# Patient Record
Sex: Female | Born: 1965 | Race: Black or African American | Hispanic: No | Marital: Married | Smoking: Never smoker
Health system: Southern US, Community
[De-identification: ages and names within clinical notes are randomized; demographics above are authoritative.]

## PROBLEM LIST (undated history)

## (undated) DIAGNOSIS — E876 Hypokalemia: Secondary | ICD-10-CM

## (undated) DIAGNOSIS — G43909 Migraine, unspecified, not intractable, without status migrainosus: Secondary | ICD-10-CM

## (undated) DIAGNOSIS — G250 Essential tremor: Secondary | ICD-10-CM

## (undated) DIAGNOSIS — E119 Type 2 diabetes mellitus without complications: Secondary | ICD-10-CM

## (undated) DIAGNOSIS — I10 Essential (primary) hypertension: Secondary | ICD-10-CM

## (undated) DIAGNOSIS — E785 Hyperlipidemia, unspecified: Secondary | ICD-10-CM

## (undated) HISTORY — PX: EXTERNAL EAR SURGERY: SHX627

## (undated) HISTORY — DX: Hyperlipidemia, unspecified: E78.5

## (undated) HISTORY — DX: Essential tremor: G25.0

## (undated) HISTORY — DX: Hypokalemia: E87.6

## (undated) HISTORY — DX: Migraine, unspecified, not intractable, without status migrainosus: G43.909

## (undated) HISTORY — DX: Type 2 diabetes mellitus without complications: E11.9

---

## 2004-10-10 ENCOUNTER — Emergency Department (HOSPITAL_COMMUNITY): Admission: EM | Admit: 2004-10-10 | Discharge: 2004-10-10 | Payer: Self-pay | Admitting: Emergency Medicine

## 2004-11-10 ENCOUNTER — Ambulatory Visit: Payer: Self-pay | Admitting: Family Medicine

## 2005-01-25 ENCOUNTER — Ambulatory Visit: Payer: Self-pay | Admitting: Family Medicine

## 2005-04-23 ENCOUNTER — Emergency Department (HOSPITAL_COMMUNITY): Admission: EM | Admit: 2005-04-23 | Discharge: 2005-04-24 | Payer: Self-pay | Admitting: Emergency Medicine

## 2005-05-19 ENCOUNTER — Ambulatory Visit: Payer: Self-pay | Admitting: Family Medicine

## 2005-11-10 ENCOUNTER — Ambulatory Visit: Payer: Self-pay | Admitting: Family Medicine

## 2005-11-23 ENCOUNTER — Ambulatory Visit: Payer: Self-pay | Admitting: Family Medicine

## 2005-12-07 ENCOUNTER — Ambulatory Visit (HOSPITAL_COMMUNITY): Admission: RE | Admit: 2005-12-07 | Discharge: 2005-12-07 | Payer: Self-pay | Admitting: Internal Medicine

## 2005-12-07 ENCOUNTER — Encounter (INDEPENDENT_AMBULATORY_CARE_PROVIDER_SITE_OTHER): Payer: Self-pay | Admitting: Cardiology

## 2005-12-15 ENCOUNTER — Ambulatory Visit: Payer: Self-pay | Admitting: Family Medicine

## 2006-01-09 ENCOUNTER — Observation Stay (HOSPITAL_COMMUNITY): Admission: EM | Admit: 2006-01-09 | Discharge: 2006-01-10 | Payer: Self-pay | Admitting: *Deleted

## 2006-01-09 ENCOUNTER — Ambulatory Visit: Payer: Self-pay | Admitting: Internal Medicine

## 2006-01-19 ENCOUNTER — Ambulatory Visit (HOSPITAL_COMMUNITY): Admission: RE | Admit: 2006-01-19 | Discharge: 2006-01-19 | Payer: Self-pay | Admitting: Cardiology

## 2006-01-29 ENCOUNTER — Emergency Department (HOSPITAL_COMMUNITY): Admission: EM | Admit: 2006-01-29 | Discharge: 2006-01-29 | Payer: Self-pay | Admitting: Emergency Medicine

## 2006-03-05 ENCOUNTER — Emergency Department (HOSPITAL_COMMUNITY): Admission: EM | Admit: 2006-03-05 | Discharge: 2006-03-05 | Payer: Self-pay | Admitting: Podiatry

## 2006-03-23 ENCOUNTER — Ambulatory Visit: Payer: Self-pay | Admitting: *Deleted

## 2006-03-23 ENCOUNTER — Encounter (INDEPENDENT_AMBULATORY_CARE_PROVIDER_SITE_OTHER): Payer: Self-pay | Admitting: *Deleted

## 2006-04-19 ENCOUNTER — Ambulatory Visit (HOSPITAL_COMMUNITY): Admission: RE | Admit: 2006-04-19 | Discharge: 2006-04-19 | Payer: Self-pay | Admitting: Internal Medicine

## 2006-05-12 ENCOUNTER — Ambulatory Visit: Payer: Self-pay | Admitting: Obstetrics & Gynecology

## 2006-10-25 ENCOUNTER — Emergency Department (HOSPITAL_COMMUNITY): Admission: EM | Admit: 2006-10-25 | Discharge: 2006-10-25 | Payer: Self-pay | Admitting: Emergency Medicine

## 2006-11-25 ENCOUNTER — Emergency Department (HOSPITAL_COMMUNITY): Admission: EM | Admit: 2006-11-25 | Discharge: 2006-11-26 | Payer: Self-pay | Admitting: Emergency Medicine

## 2006-12-22 ENCOUNTER — Emergency Department (HOSPITAL_COMMUNITY): Admission: EM | Admit: 2006-12-22 | Discharge: 2006-12-22 | Payer: Self-pay | Admitting: Emergency Medicine

## 2007-03-14 ENCOUNTER — Emergency Department (HOSPITAL_COMMUNITY): Admission: EM | Admit: 2007-03-14 | Discharge: 2007-03-14 | Payer: Self-pay | Admitting: Emergency Medicine

## 2007-03-28 ENCOUNTER — Emergency Department (HOSPITAL_COMMUNITY): Admission: EM | Admit: 2007-03-28 | Discharge: 2007-03-28 | Payer: Self-pay | Admitting: Emergency Medicine

## 2007-05-02 ENCOUNTER — Ambulatory Visit: Payer: Self-pay | Admitting: Family Medicine

## 2007-08-29 DIAGNOSIS — R519 Headache, unspecified: Secondary | ICD-10-CM | POA: Insufficient documentation

## 2007-08-29 DIAGNOSIS — I10 Essential (primary) hypertension: Secondary | ICD-10-CM | POA: Insufficient documentation

## 2007-08-29 DIAGNOSIS — K219 Gastro-esophageal reflux disease without esophagitis: Secondary | ICD-10-CM | POA: Insufficient documentation

## 2007-08-29 DIAGNOSIS — R51 Headache: Secondary | ICD-10-CM

## 2007-09-09 ENCOUNTER — Emergency Department (HOSPITAL_COMMUNITY): Admission: EM | Admit: 2007-09-09 | Discharge: 2007-09-09 | Payer: Self-pay | Admitting: Emergency Medicine

## 2007-10-18 ENCOUNTER — Ambulatory Visit: Payer: Self-pay | Admitting: Internal Medicine

## 2008-01-06 ENCOUNTER — Emergency Department (HOSPITAL_COMMUNITY): Admission: EM | Admit: 2008-01-06 | Discharge: 2008-01-06 | Payer: Self-pay | Admitting: Emergency Medicine

## 2008-02-01 ENCOUNTER — Emergency Department (HOSPITAL_COMMUNITY): Admission: EM | Admit: 2008-02-01 | Discharge: 2008-02-01 | Payer: Self-pay | Admitting: Emergency Medicine

## 2008-03-18 ENCOUNTER — Ambulatory Visit: Payer: Self-pay | Admitting: Internal Medicine

## 2008-04-25 ENCOUNTER — Ambulatory Visit: Payer: Self-pay | Admitting: Internal Medicine

## 2008-04-25 ENCOUNTER — Encounter (INDEPENDENT_AMBULATORY_CARE_PROVIDER_SITE_OTHER): Payer: Self-pay | Admitting: Family Medicine

## 2008-04-25 LAB — CONVERTED CEMR LAB

## 2008-05-27 ENCOUNTER — Ambulatory Visit: Payer: Self-pay | Admitting: Internal Medicine

## 2008-05-28 ENCOUNTER — Encounter: Payer: Self-pay | Admitting: Internal Medicine

## 2008-06-29 ENCOUNTER — Emergency Department (HOSPITAL_COMMUNITY): Admission: EM | Admit: 2008-06-29 | Discharge: 2008-06-29 | Payer: Self-pay | Admitting: Emergency Medicine

## 2008-09-10 ENCOUNTER — Ambulatory Visit: Payer: Self-pay | Admitting: Internal Medicine

## 2008-09-12 ENCOUNTER — Ambulatory Visit: Payer: Self-pay | Admitting: *Deleted

## 2008-10-14 ENCOUNTER — Ambulatory Visit: Payer: Self-pay | Admitting: Internal Medicine

## 2008-10-16 ENCOUNTER — Ambulatory Visit (HOSPITAL_COMMUNITY): Admission: RE | Admit: 2008-10-16 | Discharge: 2008-10-16 | Payer: Self-pay | Admitting: Internal Medicine

## 2008-10-31 IMAGING — CT CT PELVIS W/ CM
1 of 3 series · 14 of 32 positions shown, 19 images · IV contrast (OMNI 300/WATER & 100 ML OMNI 300)
Comparison: Pelvic ultrasound of 09/09/07.

CLINICAL DATA: Right lower quadrant abdominal pain.
 ABDOMEN CT WITH CONTRAST:
TECHNIQUE: Multidetector CT imaging of the abdomen was performed following the standard protocol during bolus administration of intravenous contrast.
 Contrast:  100 cc Omnipaque 300 and oral contrast.
TECHNIQUE: Multidetector CT imaging of the pelvis was performed following the standard protocol during bolus administration of intravenous contrast.

[Series 2: routine abdomen · axial · 0.90mm/px · z∈[-378,+56]mm · 14 of 97 slices shown, 19 images]
[im 5/97  soft-tissue]
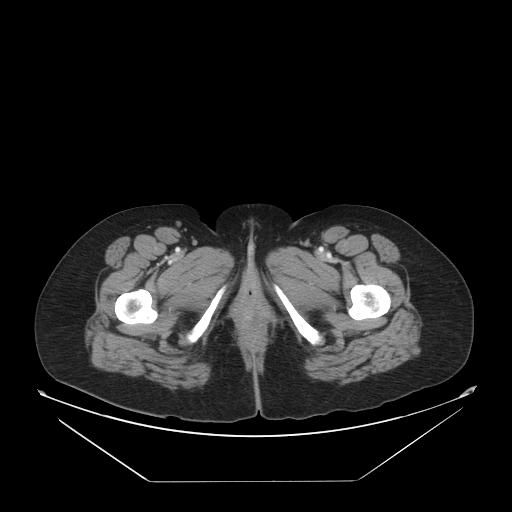
[im 5/97  bone]
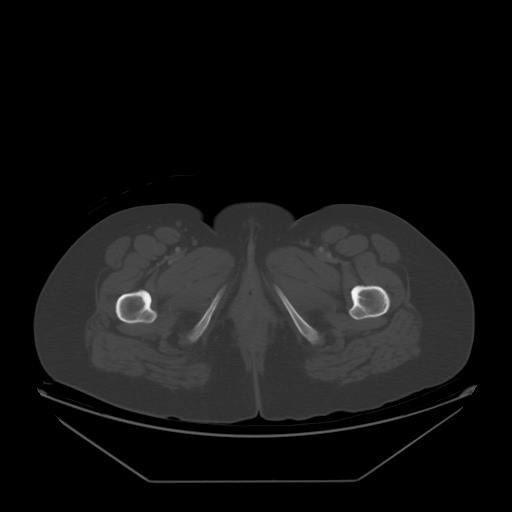
[im 15/97  soft-tissue]
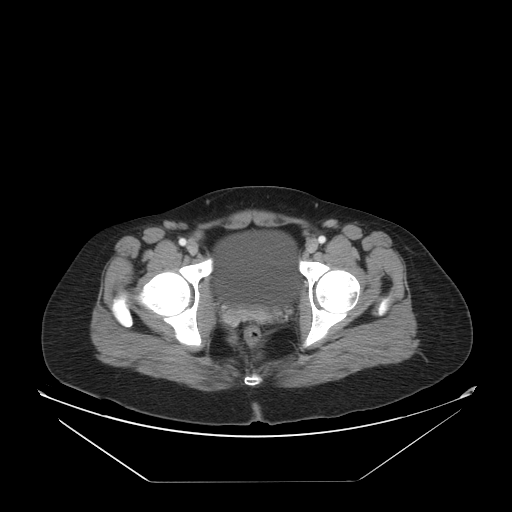
[im 20/97  soft-tissue]
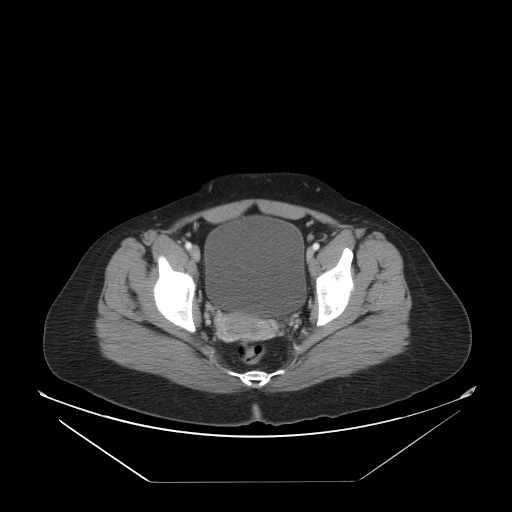
[im 29/97  soft-tissue]
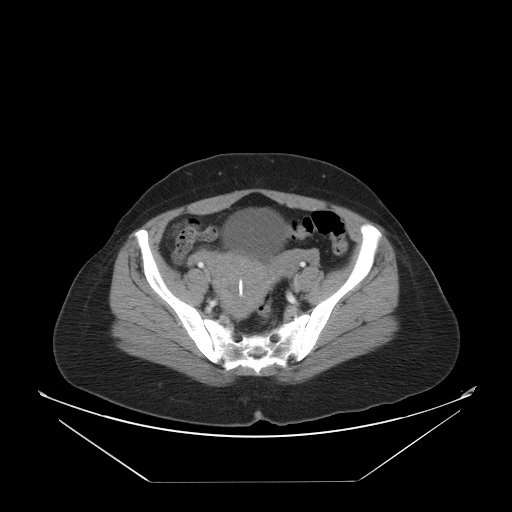
[im 34/97  soft-tissue]
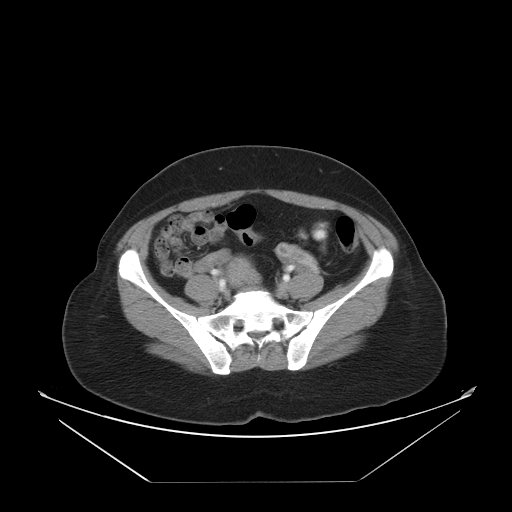
[im 44/97  soft-tissue]
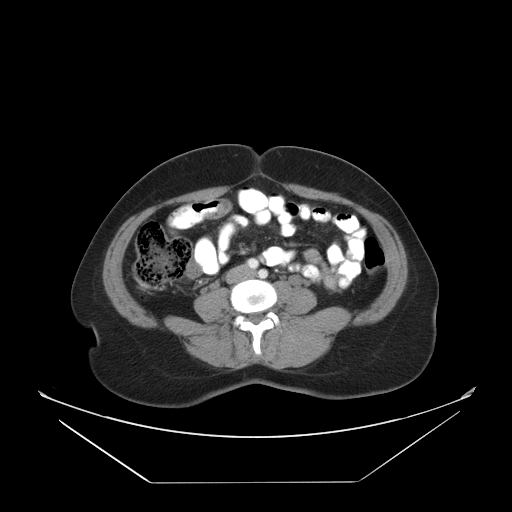
[im 49/97  soft-tissue]
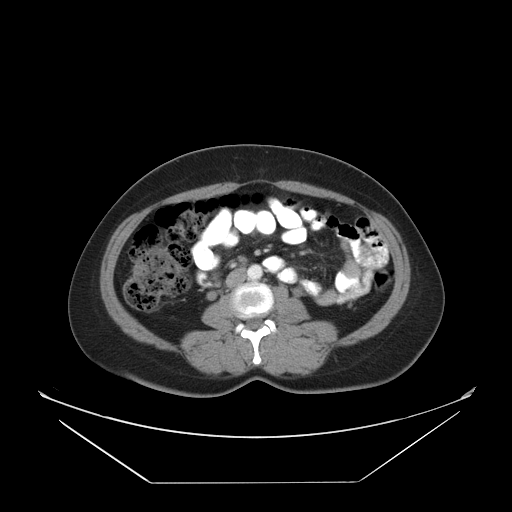
[im 53/97  soft-tissue]
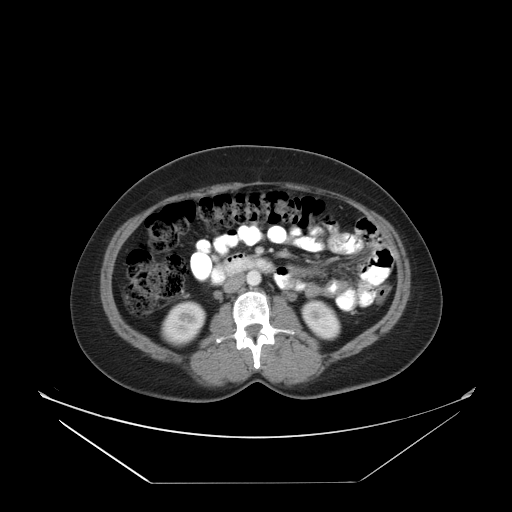
[im 63/97  soft-tissue]
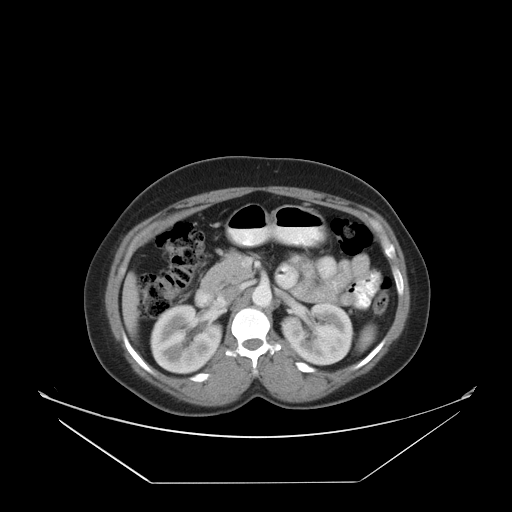
[im 63/97  bone]
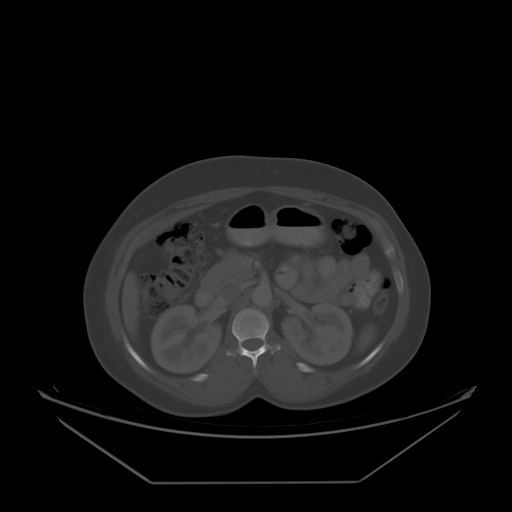
[im 68/97  soft-tissue]
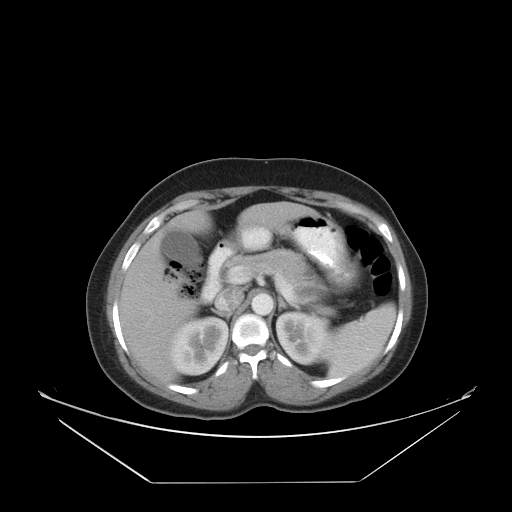
[im 77/97  soft-tissue]
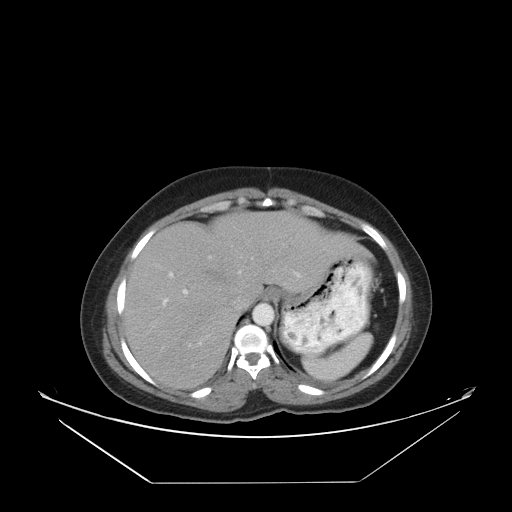
[im 77/97  lung]
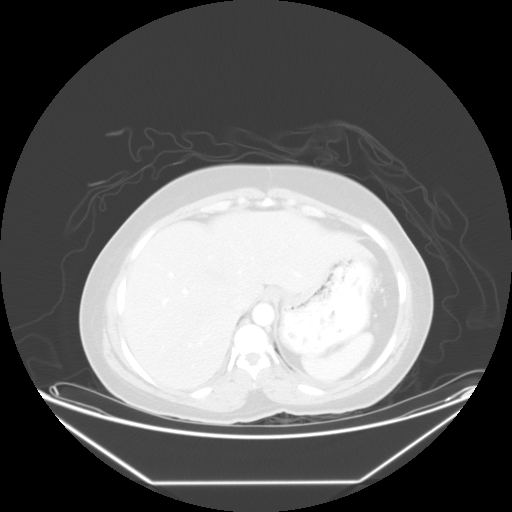
[im 82/97  soft-tissue]
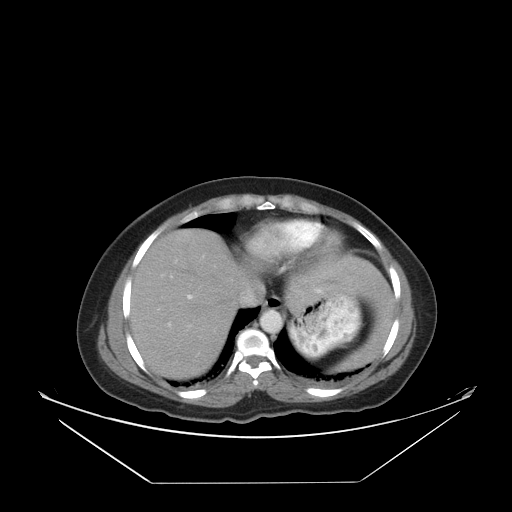
[im 82/97  lung]
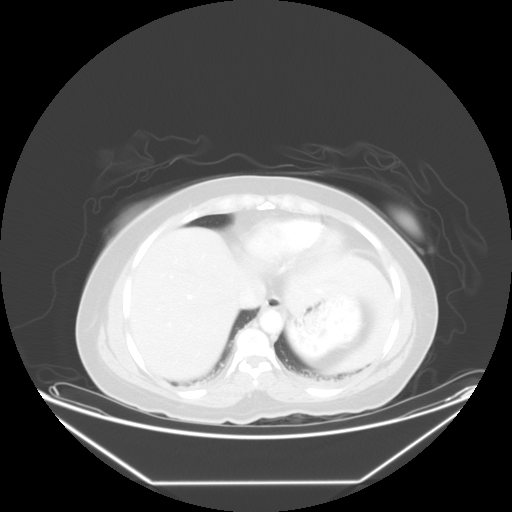
[im 87/97  lung]
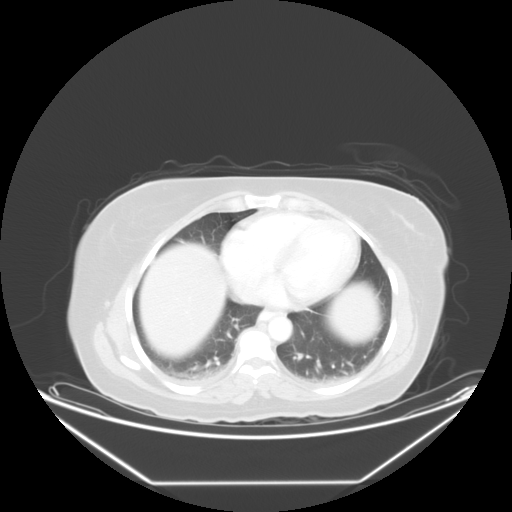
[im 92/97  soft-tissue]
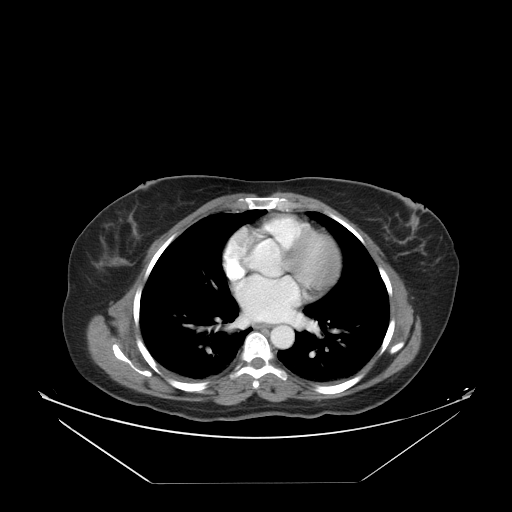
[im 92/97  lung]
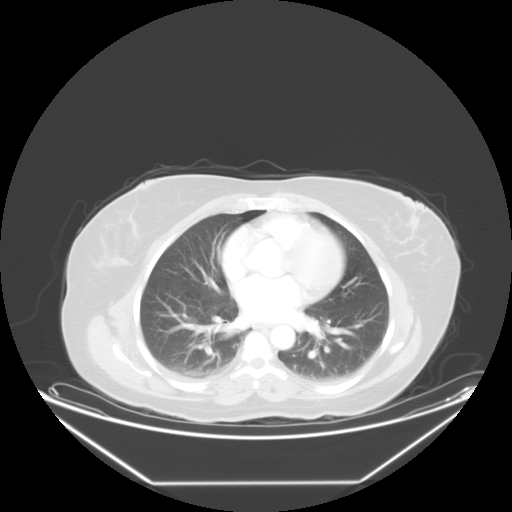

[14 of 32 positions shown; findings below may reference images not displayed]

FINDINGS: Dependent bibasilar atelectasis is noted.  A 3 mm, too small to characterize, left upper renal pole cortical hypodensity is noted.  Abdominal viscera are otherwise unremarkable.
IMPRESSION: 1. Bibasilar atelectasis. 
 2. No acute intraabdominal finding.  
 PELVIS CT WITH CONTRAST:
FINDINGS: The appendix is normal.  An IUD is in place.  The uterus and ovaries are otherwise unremarkable.  Colon and small bowel are unremarkable.   No acute osseous finding.
IMPRESSION: No acute intrapelvic finding.

## 2008-10-31 IMAGING — US US TRANSVAGINAL NON-OB
1 series · 14 of 25 positions shown · non-contrast
Comparison: none

CLINICAL DATA: Abdominal pain and right lower quadrant pelvic pain. Prior IUD placement.
 TRANSABDOMINAL AND TRANSVAGINAL PELVIC ULTRASOUND:
TECHNIQUE: Both transabdominal and transvaginal ultrasound examinations of the pelvis were performed including evaluation of the uterus, ovaries, adnexal regions, and pelvic cul-de-sac.

[Series 1: unknown · 0.32mm/px · 14 of 41 slices shown]
[im 1/41]
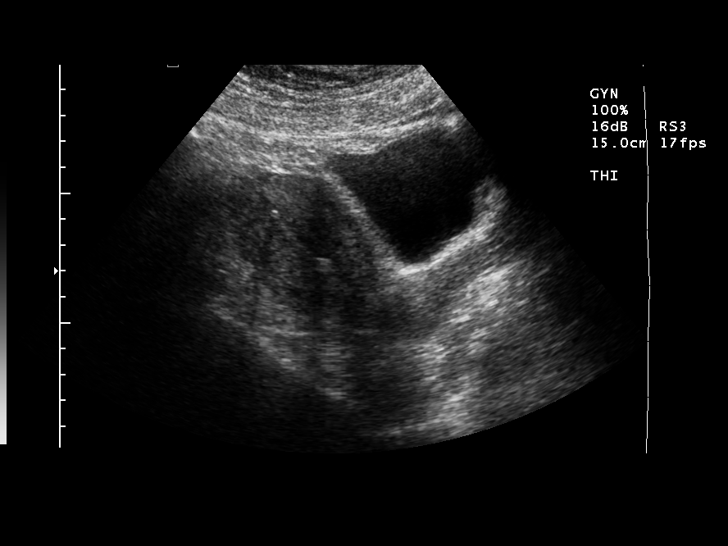
[im 4/41]
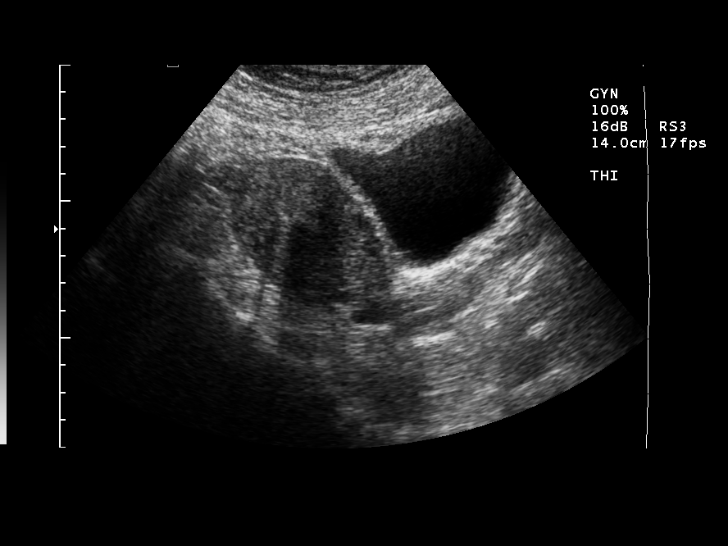
[im 7/41]
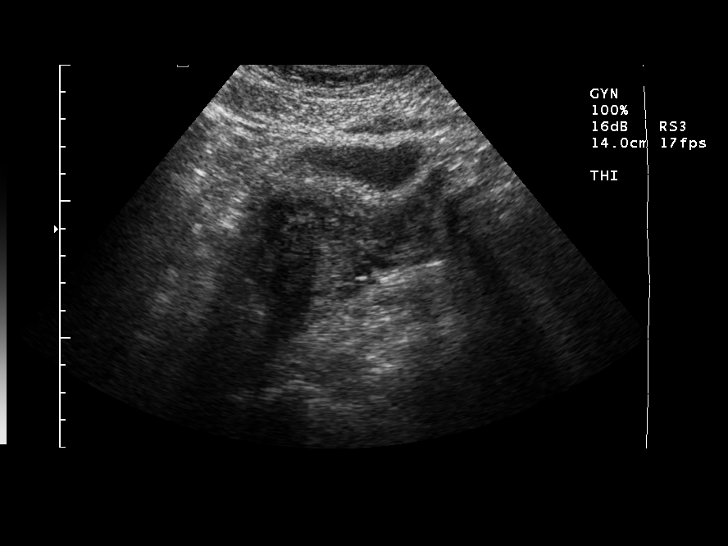
[im 11/41]
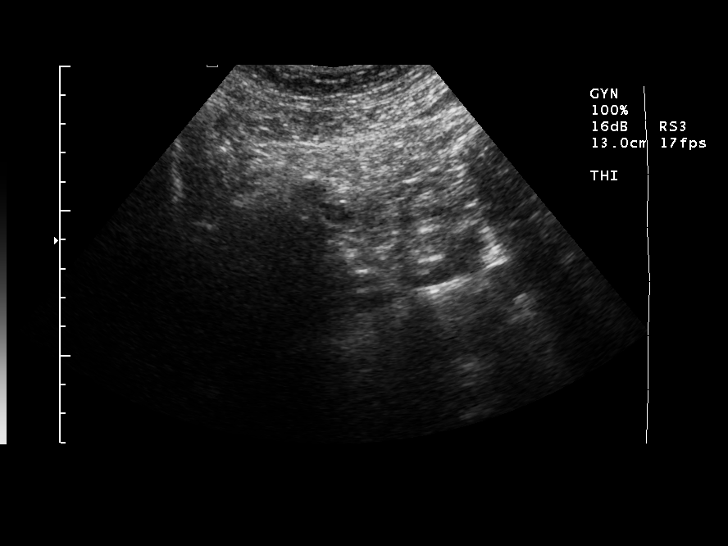
[im 14/41]
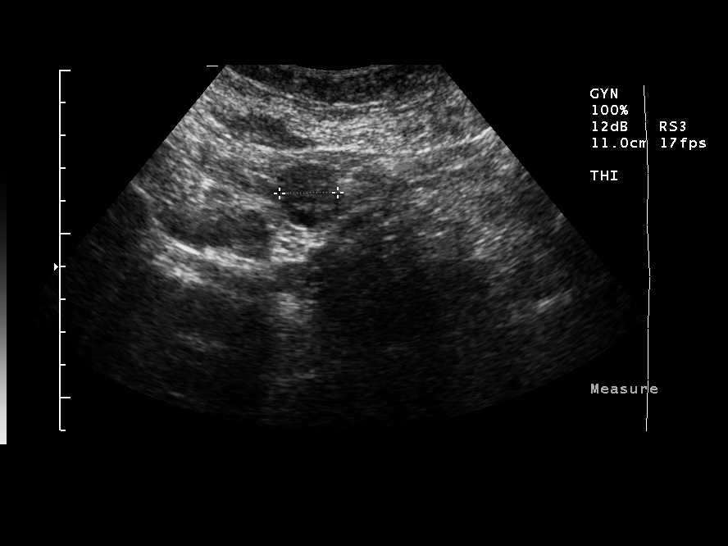
[im 16/41]
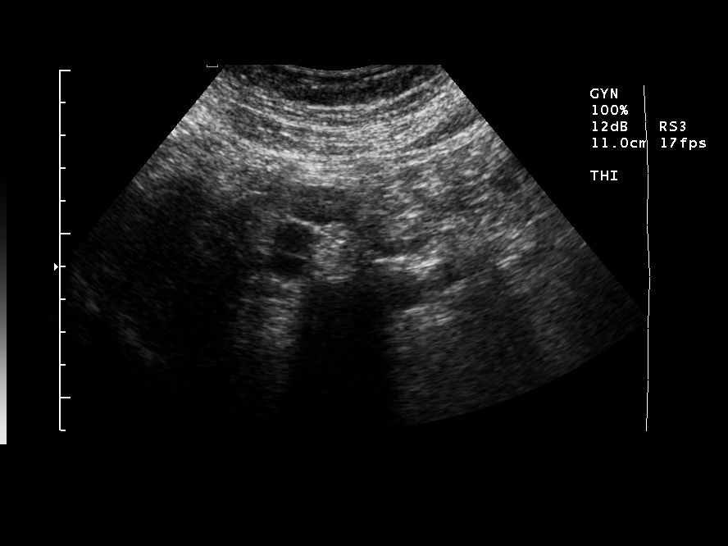
[im 19/41]
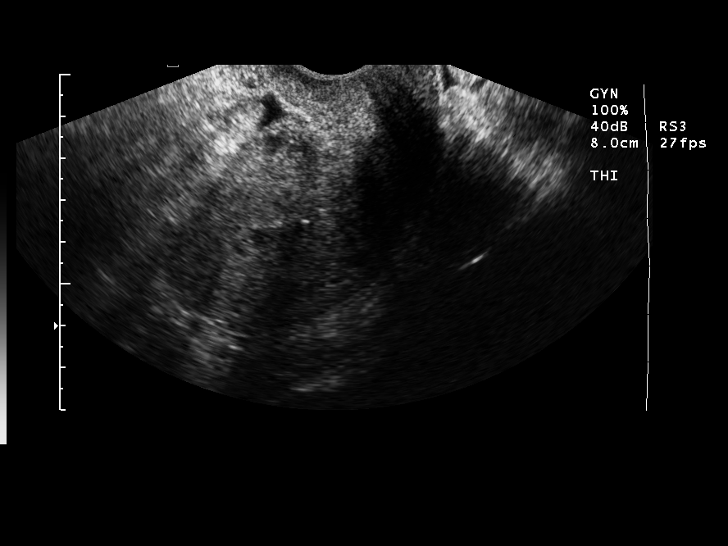
[im 22/41]
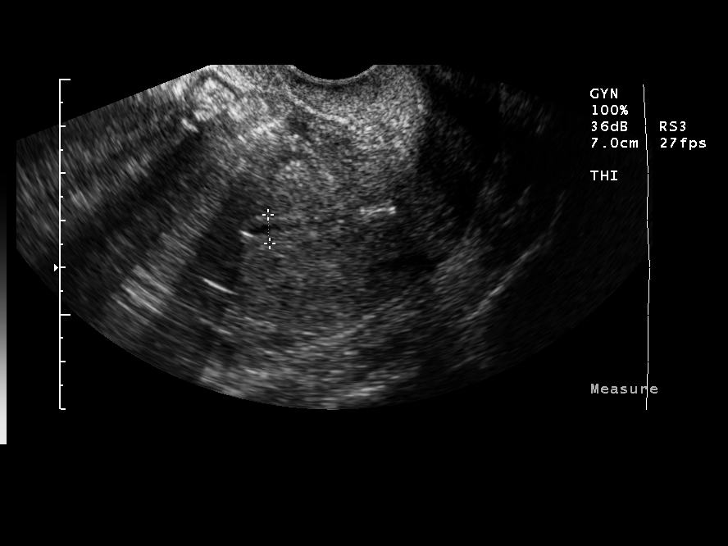
[im 26/41]
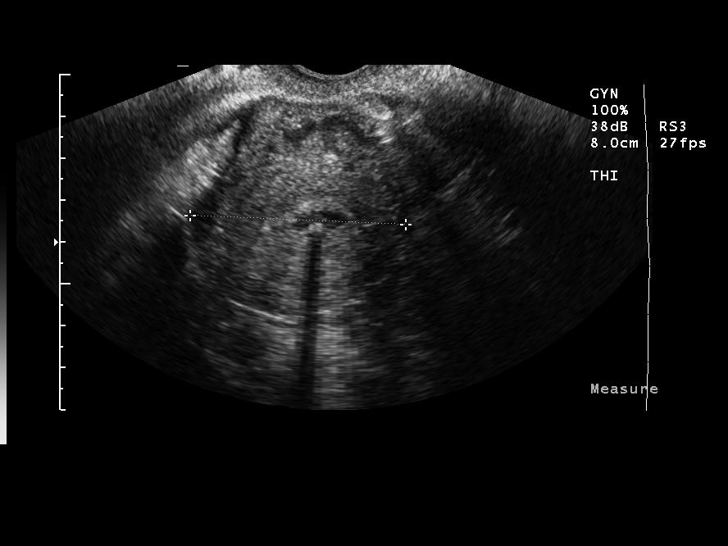
[im 27/41]
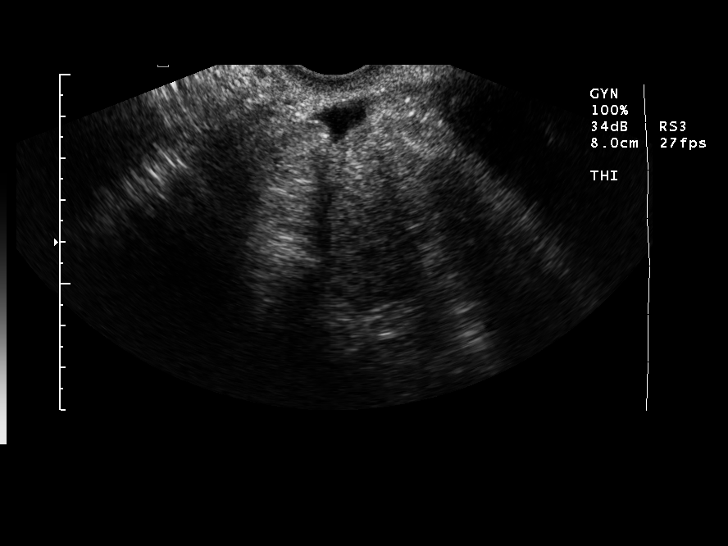
[im 31/41]
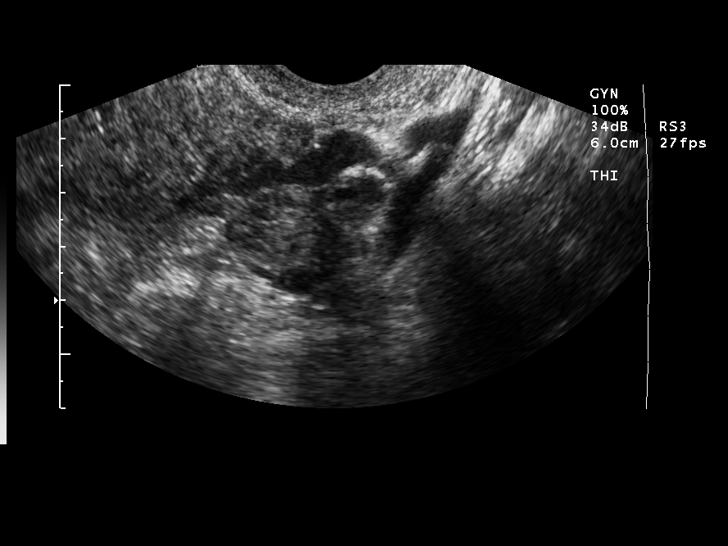
[im 34/41]
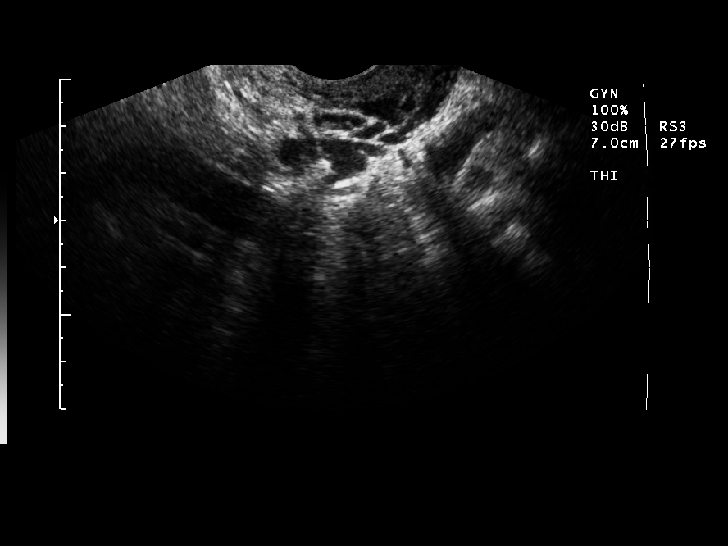
[im 37/41]
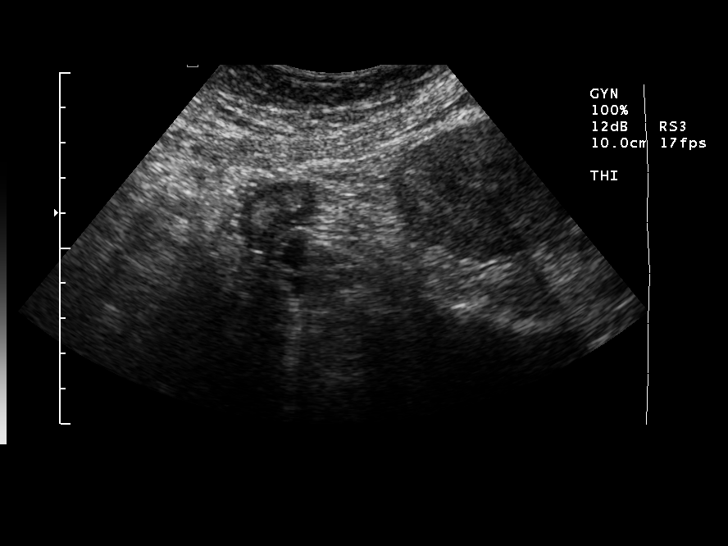
[im 41/41]
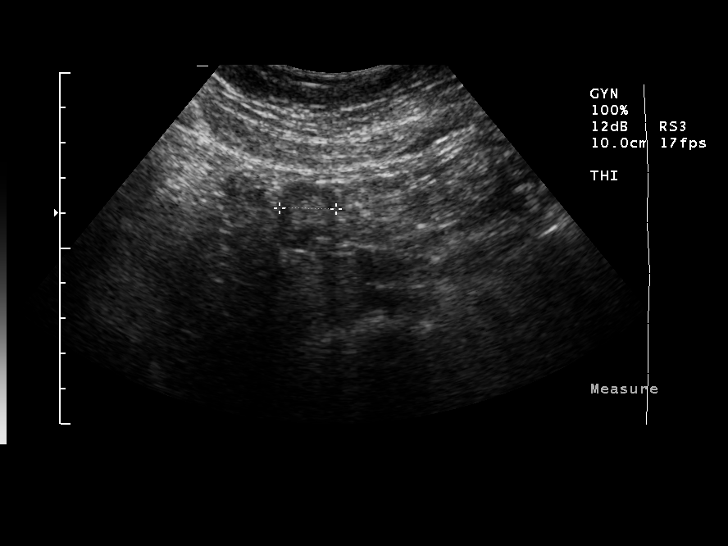

[14 of 25 positions shown; findings below may reference images not displayed]

FINDINGS: The uterus is anteverted and anteflexed.  An IUD is in place within the fundus of the uterus, endometrial canal.  Endometrial stripe measures 4 mm.  The ovaries are normal. 
 Small amount of free fluid noted.
IMPRESSION: No acute intrapelvic finding. IUD appropriately positioned.

## 2008-11-04 ENCOUNTER — Ambulatory Visit: Payer: Self-pay | Admitting: Internal Medicine

## 2008-11-06 ENCOUNTER — Ambulatory Visit: Payer: Self-pay | Admitting: Internal Medicine

## 2008-11-11 ENCOUNTER — Ambulatory Visit: Payer: Self-pay | Admitting: Internal Medicine

## 2008-11-18 ENCOUNTER — Ambulatory Visit: Payer: Self-pay | Admitting: Internal Medicine

## 2008-11-18 ENCOUNTER — Encounter: Payer: Self-pay | Admitting: Family Medicine

## 2008-11-18 LAB — CONVERTED CEMR LAB
AST: 11 units/L (ref 0–37)
Alkaline Phosphatase: 57 units/L (ref 39–117)
BUN: 17 mg/dL (ref 6–23)
Basophils Relative: 0 % (ref 0–1)
C3 Complement: 152 mg/dL (ref 88–201)
Creatinine, Ser: 0.54 mg/dL (ref 0.40–1.20)
Creatinine, Urine: 87.2 mg/dL
Eosinophils Absolute: 0.1 10*3/uL (ref 0.0–0.7)
HDL: 44 mg/dL (ref >39)
Hemoglobin: 13.6 g/dL (ref 12.0–15.0)
LDL Cholesterol: 117 mg/dL — ABNORMAL HIGH (ref 0–99)
MCHC: 32 g/dL (ref 30.0–36.0)
MCV: 86.4 fL (ref 78.0–100.0)
Monocytes Absolute: 0.4 10*3/uL (ref 0.1–1.0)
Monocytes Relative: 8 % (ref 3–12)
Protein, Ur: 60 mg/24hr (ref 50–100)
RBC: 4.92 M/uL (ref 3.87–5.11)
Total CHOL/HDL Ratio: 4

## 2008-12-02 ENCOUNTER — Ambulatory Visit: Payer: Self-pay | Admitting: Internal Medicine

## 2009-01-23 ENCOUNTER — Emergency Department (HOSPITAL_COMMUNITY): Admission: EM | Admit: 2009-01-23 | Discharge: 2009-01-23 | Payer: Self-pay | Admitting: Emergency Medicine

## 2009-02-18 ENCOUNTER — Ambulatory Visit: Payer: Self-pay | Admitting: Family Medicine

## 2009-02-18 LAB — CONVERTED CEMR LAB
ALT: 11 units/L (ref 0–35)
AST: 12 units/L (ref 0–37)
CO2: 23 meq/L (ref 19–32)
Chloride: 105 meq/L (ref 96–112)
Cholesterol: 156 mg/dL (ref 0–200)
Pro B Natriuretic peptide (BNP): 3.5 pg/mL (ref 0.0–100.0)
Sodium: 143 meq/L (ref 135–145)
Total Bilirubin: 0.3 mg/dL (ref 0.3–1.2)
Total Protein: 7.3 g/dL (ref 6.0–8.3)
VLDL: 16 mg/dL (ref 0–40)

## 2009-03-03 ENCOUNTER — Ambulatory Visit: Payer: Self-pay | Admitting: Family Medicine

## 2009-05-12 ENCOUNTER — Encounter: Payer: Self-pay | Admitting: Internal Medicine

## 2009-05-12 ENCOUNTER — Ambulatory Visit: Payer: Self-pay | Admitting: Internal Medicine

## 2009-05-12 DIAGNOSIS — K1120 Sialoadenitis, unspecified: Secondary | ICD-10-CM | POA: Insufficient documentation

## 2009-05-23 ENCOUNTER — Encounter (INDEPENDENT_AMBULATORY_CARE_PROVIDER_SITE_OTHER): Payer: Self-pay | Admitting: Family Medicine

## 2009-05-23 ENCOUNTER — Ambulatory Visit: Payer: Self-pay | Admitting: Internal Medicine

## 2009-05-26 ENCOUNTER — Ambulatory Visit (HOSPITAL_COMMUNITY): Admission: RE | Admit: 2009-05-26 | Discharge: 2009-05-26 | Payer: Self-pay | Admitting: Family Medicine

## 2009-11-24 ENCOUNTER — Ambulatory Visit: Payer: Self-pay | Admitting: Internal Medicine

## 2009-11-25 ENCOUNTER — Encounter (INDEPENDENT_AMBULATORY_CARE_PROVIDER_SITE_OTHER): Payer: Self-pay | Admitting: Internal Medicine

## 2009-11-27 ENCOUNTER — Encounter: Admission: RE | Admit: 2009-11-27 | Discharge: 2009-11-27 | Payer: Self-pay | Admitting: Internal Medicine

## 2009-12-04 ENCOUNTER — Encounter: Payer: Self-pay | Admitting: Internal Medicine

## 2009-12-04 ENCOUNTER — Ambulatory Visit (HOSPITAL_COMMUNITY): Admission: RE | Admit: 2009-12-04 | Discharge: 2009-12-04 | Payer: Self-pay | Admitting: Internal Medicine

## 2010-06-05 ENCOUNTER — Ambulatory Visit: Payer: Self-pay | Admitting: Internal Medicine

## 2010-11-23 ENCOUNTER — Emergency Department (HOSPITAL_COMMUNITY)
Admission: EM | Admit: 2010-11-23 | Discharge: 2010-11-23 | Payer: Self-pay | Source: Home / Self Care | Admitting: Family Medicine

## 2011-01-10 ENCOUNTER — Encounter: Payer: Self-pay | Admitting: Internal Medicine

## 2011-03-22 ENCOUNTER — Ambulatory Visit (HOSPITAL_COMMUNITY)
Admission: RE | Admit: 2011-03-22 | Discharge: 2011-03-22 | Disposition: A | Discharge status: Home / Self Care | Payer: Self-pay | Source: Ambulatory Visit | Attending: Family Medicine | Admitting: Family Medicine

## 2011-03-22 ENCOUNTER — Other Ambulatory Visit (HOSPITAL_COMMUNITY): Payer: Self-pay | Admitting: Family Medicine

## 2011-03-22 DIAGNOSIS — R52 Pain, unspecified: Secondary | ICD-10-CM

## 2011-03-22 DIAGNOSIS — M25519 Pain in unspecified shoulder: Secondary | ICD-10-CM | POA: Insufficient documentation

## 2011-03-24 ENCOUNTER — Other Ambulatory Visit (HOSPITAL_COMMUNITY): Payer: Self-pay | Admitting: Family Medicine

## 2011-03-24 DIAGNOSIS — M25511 Pain in right shoulder: Secondary | ICD-10-CM

## 2011-03-26 ENCOUNTER — Ambulatory Visit (HOSPITAL_COMMUNITY)
Admission: RE | Admit: 2011-03-26 | Discharge: 2011-03-26 | Disposition: A | Discharge status: Home / Self Care | Payer: Self-pay | Source: Ambulatory Visit | Attending: Family Medicine | Admitting: Family Medicine

## 2011-03-26 DIAGNOSIS — M25511 Pain in right shoulder: Secondary | ICD-10-CM

## 2011-03-26 DIAGNOSIS — M719 Bursopathy, unspecified: Secondary | ICD-10-CM | POA: Insufficient documentation

## 2011-03-26 DIAGNOSIS — M25519 Pain in unspecified shoulder: Secondary | ICD-10-CM | POA: Insufficient documentation

## 2011-03-26 DIAGNOSIS — M67919 Unspecified disorder of synovium and tendon, unspecified shoulder: Secondary | ICD-10-CM | POA: Insufficient documentation

## 2011-05-07 NOTE — Group Therapy Note (Signed)
NAMEKRISTYN, Ashley Huynh.:  [id number]   MEDICAL RECORD NO.:  [id number]          PATIENT TYPE:  WOC   LOCATION:  [facility]                   FACILITY:  [facility]   PHYSICIAN:  Ellis Parents, MD    DATE OF BIRTH:  [birth year]   DATE OF SERVICE:                                    CLINIC NOTE   This 45 year old gravida 3, para 3, comes in for follow-up from an MAU visit  on March 17.  She went into MAU at that time for a chief complaint of right  back pain.  Evaluation was completely negative, including a CT scan of the  abdomen and of the pelvis, except the pelvic CT showed a complex ovarian  cyst on the right side measuring 2.5 cm.  Urinalysis was negative.  The  patient was discharged from MAU with ibuprofen, and she returns here for  follow-up.  The patient via interpreter (her daughter) states that she has  no further pain.  The patient has an IUD which has been in place for  approximately eight months, which was inserted in [place].   PHYSICAL EXAMINATION:  Pelvic:  External genitalia are normal.  The vagina  contains a minimal amount of menstrual blood.  An IUD thread is visible  projecting from the cervix approximately 2-3 cm.  The cervix is clean.  Uterus is in midposition, normal size.  Both adnexa are soft and nontender.   A follow-up pelvic ultrasound is scheduled for approximately around May 1.  A Pap smear is taken.  The patient is discharged and to be seen on a p.r.n.  basis.           ______________________________  Ellis Parents, MD     SA/MEDQ  D:  03/23/2006  T:  03/24/2006  Job:  [id number]

## 2011-05-07 NOTE — Discharge Summary (Signed)
NAMEKARALYN, KADEL NO.:  [id number]   MEDICAL RECORD NO.:  [id number]          PATIENT TYPE:  OBV   LOCATION:  2031                         FACILITY:  [facility]   PHYSICIAN:  Lacretia Leigh. Hatcher, M.D.DATE OF BIRTH:  July 26, 1966   DATE OF ADMISSION:  01/09/2006  DATE OF DISCHARGE:  01/10/2006                                 DISCHARGE SUMMARY   DISCHARGE DIAGNOSES:  1.  Uncontrolled hypertension.  2.  Atypical chest pain with shortness of breath.  3.  Chronic headaches.  4.  Hypokalemia.  5.  Incidentally found chronic sinusitis with large retention cyst in the      right maxillary sinus.   DISCHARGE MEDICATIONS:  1.  Atenolol 50 mg p.o. daily.  2.  Hydrochlorothiazide 25 mg p.o. daily.  3.  Aspirin 81 mg p.o. daily.  4.  Tylenol 325 mg one to two tablets p.o. q. 4-6 hours p.r.n.   CONDITION AT DISCHARGE:  Stable and improved.   FOLLOW UP:  The patient was instructed to follow up with Fannie Knee Drinkard at  [facility] on January 17, 2006, at 3 p.m..  The patient was also instructed  to follow up with Dr. Sharyn Lull on January 14, 2006, at 3 p.m. for  consideration for an outpatient stress test to follow up on her atypical  chest pain with shortness of breath.   PROCEDURES:  1.  Computed tomography of the head without contrast medium on January 09, 2006, was negative.  2.  Chest x-ray on January 09, 2006, showed no active cardiopulmonary      disease.  3.  Magnetic resonance imaging of the brain on January 10, 2006, showed no      acute stroke or changes of eclampsia/preeclampsia.  No evidence for      venous occlusive disease or pituitary enlargement/apoplexy.  Chronic      sinusitis with large retention cyst in the right maxillary sinus.   CONSULTATIONS:  None.   HISTORY OF PRESENT ILLNESS:  Please see the chart for full details of Ms.  Kleinman's admission H&P.  In summary, she is a 45 year old, [place], Arabic-  speaking woman who presents with a past  medical history of hypertension  secondary to pregnancy (preeclampsia), chronic headache who presents with a  complaint of headache and shortness of breath.  Ms. Fawley states 5 days  prior to admission she started experiencing chest pain whenever she bent  over.  She had no pain when sitting up or with exertion.  She complains of  shortness of breath plus headache x1 day.  She states that the headache is  from her hypertension.  No nausea, vomiting, shortness of breath,  palpitations light sensitivity with chest pain or headache.  Shortness of  breath occurs when lying down and describes paroxysmal nocturnal dyspnea.  She feels like she cannot get air and runs to the door to open it and  breathe air.  She describes her chest pain as mid epigastric, stabbing  without radiation and feels a hard spot when she gets pain in the area of  her  chest.  While she is mainly Arabic-speaking, she does speak some  [place].   PHYSICAL EXAMINATION:  VITAL SIGNS:  Temperature 96.8, blood pressure  161/113, pulse 89, respiration rate 20, oxygen saturation 100% on room air.  GENERAL:  The patient was in no acute distress eating lunch when she was  seen in the ED.  NECK:  Her neck was supple with no lymphadenopathy.  No JVD.  LUNGS:  Lungs were clear to auscultation bilaterally.  There is a question  of a systolic ejection murmur with radiation to the axilla versus prominent  heart sounds, but no rubs, gallops or clicks were noted.  ABDOMEN:  Abdomen was soft, nontender, nondistended.  Bowel sounds were  present.  There was some CVA tenderness on the left.  EXTREMITIES:  Showed no edema.  PELVIC:  The patient was currently menstruating.  NEUROLOGIC:  Intact.  Psychiatric was appropriate.   LABORATORY DATA AND X-RAY FINDINGS:  Labs on admission include sodium of  138, potassium 3.2, chloride 103, bicarb 27, glucose 90, BUN 8, creatinine  0.5.  Total bilirubin 0.5, Alk phos 63, AST 18, ALT 13, total  protein 7.1,  albumin 3.7, calcium 8.7.  White blood cell count 6.9, hemoglobin 13.5,  hematocrit 39.6, MCV 85.2, platelets 274.  Point of care markers were  negative x3.  Cardiac enzymes were cycled and were negative x3.  TSH was  0.698.  Lipase 22.  D-dimer less than 0.22.  A urine pregnancy test was  negative.  UA was positive for large blood with 0-2 wbc's and 21-50 rbc's  with few bacteria.  Once again, the patient was menstruating.   HOSPITAL COURSE:  Problem 1.  ATYPICAL CHEST PAIN AND SHORTNESS OF BREATH:  This is in the setting of a patient with uncontrolled hypertension and  headache.  The patient's EKG did have some abnormalities noted including  flipped T-waves in the precordial region, so she was admitted to rule out an  acute MI versus acute coronary syndrome.  She is a young female with no  other risk factors, but she does have hypertension that was currently  uncontrolled with a history of shortness of breath and questionable PND.  Secondary to her sudden onset of shortness of breath with a normal chest x-  ray and her tachycardia, a PE was on the differential as well, but was ruled  out with a negative D-dimer.  She was also not hypoxic.  An acute MI was  ruled out with serial enzymes being negative x3 and cardiology was not  consulted during this hospitalization.  The patient had recently had a 2D  echocardiogram in December 2006, which showed ejection fraction of 65-75%,  but the study was inadequate for left ventricular regional wall motion  evaluation.  Because of her atypical chest pain with some questionable  changes on her EKG with inadequate 2D echocardiogram, we decided to set her  up to see a cardiologist as an outpatient for a stress test.  Dr. Sharyn Lull  was notified and an appointment was scheduled for the patient to be seen  within a few days of being discharged.  On the day of discharge after the patient's blood pressure was brought under better control, the  patient  stated that she felt much better and was not having any more chest pain or  shortness of breath and even her headache had improved.  On the day of  discharge, her blood pressure was 127/78.  In light of this, it was  felt to  be safe to discharge the patient with close follow-up with cardiologist and  her primary care physician at [facility].   Problem 2.  UNCONTROLLED HYPERTENSION:  When the patient was admitted, she  stated that she was taking only Atenolol/ chlorthalidone 50/25 one p.o.  daily which either she was not taking or was not adequately controlling her  blood pressure, as on the day of admission, her blood pressure was 161/113.  Later, she did reveal that she was not taking her medication regularly  because she felt that it was adding to her symptoms instead of controlling  her symptoms of a headache.  She did admit during the hospitalization,  however, that once we got her blood pressure under control her headache went  away and agreed to take Atenolol and hydrochlorothiazide as an outpatient.  Because of financial constraints, we felt that it would be more feasible for  the patient to obtain Atenolol and hydrochlorothiazide, so we advised the  patient to take Atenolol 50 mg p.o. daily and hydrochlorothiazide 25 mg p.o.  daily.  Samples were provided as both the Atenolol and hydrochlorothiazide  from the outpatient clinic.  Both of those medications were available for  four dollars from [facility] as well.   Problem 3.  CHRONIC HEADACHE:  This may be secondary to the larger tension  cyst noted in her right maxillary sinus with evidence of chronic sinusitis  noted on the MRI done during this hospitalization.  CT and an MRI and were  done and the only thing found is listed above with the chronic sinusitis  with a large retention cyst in the right maxillary sinus.  The patient was  counseled on headache prophylaxis and treatment and was advised to take  Tylenol as  needed for her headaches.  While the patient did feel better  regarding her headache on the day of discharge, once her blood pressure was  under control, it is not likely that was the source of her headache.  The  patient was not discharged with any medications regarding her chronic  sinusitis.  If her headaches worsen, this might be explored further with a  referral to an ENT physician.   Problem 4.  HYPERTENSION:  This is addressed above.  In brief, the patient  was discharged on Atenolol 50 mg p.o. daily and hydrochlorothiazide 25 mg  p.o. daily which were keeping her blood pressure under very good control as  her blood pressure on discharge was 127/78.   DISCHARGE LABORATORY DATA AND X-RAY FINDINGS:  On the day of discharge, her  sodium was 141, potassium 3.7, chloride 107, bicarb 29, glucose 119, BUN 11,  creatinine 0.6, calcium 8.5.  White blood cell count 4.1, hemoglobin 12.8,  hematocrit 36.5, MCV 85.4, platelets 259.  DISCHARGE PHYSICAL EXAMINATION:  VITAL SIGNS:  Temperature was 98.6, blood  pressure 127/78, pulse 90, respiration rate 18 and she was saturating at 98%  on room air.      Chauncey Reading, D.O.    ______________________________  Lacretia Leigh. Ninetta Lights, M.D.    EA/MEDQ  D:  04/22/2006  T:  04/23/2006  Job:  [id number]   cc:   [facility]/Sue Drinkard

## 2011-05-07 NOTE — Group Therapy Note (Signed)
Ashley Huynh, BLACKSTOCK.:  [id number]   MEDICAL RECORD NO.:  [id number]          PATIENT TYPE:  WOC   LOCATION:  [facility]                   FACILITY:  [facility]   PHYSICIAN:  Dorthula Perfect, MD     DATE OF BIRTH:  27-Jun-1966   DATE OF SERVICE:  05/12/2006                                    CLINIC NOTE   This 45 year old para 3 returns for followup with a history of an ovarian  cyst.  Details are in the last clinic note.  She was seen here approximately  March 23, 2006.  Her pelvic examination was described as normal at that time.  A repeat ultrasound was obtained.  This was done Apr 19, 2006.  The  ultrasound showed a normal uterus, and both ovaries were normal.  In  addition, the patient's IUD was in a normal position.  Her menstrual periods  are irregularly irregular because of the IUD.  They are light.  They cause  her no discomfort.  She has no pelvic pain.   PHYSICAL EXAMINATION:  PELVIC:  External genitalia was normal.  The vaginal  vault was negative.  An IUD thread was seen.  The cervix was clean.  Uterus  was of normal size and shape.  Both ovaries were normal and nontender.   DIAGNOSIS:  Right ovarian cyst, resolved.   DISPOSITION:  P.r.n. visits.  The patient needs a Pap smear in one year.           ______________________________  Dorthula Perfect, MD     ER/MEDQ  D:  05/12/2006  T:  05/13/2006  Job:  [id number]

## 2011-09-10 LAB — POCT CARDIAC MARKERS
CKMB, poc: <1 — ABNORMAL LOW
Myoglobin, poc: 30.2
Operator id: 272551
Troponin i, poc: <0.05

## 2011-09-10 LAB — DIFFERENTIAL
Eosinophils Absolute: 0.1
Lymphocytes Relative: 32
Lymphs Abs: 2
Monocytes Relative: 9
Neutro Abs: 3.5
Neutrophils Relative %: 56

## 2011-09-10 LAB — I-STAT 8, (EC8 V) (CONVERTED LAB)
Bicarbonate: 28.8 — ABNORMAL HIGH
HCT: 44
Hemoglobin: 15
Operator id: 272551
Sodium: 139
TCO2: 30
pCO2, Ven: 42.5 — ABNORMAL LOW

## 2011-09-10 LAB — CBC
MCV: 84.3
RBC: 4.61
WBC: 6.2

## 2011-09-30 LAB — COMPREHENSIVE METABOLIC PANEL
ALT: 14
AST: 16
Alkaline Phosphatase: 52
Calcium: 9
GFR calc Af Amer: >60
Glucose, Bld: 118 — ABNORMAL HIGH
Potassium: 3.1 — ABNORMAL LOW
Sodium: 134 — ABNORMAL LOW
Total Protein: 7.3

## 2011-09-30 LAB — RPR: RPR Ser Ql: NONREACTIVE

## 2011-09-30 LAB — URINE MICROSCOPIC-ADD ON

## 2011-09-30 LAB — URINALYSIS, ROUTINE W REFLEX MICROSCOPIC
Glucose, UA: NEGATIVE
Leukocytes, UA: NEGATIVE
Protein, ur: NEGATIVE
Specific Gravity, Urine: 1.01
Urobilinogen, UA: 0.2

## 2011-09-30 LAB — DIFFERENTIAL
Basophils Relative: 1
Eosinophils Absolute: 0.1
Eosinophils Relative: 2
Lymphs Abs: 1.9
Monocytes Absolute: 0.5
Monocytes Relative: 8
Neutrophils Relative %: 57

## 2011-09-30 LAB — CBC
Hemoglobin: 14.1
MCHC: 33.6
RBC: 4.87
RDW: 13.8

## 2011-09-30 LAB — GC/CHLAMYDIA PROBE AMP, GENITAL: GC Probe Amp, Genital: NEGATIVE

## 2011-09-30 LAB — PREGNANCY, URINE: Preg Test, Ur: NEGATIVE

## 2012-02-08 ENCOUNTER — Encounter: Payer: Self-pay | Admitting: Internal Medicine

## 2012-02-08 ENCOUNTER — Ambulatory Visit (INDEPENDENT_AMBULATORY_CARE_PROVIDER_SITE_OTHER): Payer: Medicaid Other | Admitting: Internal Medicine

## 2012-02-08 DIAGNOSIS — R259 Unspecified abnormal involuntary movements: Secondary | ICD-10-CM

## 2012-02-08 DIAGNOSIS — I10 Essential (primary) hypertension: Secondary | ICD-10-CM

## 2012-02-08 DIAGNOSIS — G250 Essential tremor: Secondary | ICD-10-CM

## 2012-02-08 DIAGNOSIS — H9209 Otalgia, unspecified ear: Secondary | ICD-10-CM | POA: Insufficient documentation

## 2012-02-08 DIAGNOSIS — K219 Gastro-esophageal reflux disease without esophagitis: Secondary | ICD-10-CM

## 2012-02-08 MED ORDER — ESOMEPRAZOLE MAGNESIUM 20 MG PO CPDR: 20.0000 mg | DELAYED_RELEASE_CAPSULE | Freq: Every day | ORAL | Status: DC

## 2012-02-08 MED ORDER — LISINOPRIL-HYDROCHLOROTHIAZIDE 20-12.5 MG PO TABS: 1.0000 | ORAL_TABLET | Freq: Every day | ORAL | Status: DC

## 2012-02-08 NOTE — Assessment & Plan Note (Signed)
It is not well controlled. He bp is 130/100 and repeated Bp is 150/100. It is most likely due to non-compliance to her medications. Will start Prinzide. Will check her BMP in 3 weeks.

## 2012-02-08 NOTE — Progress Notes (Signed)
Subjective:   Patient ID: Ashley Huynh female   DOB: 04-05-1966 46 y.o.   MRN: [id number]  HPI: Ashley Huynh is a 46 y.o. lady with PMH significant for HTN, who presents for evaluation of her blood pressure and " head Nodding".   Patient reports that she supposes to take 3 medications for her HTN. But she run off her Prinzide 2 months ago and her Lasix one month ago. She has  been only taking 25 mg of Carvedilol bid recently. [facility] and was told that patient's last refill was on May 2012 for all her medications. It is likely that she may have not taken her medications regularly for HTN. Today her bp is 130/100 when she came in and repeated Bp is 150/100 mmHg.   Patient also reports that she has "head Nodding" in past 6 months. It happens randomly and is worse when she is tired from working. It is not alleviated by any known factors. It is not associated with hand shaking, tremors, headache, dizziness,  imbalance, weakness or numbness in her extremities. No nausea or vomiting.   Patient reports having mild pain in her right ear and noticed some brownish fluid discharge from her right ear. It has been going on for about a month. No ringing or hearing lose.   Denies fever, chills, fatigue, cough, chest pain, SOB,  abdominal pain,diarrhea, constipation, dysuria, urgency, frequency, hematuria, joint pain or leg swelling.   No past medical history on file. Current Outpatient Prescriptions  Medication Sig Dispense Refill  . esomeprazole (NEXIUM) 20 MG capsule Take 1 capsule (20 mg total) by mouth daily.  30 capsule  3  . lisinopril-hydrochlorothiazide (PRINZIDE) 20-12.5 MG per tablet Take 1 tablet by mouth daily.  30 tablet  11   No family history on file. History   Social History  . Marital Status: Married    Spouse Name: N/A    Number of Children: N/A  . Years of Education: N/A   Social History Main Topics  . Smoking  status: Never Smoker   . Smokeless tobacco: Not on file  . Alcohol Use: Not on file  . Drug Use: Not on file  . Sexually Active: Not on file   Other Topics Concern  . Not on file   Social History Narrative  . No narrative on file   Review of Systems:  General: no fevers, chills, no changes in body weight, no changes in appetite Skin: no rash HEENT: no blurry vision, hearing changes or sore throat. Has right ear pain and discharge Pulm: no dyspnea, coughing, wheezing CV: no chest pain, palpitations, shortness of breath Abd: no nausea/vomiting, abdominal pain, diarrhea/constipation GU: no dysuria, hematuria, polyuria Ext: no arthralgias, myalgias Neuro: no weakness, numbness, or tingling   Objective:  Physical Exam: There were no vitals filed for this visit.  General: not in acute distress HEENT: PERRL, EOMI, no scleral icterus. There is no ear discharge and signs of infection by Otoscope exam. Her tympanic membrane is normal. Cardiac: S1/S2, RRR, No murmurs, gallops or rubs Pulm: Good air movement bilaterally, Clear to auscultation bilaterally, No rales, wheezing, rhonchi or rubs. Abd: Soft,  nondistended, nontender, no rebound pain, no organomegaly, BS present Ext: No rashes or edema, 2+DP/PT pulse bilaterally Neuro: alert and oriented X3, cranial nerves II-XII grossly intact, muscle strength 5/5 in all extremeties,  sensation to light touch intact. Normal Finger to Nose. No Babinski's sign. Noraml gait. Negative  for Romberg's sign.  Assessment & Plan:   # HTN: not well controlled. He bp is 130/100 and repeated Bp is 150/100. It is most likely due to non-compliance to her medications. Will start Prinzide. Will check her BMP in 3 weeks.  # GERD: patient has heart burn for long time. Will treat with Nexium and follow up.  # Ear pain: her ear examination is normal. No signs of infection. No discharge. No hearing loss. Will observe and follow up.  # " Head Nodding": etiology  is not clear. Patient's neuron examination is completely normal. But it symptoms can be the signs of serious neurological problem, such as 3th ventricle enlargement and Parkinson's diease.  Will give her referral to neurology for further evaluation.  Lorretta Harp

## 2012-02-08 NOTE — Patient Instructions (Signed)
1. Please follow-up in the clinic in 3 weeks. 2. Please take all medications as prescribed.  3. If you have worsening of your symptoms or new symptoms arise, please call the clinic ([phone]), or go to the ER immediately if symptoms are severe. 

## 2012-02-09 ENCOUNTER — Encounter: Payer: Self-pay | Admitting: Internal Medicine

## 2012-02-09 LAB — BASIC METABOLIC PANEL WITH GFR
Calcium: 8.9 mg/dL (ref 8.4–10.5)
GFR, Est African American: >89 mL/min
Glucose, Bld: 87 mg/dL (ref 70–99)
Potassium: 3.3 mEq/L — ABNORMAL LOW (ref 3.5–5.3)
Sodium: 139 mEq/L (ref 135–145)

## 2012-02-09 MED ORDER — POTASSIUM CHLORIDE ER 10 MEQ PO TBCR: 40.0000 meq | EXTENDED_RELEASE_TABLET | Freq: Once | ORAL | Status: DC

## 2012-02-09 MED ORDER — OMEPRAZOLE 10 MG PO CPDR: 10.0000 mg | DELAYED_RELEASE_CAPSULE | Freq: Every day | ORAL | Status: DC

## 2012-02-10 NOTE — Progress Notes (Signed)
This encounter was created in error - please disregard.

## 2012-02-29 ENCOUNTER — Encounter: Payer: Self-pay | Admitting: Internal Medicine

## 2012-02-29 ENCOUNTER — Ambulatory Visit (INDEPENDENT_AMBULATORY_CARE_PROVIDER_SITE_OTHER): Payer: Medicaid Other | Admitting: Internal Medicine

## 2012-02-29 VITALS — BP 148/101 | HR 69 | Temp 97.9°F | Ht 63.0 in | Wt 152.9 lb

## 2012-02-29 DIAGNOSIS — Z23 Encounter for immunization: Secondary | ICD-10-CM

## 2012-02-29 DIAGNOSIS — N390 Urinary tract infection, site not specified: Secondary | ICD-10-CM

## 2012-02-29 DIAGNOSIS — R300 Dysuria: Secondary | ICD-10-CM | POA: Insufficient documentation

## 2012-02-29 DIAGNOSIS — M549 Dorsalgia, unspecified: Secondary | ICD-10-CM

## 2012-02-29 DIAGNOSIS — K219 Gastro-esophageal reflux disease without esophagitis: Secondary | ICD-10-CM

## 2012-02-29 DIAGNOSIS — I10 Essential (primary) hypertension: Secondary | ICD-10-CM

## 2012-02-29 DIAGNOSIS — N644 Mastodynia: Secondary | ICD-10-CM

## 2012-02-29 MED ORDER — LEVOFLOXACIN 250 MG PO TABS: 250.0000 mg | ORAL_TABLET | Freq: Every day | ORAL | Status: DC

## 2012-02-29 MED ORDER — LISINOPRIL-HYDROCHLOROTHIAZIDE 20-12.5 MG PO TABS: 2.0000 | ORAL_TABLET | Freq: Every day | ORAL | Status: DC

## 2012-02-29 NOTE — Patient Instructions (Signed)
1. I increased your Prinzide dose from one pill to two pills because your blood pressure is not well controlled. Please double the dose of this medication from now. 2. Your back pain and pain when your urinate are most likely caused by urinary tract infection. I give you an antibiotic prescription. Please take Levaquin as prescribed. 3. If you have worsening of your symptoms or new symptoms arise, please call the clinic ([phone]), or go to the ER immediately if symptoms are severe.

## 2012-02-29 NOTE — Progress Notes (Signed)
Subjective:   Patient ID: Maeghan Canny female   DOB: 10/29/1966 46 y.o.   MRN: [id number]  HPI:  Ms.Cionna Hedtke is a 46 y.o. lady with PMH significant for HTN and GERD, who presents with dysuria, back pain and left breast pain. Per patient, she started having dysuria and burning sensation when she urinates since 7 days ago. She also reports having right side back pain, which aching, dull and non-radiating. She has mild abdominal pain, subjective fever, no chills.   Patient also reports having pain in her left breast in past 2 weeks. She is using IUD and has not had menstrual period for several years. Therefore she does not know whether her breast pain is related to menstrual periods. She did not notice any mass, redness or swelling in her left breasts.  In her last visit, we adjusted her blood pressure medications. She has been taking Prinzide (20-12.5 mg daily) since 02/08/12. Today her blood pressure is still elevated.   Denies cough, chest pain, SOB, diarrhea, constipation, joint pain or leg swelling.  No past medical history on file. Current Outpatient Prescriptions  Medication Sig Dispense Refill  . lisinopril-hydrochlorothiazide (PRINZIDE) 20-12.5 MG per tablet Take 2 tablets by mouth daily.  60 tablet  11  . omeprazole (PRILOSEC) 10 MG capsule Take 1 capsule (10 mg total) by mouth daily.  30 capsule  3  . levofloxacin (LEVAQUIN) 250 MG tablet Take 1 tablet (250 mg total) by mouth daily.  7 tablet  0  . potassium chloride (K-DUR) 10 MEQ tablet Take 4 tablets (40 mEq total) by mouth once.  4 tablet  0   No family history on file. History   Social History  . Marital Status: Married    Spouse Name: N/A    Number of Children: N/A  . Years of Education: N/A   Social History Main Topics  . Smoking status: Never Smoker   . Smokeless tobacco: Not on file  . Alcohol Use: Not on file  . Drug Use: Not on file  . Sexually Active: Not on file   Other Topics Concern  . Not on file    Social History Narrative  . No narrative on file   Review of Systems:  General: has subjective fevers, No chills, no changes in body weight. Skin: no rash HEENT: no blurry vision, hearing changes or sore throat Pulm: no dyspnea, coughing, wheezing CV: no chest pain, palpitations, shortness of breath Abd: no nausea/vomiting. Mild abdominal pain. No diarrhea/constipation.  GU: no dysuria, hematuria, polyuria Ext: no arthralgias, myalgias Neuro: no weakness, numbness, or tingling  Objective:  Physical Exam: Filed Vitals:   02/29/12 1514  BP: 148/101  Pulse: 69  Temp: 97.9 F (36.6 C)  TempSrc: Oral  Height: 5\' 3"  (1.6 m)  Weight: 152 lb 14.4 oz (69.355 kg)  SpO2: 98%   General: not in acute distress HEENT: PERRL, EOMI, no scleral icterus Cardiac: S1/S2, RRR, No murmurs, gallops or rubs Pulm: Good air movement bilaterally, Clear to auscultation bilaterally, No rales, wheezing, rhonchi or rubs. Abd: Soft,  Non-distended. Mild tenderness over LUQ and RUQ, no rebound pain, no organomegaly, BS present. Tender over right CVA area upon tapping.  Breasts: right breast normal. There is tenderness over lateral Lower quadrant in left breast. There is no mass, swelling or redness in left breast. Ext: No rashes or edema, 2+DP/PT pulse bilaterally Neuro: alert and oriented X3, cranial nerves II-XII grossly intact, muscle strength 5/5 in all extremeties,  sensation to light touch  intact.    Assessment & Plan:   # UTI: Patient's dysuria and burning on urination is most likely caused by UTI. Since she has tenderness over her right CVA, there is concern that she may have pyelonephritis. Her symptoms are mild. She does not have fever now. No need for IV antibiotics. Will get UA and urine culture. Will treat her with 7 days of oral Levaquin and follow up.  #. HTN: Her blood pressure medications were adjusted in last visit. She has been taking Prinzide (20-12.5 mg daily) since 02/08/12. Today  her blood pressure is still elevated (148/101 when she came in and 160/100 mmHg on repeat). Will double the dose of Prinzide from now. Will check her CMP for potassium and Cre today and repeat the test in two weeks.  # left breast pain: No signs of infection. No mass was detected. At her age group, this is likely caused by fibrocystic disease. Will get mammograph and follow up.  # GERD: well controlled. Will continue Omeprazole and follow up.    Lorretta Harp

## 2012-03-01 ENCOUNTER — Other Ambulatory Visit: Payer: Self-pay | Admitting: Internal Medicine

## 2012-03-01 DIAGNOSIS — I10 Essential (primary) hypertension: Secondary | ICD-10-CM

## 2012-03-01 LAB — BASIC METABOLIC PANEL WITH GFR
BUN: 15 mg/dL (ref 6–23)
CO2: 30 mEq/L (ref 19–32)
Calcium: 10 mg/dL (ref 8.4–10.5)
Chloride: 97 mEq/L (ref 96–112)
Creat: 0.59 mg/dL (ref 0.50–1.10)
GFR, Est African American: >89 mL/min
GFR, Est Non African American: >89 mL/min
Glucose, Bld: 98 mg/dL (ref 70–99)

## 2012-03-01 LAB — URINALYSIS, ROUTINE W REFLEX MICROSCOPIC
Bilirubin Urine: NEGATIVE
Glucose, UA: NEGATIVE mg/dL
Leukocytes, UA: NEGATIVE
Nitrite: NEGATIVE
Protein, ur: 30 mg/dL — AB
Specific Gravity, Urine: 1.02 (ref 1.005–1.030)
Urobilinogen, UA: 0.2 mg/dL (ref 0.0–1.0)

## 2012-03-01 LAB — URINALYSIS, MICROSCOPIC ONLY
Bacteria, UA: NONE SEEN
Casts: NONE SEEN
Crystals: NONE SEEN

## 2012-03-01 MED ORDER — POTASSIUM CHLORIDE ER 10 MEQ PO TBCR: 20.0000 meq | EXTENDED_RELEASE_TABLET | Freq: Once | ORAL | Status: DC

## 2012-03-02 LAB — URINE CULTURE: Colony Count: 9000

## 2012-03-07 ENCOUNTER — Encounter (HOSPITAL_COMMUNITY): Payer: Self-pay | Admitting: *Deleted

## 2012-03-07 ENCOUNTER — Emergency Department (HOSPITAL_COMMUNITY)
Admission: EM | Admit: 2012-03-07 | Discharge: 2012-03-07 | Disposition: A | Discharge status: Home / Self Care | Payer: Medicaid Other | Attending: Emergency Medicine | Admitting: Emergency Medicine

## 2012-03-07 ENCOUNTER — Emergency Department (HOSPITAL_COMMUNITY): Payer: Medicaid Other

## 2012-03-07 DIAGNOSIS — H53149 Visual discomfort, unspecified: Secondary | ICD-10-CM | POA: Insufficient documentation

## 2012-03-07 DIAGNOSIS — R259 Unspecified abnormal involuntary movements: Secondary | ICD-10-CM | POA: Insufficient documentation

## 2012-03-07 DIAGNOSIS — R42 Dizziness and giddiness: Secondary | ICD-10-CM | POA: Insufficient documentation

## 2012-03-07 DIAGNOSIS — H5789 Other specified disorders of eye and adnexa: Secondary | ICD-10-CM | POA: Insufficient documentation

## 2012-03-07 DIAGNOSIS — I10 Essential (primary) hypertension: Secondary | ICD-10-CM

## 2012-03-07 DIAGNOSIS — Z79899 Other long term (current) drug therapy: Secondary | ICD-10-CM | POA: Insufficient documentation

## 2012-03-07 DIAGNOSIS — R51 Headache: Secondary | ICD-10-CM | POA: Insufficient documentation

## 2012-03-07 DIAGNOSIS — R209 Unspecified disturbances of skin sensation: Secondary | ICD-10-CM | POA: Insufficient documentation

## 2012-03-07 DIAGNOSIS — H11419 Vascular abnormalities of conjunctiva, unspecified eye: Secondary | ICD-10-CM | POA: Insufficient documentation

## 2012-03-07 HISTORY — DX: Essential (primary) hypertension: I10

## 2012-03-07 MED ORDER — DIPHENHYDRAMINE HCL 50 MG/ML IJ SOLN
25.0000 mg | Freq: Once | INTRAMUSCULAR | Status: AC
  Administered 2012-03-07: 07:00:00 via INTRAVENOUS
  Filled 2012-03-07: qty 1

## 2012-03-07 MED ORDER — METOCLOPRAMIDE HCL 5 MG/ML IJ SOLN
10.0000 mg | Freq: Once | INTRAMUSCULAR | Status: AC
  Administered 2012-03-07: 10 mg via INTRAVENOUS
  Filled 2012-03-07: qty 2

## 2012-03-07 NOTE — ED Notes (Signed)
C/o HA and R hand shaking, h/o same, pt of [facility].

## 2012-03-07 NOTE — Discharge Instructions (Signed)
Please read and follow all provided instructions.  Your diagnoses today include:  1. Headache   2. HTN (hypertension)     Tests performed today include:  CT of your head which was normal and did not show any serious cause of your headache  Vital signs. See below for your results today.   Medications:  In the Emergency Department you received:  Reglan - antinausea/headache medication  Benadryl - antihistamine to counteract potential side effects of reglan  Take any prescribed medications only as directed.  Additional instructions:  Follow any educational materials contained in this packet.  You are having a headache. No specific cause was found today for your headache. It may have been a migraine or other cause of headache. Stress, anxiety, fatigue, and depression are common triggers for headaches.   Your headache today does not appear to be life-threatening or require hospitalization, but often the exact cause of headaches is not determined in the emergency department. Therefore, follow-up with your doctor is very important to find out what may have caused your headache and whether or not you need any further diagnostic testing or treatment.   Sometimes headaches can appear benign (not harmful), but then more serious symptoms can develop which should prompt an immediate re-evaluation by your doctor or the emergency department.  BE VERY CAREFUL not to take multiple medicines containing Tylenol (also called acetaminophen). Doing so can lead to an overdose which can damage your liver and cause liver failure and possibly death.   Follow-up instructions: Please follow-up with your primary care provider in the next 3 days for further evaluation of your symptoms. If you do not have a primary care doctor -- see below for referral information.   Return instructions:   Please return to the Emergency Department if you experience worsening symptoms.  Return if the medications do not  resolve your headache, if it recurs, or if you have multiple episodes of vomiting or cannot keep down fluids.  Return if you have a change from the usual headache.  RETURN IMMEDIATELY IF you:  Develop a sudden, severe headache  Develop confusion or become poorly responsive or faint  Develop a fever above 100.78F or problem breathing  Have a change in speech, vision, swallowing, or understanding  Develop new weakness, numbness, tingling, incoordination in your arms or legs  Have a seizure  Please return if you have any other emergent concerns.  Additional Information:  Your vital signs today were: BP 132/79  Pulse 76  Temp(Src) 98.8 F (37.1 C) (Oral)  Resp 15  SpO2 100% If your blood pressure (BP) was elevated above 135/85 this visit, please have this repeated by your doctor within one month. -------------- No Primary Care Doctor Call [facility]  [phone] Other agencies that provide inexpensive medical care    [facility]  [phone]    [facility]  [phone]    [facility]  [phone]    [facility]  [phone]    [facility]  [phone]    [facility]  [phone] -------------- RESOURCE GUIDE:  Dental Problems  Patients with Medicaid: [facility] [phone] [address]  [address] Phone:  [phone]                                                   Phone:  [phone]  If unable to pay or uninsured, contact:  [facility] or [facility]. to become qualified for the adult dental clinic.  Chronic Pain Problems Contact [facility]  [phone] Patients need to be referred by their primary care doctor.  Insufficient Money for Medicine Contact [employer]:  call "211" or [facility] [phone].  Psychological Services Loc [facility]  [phone] [facility]   [phone] [facility]   [phone] (emergency services [phone])  Substance Abuse Resources [facility]  [phone] [facility] [phone] [facility] [phone] Floydene Flock [phone] Residential & Outpatient Substance Abuse Program  [phone]  Abuse/Neglect [facility] Child Abuse Hotline ([phone] [facility] Child Abuse Hotline [phone] (After Hours)  Emergency Shelter [facility] [phone]  Maternity Homes [facility] at the [place] [facility] [place] [phone] [facility] [phone]  [facility]  [facility]     [employer]                          [facility] Dept. [address] [place]                       [address]      [address]  Blondell Reveal Phone:  [phone]                                   Phone:  [phone]                 Phone:  [phone]  [facility] Phone:  [phone]  [facility] Child Abuse Hotline (914)[phone] (After Hours)

## 2012-03-07 NOTE — ED Notes (Signed)
IV d/c'd, pt being discharged home

## 2012-03-07 NOTE — ED Notes (Signed)
Pt resting; no needs at this time; family at bedside 

## 2012-03-07 NOTE — ED Provider Notes (Signed)
History     CSN: [id number]  Arrival date & time 03/07/12  0542   First MD Initiated Contact with Patient 03/07/12 0557      Chief Complaint  Patient presents with  . Headache    (Consider location/radiation/quality/duration/timing/severity/associated sxs/prior treatment) HPI Comments: Patient with history of hypertension -- presents with acute onset of right sided unilateral lateral headache at 0100. Patient states she sometimes gets headaches with her high blood pressure however they're not usually this severe. Patient was awakened from sleep with headache and right arm and shoulder shaking and tingling. She denies weakness in her arms or legs. She denies confusion, vision change ,trouble walking, facial droop. Patient has complained of head shaking/bobbing to her primary care physician and previous medical records show plan for neurology followup for this, however the patient states she was never sent an appointment and has never followed up. She denies history of migraine headaches. She denies head injury. Patient states photophobia with headache. Patient also reports past episodes of dizziness described as lightheadedness and spinning. She denies disequilibrium.  Patient is a 46 y.o. female presenting with headaches. The history is provided by the patient and the spouse.  Headache  This is a new problem. The current episode started 3 to 5 hours ago. The problem occurs constantly. The problem has been gradually worsening. The headache is associated with bright light. The pain is located in the right unilateral region. The quality of the pain is described as sharp. The pain does not radiate. Pertinent negatives include no fever, no near-syncope, no palpitations, no shortness of breath, no nausea and no vomiting. She has tried nothing for the symptoms.    Past Medical History  Diagnosis Date  . Hypertension     Past Surgical History  Procedure Date  . External ear surgery     right     Family History  Problem Relation Age of Onset  . Hypertension Mother   . Hypertension Father     History  Substance Use Topics  . Smoking status: Never Smoker   . Smokeless tobacco: Not on file  . Alcohol Use: No    OB History    Grav Para Term Preterm Abortions TAB SAB Ect Mult Living                  Review of Systems  Constitutional: Negative for fever and fatigue.  HENT: Negative for neck pain and tinnitus.   Eyes: Positive for redness. Negative for photophobia, pain and visual disturbance.  Respiratory: Negative for shortness of breath.   Cardiovascular: Negative for chest pain, palpitations and near-syncope.  Gastrointestinal: Negative for nausea and vomiting.  Musculoskeletal: Negative for back pain and gait problem.  Skin: Negative for wound.  Neurological: Positive for light-headedness and headaches. Negative for dizziness, weakness and numbness.  Psychiatric/Behavioral: Negative for confusion and decreased concentration.    Allergies  Review of patient's allergies indicates no known allergies.  Home Medications   Current Outpatient Rx  Name Route Sig Dispense Refill  . LISINOPRIL-HYDROCHLOROTHIAZIDE 20-12.5 MG PO TABS Oral Take 2 tablets by mouth daily. 60 tablet 11  . OMEPRAZOLE 10 MG PO CPDR Oral Take 1 capsule (10 mg total) by mouth daily. 30 capsule 3  . POTASSIUM CHLORIDE ER 10 MEQ PO TBCR Oral Take 2 tablets (20 mEq total) by mouth once. 2 tablet 0    BP 171/123  Pulse 69  Temp(Src) 98 F (36.7 C) (Oral)  Resp 20  SpO2 100%  Physical Exam  Nursing note and vitals reviewed. Constitutional: She is oriented to person, place, and time. She appears well-developed and well-nourished.  HENT:  Head: Normocephalic and atraumatic. Head is without raccoon's eyes and without Battle's sign.  Right Ear: Tympanic membrane, external ear and ear canal normal. No hemotympanum.  Left Ear: Tympanic membrane, external ear and ear canal normal. No  hemotympanum.  Nose: Nose normal. No nasal septal hematoma.  Mouth/Throat: Uvula is midline, oropharynx is clear and moist and mucous membranes are normal.       No sinus tenderness to palpation. No evidence of oral abscess or infection.  Eyes: EOM and lids are normal. Pupils are equal, round, and reactive to light. Right conjunctiva is injected. Right conjunctiva has no hemorrhage. Left conjunctiva is injected. Left conjunctiva has no hemorrhage. Right eye exhibits normal extraocular motion and no nystagmus. Left eye exhibits normal extraocular motion and no nystagmus.       No visible hyphema noted  Neck: Normal range of motion. Neck supple.  Cardiovascular: Normal rate and regular rhythm.   Pulmonary/Chest: Effort normal and breath sounds normal.  Abdominal: Soft. There is no tenderness.  Musculoskeletal:       Cervical back: She exhibits normal range of motion, no tenderness and no bony tenderness.       Thoracic back: She exhibits no tenderness and no bony tenderness.       Lumbar back: She exhibits no tenderness and no bony tenderness.       No shaking of right arm or shoulder. Patient reports mild paresthesia in this shoulder. Full-strength and range of motion in upper extremities.  Neurological: She is alert and oriented to person, place, and time. She has normal strength and normal reflexes. No cranial nerve deficit or sensory deficit. She exhibits normal muscle tone. Coordination normal. GCS eye subscore is 4. GCS verbal subscore is 5. GCS motor subscore is 6.  Skin: Skin is warm and dry.  Psychiatric: She has a normal mood and affect.    ED Course  Procedures (including critical care time)  Labs Reviewed - No data to display Ct Head Wo Contrast  03/07/2012  *RADIOLOGY REPORT*  Clinical Data: Headache.  Shaking of the right hand.  Hypertension.  CT HEAD WITHOUT CONTRAST  Technique:  Contiguous axial images were obtained from the base of the skull through the vertex without  contrast.  Comparison: 10/25/2006  Findings: The brain stem, cerebellum, cerebral peduncles, thalami, basal ganglia, basilar cisterns, and ventricular system appear unremarkable.  No intracranial hemorrhage, mass lesion, or acute infarction is identified.  IMPRESSION:  1.  No significant abnormality identified.  Original Report Authenticated By: Dellia Cloud, M.D.     1. Headache   2. HTN (hypertension)     6:24 AM Patient seen and examined. Work-up initiated. Medications ordered.   Vital signs reviewed and are as follows: Filed Vitals:   03/07/12 0614  BP: 171/123  Pulse: 69  Temp:   Resp: 20   Will CT given history of neurological complaints, severe HA, awoke from sleep. PCP had planned on neuro referral in 02/13 but this was never scheduled.   7:20 AM Headache improved with reglan. BP is 140/90. Patient is resting comfortably.   Patient was discussed with Vida Roller, MD  8:43 AM CT reviewed by myself. Pt and husband informed of results. Urged PCP follow-up given neuro symptoms and HTN. They are agreeable with discharge to home. Symptoms still resolved. Urged return with worsening severe HA, weakness in arms/legs,  confusion, facial droop, trouble talking, other concerns. They verbalize understanding and agree with plan.   MDM  HA, HTN -- improved with treatment. CT performed and is negative. Do not suspect SAH or other bleeding. No concern for meningitis. Normal neurological exam in ED.         Renne Crigler, [place] 03/07/12 [phone]

## 2012-03-07 NOTE — ED Notes (Signed)
Pt sleeping. No complaints. Family at the bedside.

## 2012-03-08 NOTE — ED Provider Notes (Signed)
Medical screening examination/treatment/procedure(s) were performed by non-physician practitioner and as supervising physician I was immediately available for consultation/collaboration.   Vida Roller, MD 03/08/12 [phone]

## 2012-03-14 ENCOUNTER — Telehealth: Payer: Self-pay | Admitting: *Deleted

## 2012-03-14 ENCOUNTER — Ambulatory Visit (INDEPENDENT_AMBULATORY_CARE_PROVIDER_SITE_OTHER): Payer: Medicaid Other | Admitting: Internal Medicine

## 2012-03-14 VITALS — BP 198/123 | HR 90 | Temp 98.0°F | Wt 154.5 lb

## 2012-03-14 DIAGNOSIS — K219 Gastro-esophageal reflux disease without esophagitis: Secondary | ICD-10-CM

## 2012-03-14 DIAGNOSIS — E876 Hypokalemia: Secondary | ICD-10-CM

## 2012-03-14 DIAGNOSIS — I10 Essential (primary) hypertension: Secondary | ICD-10-CM

## 2012-03-14 LAB — BASIC METABOLIC PANEL WITH GFR
BUN: 9 mg/dL (ref 6–23)
CO2: 32 mEq/L (ref 19–32)
Calcium: 9.5 mg/dL (ref 8.4–10.5)
Creat: 0.54 mg/dL (ref 0.50–1.10)
GFR, Est Non African American: >89 mL/min
Sodium: 138 mEq/L (ref 135–145)

## 2012-03-14 MED ORDER — POTASSIUM CHLORIDE ER 10 MEQ PO TBCR: EXTENDED_RELEASE_TABLET | ORAL | Status: DC

## 2012-03-14 MED ORDER — OMEPRAZOLE 40 MG PO CPDR: 40.0000 mg | DELAYED_RELEASE_CAPSULE | Freq: Every day | ORAL | Status: DC

## 2012-03-14 MED ORDER — LISINOPRIL 40 MG PO TABS: 40.0000 mg | ORAL_TABLET | Freq: Every day | ORAL | Status: DC

## 2012-03-14 NOTE — Progress Notes (Signed)
History of present illness: Mrs. Binford is a 46 yo woman with past medical history of hypertension, GERD, headache presents today for blood pressure followup. Patient was recently seen by her PCP about 2 weeks ago where her blood pressure medication was increased (lisinopril/hctz 20/12.5mg  2 tablets daily).  She had a CMP which showed a potassium of 3.4. Her PCP subsequently called in prescription for KCl 20 mEqx1 dose.  She was told to come back today for repeat BMP. She has been tolerating the BP meds well without any side effects except for feeling some dizziness.  Orthostatic vitals today: GERD: symptoms are not well-controlled, still has significant heart burn.  Currently taking Omeprazole 10mg  qd.  She was seen in the ED for headache and right hand tremor and CT was negative.  She was given an appointment for Neurology follow up.  She wants to know more about orange card and would like to apply because her medicaid runs out next month.  ROS: as per HPI  PE: General: alert, well-developed, and cooperative to examination.   Lungs: normal respiratory effort, no accessory muscle use, normal breath sounds, no crackles, and no wheezes. Heart: normal rate, regular rhythm, no murmur, no gallop, and no rub.  Abdomen: soft, non-tender, normal bowel sounds, no distention, no guarding, no rebound tenderness Pulses: 2+ DP/PT pulses bilaterally Extremities: No cyanosis, clubbing, edema Neurologic: alert & oriented X3, cranial nerves II-XII intact, strength normal in all extremities, sensation intact to light touch, and gait normal.

## 2012-03-14 NOTE — Assessment & Plan Note (Addendum)
Well-controlled. Patient has been taking 2 tablets of lisinopril/HCTZ 20/12.5 mg daily. She did have mild hypokalemia on her CMP in 02/29/2012 which could be due to diuretics. She took of KCl last week. She also complains of dizziness/lightheadedness which could be volume depleted. -Orthostatic vitals: -repeat BMP today and I will call her back with results and discuss treatment : K 2.9, called in KCl x 3 doses, repeat BMP on 03/16/12, stop HCTZ.  We called patient to inform of the changes and the lab results -Continue Lisinopril 40mg  qd  - Another option would be ACEi and CCB such as Norvasc if her BP still persistently elevated -Follow up with PCP in 1-2 week  -Case discussed with Dr. Aundria Rud

## 2012-03-14 NOTE — Telephone Encounter (Signed)
Called pt's home, female person said she is at grocery, will be home in 1 hr Called cell ph [phone], left message to call dr ho's office Called pharmacy, stopped lisin/hctz, start plain lisinopril and K+, message attached to script to come to clinic thurs for lab work Will call pt in am, to clarify instructions and assure that she got them

## 2012-03-14 NOTE — Patient Instructions (Addendum)
Increase Omeprazole to 40 mg one tablet daily Will get blood work today and I will call you with any abnormal lab results Schedule appointment with Jaynee Eagles to apply for orange card Follow up with your primary care physician in 1-2 weeks, sooner if needed

## 2012-03-14 NOTE — Assessment & Plan Note (Signed)
Not well controlled on 10mg  of Omeprazole. -Will increase to Omeprazole 40mg  po qd

## 2012-03-15 NOTE — Telephone Encounter (Signed)
Attempted to contact pt at home #, no answer.  Called cell # [phone], "female" said she was at work and wouldn't get off until 3pm.

## 2012-03-15 NOTE — Telephone Encounter (Signed)
Spoke with pt's daughter with pt by her side.  Informed pt of abnormal lab results.  Instructed her to d/c hctz and pick up lisinopril and potassium from the pharmacy.  Pt will stop by office first thing in the morning to have stat labs drawn.Kingsley Spittle Cassady3/27/20135:01 PM

## 2012-03-15 NOTE — Telephone Encounter (Signed)
Used [employer] [phone] (access [phone]) to call both numbers on chart, no answer.  Had them to leave message in Arabic that pt had abnormal labs and needed to go by pharmacy ASAP to pick up new rx for potassium. Pt should then return to clinic tomorrow for stat labs.  Will attempt to contact pt again this afternoon.Kingsley Spittle Cassady3/27/201312:44 PM

## 2012-03-16 ENCOUNTER — Encounter (HOSPITAL_COMMUNITY): Payer: Self-pay | Admitting: Physical Medicine and Rehabilitation

## 2012-03-16 ENCOUNTER — Emergency Department (HOSPITAL_COMMUNITY)
Admission: EM | Admit: 2012-03-16 | Discharge: 2012-03-16 | Disposition: A | Discharge status: Home / Self Care | Payer: Medicaid Other | Attending: Emergency Medicine | Admitting: Emergency Medicine

## 2012-03-16 ENCOUNTER — Other Ambulatory Visit (INDEPENDENT_AMBULATORY_CARE_PROVIDER_SITE_OTHER): Payer: Medicaid Other

## 2012-03-16 DIAGNOSIS — I10 Essential (primary) hypertension: Secondary | ICD-10-CM | POA: Insufficient documentation

## 2012-03-16 DIAGNOSIS — E876 Hypokalemia: Secondary | ICD-10-CM

## 2012-03-16 DIAGNOSIS — R209 Unspecified disturbances of skin sensation: Secondary | ICD-10-CM | POA: Insufficient documentation

## 2012-03-16 DIAGNOSIS — R51 Headache: Secondary | ICD-10-CM | POA: Insufficient documentation

## 2012-03-16 LAB — BASIC METABOLIC PANEL
BUN: 9 mg/dL (ref 6–23)
Calcium: 9 mg/dL (ref 8.4–10.5)
GFR calc Af Amer: >90 mL/min (ref >90)
GFR calc non Af Amer: >90 mL/min (ref >90)
Potassium: 4.9 mEq/L (ref 3.5–5.1)
Sodium: 139 mEq/L (ref 135–145)

## 2012-03-16 LAB — BASIC METABOLIC PANEL WITH GFR
Calcium: 9 mg/dL (ref 8.4–10.5)
GFR, Est African American: >89 mL/min
Glucose, Bld: 108 mg/dL — ABNORMAL HIGH (ref 70–99)
Sodium: 140 mEq/L (ref 135–145)

## 2012-03-16 LAB — CBC
MCH: 29.3 pg (ref 26.0–34.0)
Platelets: 244 10*3/uL (ref 150–400)
RBC: 4.78 MIL/uL (ref 3.87–5.11)
RDW: 14.2 % (ref 11.5–15.5)

## 2012-03-16 LAB — DIFFERENTIAL
Basophils Absolute: 0 10*3/uL (ref 0.0–0.1)
Basophils Relative: 0 % (ref 0–1)
Eosinophils Absolute: 0.3 10*3/uL (ref 0.0–0.7)
Lymphs Abs: 2.2 10*3/uL (ref 0.7–4.0)
Neutrophils Relative %: 38 % — ABNORMAL LOW (ref 43–77)

## 2012-03-16 MED ORDER — LABETALOL HCL 100 MG PO TABS
100.0000 mg | ORAL_TABLET | Freq: Once | ORAL | Status: AC
  Administered 2012-03-16: 100 mg via ORAL
  Filled 2012-03-16: qty 1

## 2012-03-16 NOTE — Discharge Instructions (Signed)
Hypertension       Hypertension is another name for high blood pressure. High blood pressure may mean that your heart needs to work harder to pump blood. Blood pressure consists of two numbers, which includes a higher number over a lower number (example: 110/72).     HOME CARE     Make lifestyle changes as told by your doctor. This may include weight loss and exercise.   Take your blood pressure medicine every day.   Limit how much salt you use.   Stop smoking if you smoke.   Do not use drugs.   Talk to your doctor if you are using decongestants or birth control pills. These medicines might make blood pressure higher.   Females should not drink more than 1 alcoholic drink per day. Males should not drink more than 2 alcoholic drinks per day.   See your doctor as told.    GET HELP RIGHT AWAY IF:     You have a blood pressure reading with a top number of 180 or higher.   You get a very bad headache.   You get blurred or changing vision.   You feel confused.   You feel weak, numb, or faint.   You get chest or belly (abdominal) pain.   You throw up (vomit).   You cannot breathe very well.    MAKE SURE YOU:     Understand these instructions.   Will watch your condition.   Will get help right away if you are not doing well or get worse.      Document Released: 05/24/2008 Document Revised: 11/25/2011 Document Reviewed: 05/24/2008   [employer] Patient Information 2012 [employer].

## 2012-03-16 NOTE — ED Notes (Signed)
Per lab, pt K was reported at 4.5 but correct K is 4.9

## 2012-03-16 NOTE — ED Notes (Signed)
Pt presents to department for evaluation of headache, R sided tingling sensation radiating to both feet, photosensitivity and nausea. Onset 03:00 this morning. 8/10 pain at the time. Able to move all extremities without difficulty. Speech clear. She is alert and oriented x4.

## 2012-03-16 NOTE — ED Notes (Signed)
Patient brought to ED by RN at [facility] for c/o headache with tingling on the right side.  Patient woke up around 3am and noticed that she had the tingling and headache.  Patient went to [facility] for check up on her potassium.  Her potassium was 2.9 yesterday and she took 12 potassium pills through out yesterday.  [facility] has drawn CBC and BMET.

## 2012-03-16 NOTE — ED Provider Notes (Signed)
History     CSN: [id number]  Arrival date & time 03/16/12  8413   First MD Initiated Contact with Patient 03/16/12 0913      Chief Complaint  Patient presents with  . Headache    (Consider location/radiation/quality/duration/timing/severity/associated sxs/prior treatment) HPI Comments: Brought from [facility] for eval of HTN, headache, tingling of right arm and leg, "can't stop shaking".  She has had several visits recently for headache, CT last week okay.    Patient is a 46 y.o. female presenting with headaches. The history is provided by the patient.  Headache  This is a recurrent problem. Episode onset: 3AM, this woke her up. The problem occurs constantly. The problem has not changed since onset.The pain is located in the occipital region. The quality of the pain is described as throbbing. The pain is moderate. The pain radiates to the upper back and right neck. Pertinent negatives include no fever, no palpitations, no nausea and no vomiting.    Past Medical History  Diagnosis Date  . Hypertension     Past Surgical History  Procedure Date  . External ear surgery     right    Family History  Problem Relation Age of Onset  . Hypertension Mother   . Hypertension Father     History  Substance Use Topics  . Smoking status: Never Smoker   . Smokeless tobacco: Not on file  . Alcohol Use: No    OB History    Grav Para Term Preterm Abortions TAB SAB Ect Mult Living                  Review of Systems  Constitutional: Negative for fever.  Cardiovascular: Negative for palpitations.  Gastrointestinal: Negative for nausea and vomiting.  Neurological: Positive for headaches.  All other systems reviewed and are negative.    Allergies  Review of patient's allergies indicates no known allergies.  Home Medications   Current Outpatient Rx  Name Route Sig Dispense Refill  . LISINOPRIL 40 MG PO TABS Oral Take 1 tablet (40 mg total) by mouth daily. 31 tablet 3  . OMEPRAZOLE  40 MG PO CPDR Oral Take 1 capsule (40 mg total) by mouth daily. 90 capsule 3  . POTASSIUM CHLORIDE ER 10 MEQ PO TBCR  Take 4 tablets every 4 hours x 3 doses 12 tablet 0    BP 185/116  Pulse 82  Temp(Src) 97.8 F (36.6 C) (Oral)  Resp 20  SpO2 98%  Physical Exam  Nursing note and vitals reviewed. Constitutional: She is oriented to person, place, and time. She appears well-developed and well-nourished. No distress.  HENT:  Head: Normocephalic and atraumatic.  Eyes: EOM are normal. Pupils are equal, round, and reactive to light.  Neck: Normal range of motion. Neck supple.  Cardiovascular: Normal rate and regular rhythm.  Exam reveals no gallop and no friction rub.   No murmur heard. Pulmonary/Chest: Effort normal and breath sounds normal. No respiratory distress. She has no wheezes.  Abdominal: Soft. Bowel sounds are normal. She exhibits no distension. There is no tenderness.  Musculoskeletal: Normal range of motion.  Neurological: She is alert and oriented to person, place, and time. No cranial nerve deficit. She exhibits normal muscle tone. Coordination normal.  Skin: Skin is warm and dry. She is not diaphoretic.    ED Course  Procedures (including critical care time)   Labs Reviewed  CBC  DIFFERENTIAL  BASIC METABOLIC PANEL  TROPONIN I   No results found.  No diagnosis found.    MDM  The labs and blood pressure are improving and the headache is much improved.  We will discharge to home, to return prn.        Geoffery Lyons, MD 03/16/12 [phone]

## 2012-04-19 NOTE — Progress Notes (Signed)
Addended by: Remus Blake on: 04/19/2012 04:28 PM   Modules accepted: Orders

## 2012-05-12 NOTE — Telephone Encounter (Signed)
Addended by: Bufford Spikes on: 05/12/2012 10:22 AM   Modules accepted: Orders

## 2012-07-13 ENCOUNTER — Encounter: Payer: Medicaid Other | Admitting: Internal Medicine

## 2012-11-14 ENCOUNTER — Ambulatory Visit (HOSPITAL_COMMUNITY)
Admission: RE | Admit: 2012-11-14 | Discharge: 2012-11-14 | Disposition: A | Discharge status: Home / Self Care | Payer: Self-pay | Source: Ambulatory Visit | Attending: Internal Medicine | Admitting: Internal Medicine

## 2012-11-14 ENCOUNTER — Ambulatory Visit (INDEPENDENT_AMBULATORY_CARE_PROVIDER_SITE_OTHER): Payer: Self-pay | Admitting: Internal Medicine

## 2012-11-14 VITALS — BP 162/107 | HR 70 | Temp 97.9°F | Ht 63.0 in | Wt 149.3 lb

## 2012-11-14 DIAGNOSIS — R569 Unspecified convulsions: Secondary | ICD-10-CM

## 2012-11-14 DIAGNOSIS — K219 Gastro-esophageal reflux disease without esophagitis: Secondary | ICD-10-CM

## 2012-11-14 DIAGNOSIS — I517 Cardiomegaly: Secondary | ICD-10-CM | POA: Insufficient documentation

## 2012-11-14 DIAGNOSIS — J40 Bronchitis, not specified as acute or chronic: Secondary | ICD-10-CM | POA: Insufficient documentation

## 2012-11-14 DIAGNOSIS — L709 Acne, unspecified: Secondary | ICD-10-CM

## 2012-11-14 DIAGNOSIS — R042 Hemoptysis: Secondary | ICD-10-CM | POA: Insufficient documentation

## 2012-11-14 DIAGNOSIS — I10 Essential (primary) hypertension: Secondary | ICD-10-CM

## 2012-11-14 DIAGNOSIS — L989 Disorder of the skin and subcutaneous tissue, unspecified: Secondary | ICD-10-CM | POA: Insufficient documentation

## 2012-11-14 DIAGNOSIS — L708 Other acne: Secondary | ICD-10-CM

## 2012-11-14 LAB — BASIC METABOLIC PANEL WITH GFR
Calcium: 9.1 mg/dL (ref 8.4–10.5)
GFR, Est African American: >89 mL/min
GFR, Est Non African American: >89 mL/min
Potassium: 3.5 mEq/L (ref 3.5–5.3)
Sodium: 137 mEq/L (ref 135–145)

## 2012-11-14 LAB — CBC
Hemoglobin: 14 g/dL (ref 12.0–15.0)
Platelets: 358 10*3/uL (ref 150–400)
RBC: 4.89 MIL/uL (ref 3.87–5.11)
WBC: 5 10*3/uL (ref 4.0–10.5)

## 2012-11-14 MED ORDER — AZITHROMYCIN 250 MG PO TABS: ORAL_TABLET | ORAL | Status: AC

## 2012-11-14 MED ORDER — OMEPRAZOLE 40 MG PO CPDR: 40.0000 mg | DELAYED_RELEASE_CAPSULE | Freq: Every day | ORAL | Status: DC

## 2012-11-14 MED ORDER — CLINDAMYCIN PHOSPHATE 1 % EX GEL: Freq: Two times a day (BID) | CUTANEOUS | Status: DC

## 2012-11-14 MED ORDER — MINOCYCLINE HCL 100 MG PO CAPS: 100.0000 mg | ORAL_CAPSULE | Freq: Two times a day (BID) | ORAL | Status: DC

## 2012-11-14 MED ORDER — AMLODIPINE BESYLATE 10 MG PO TABS: 10.0000 mg | ORAL_TABLET | Freq: Every day | ORAL | Status: DC

## 2012-11-14 NOTE — Assessment & Plan Note (Signed)
She reports having productive cough where she is bringing greenish phlegm for last 1 month. It was also associated with blood-streaked sputum in the initial period which has resolved now. I think this is most likely bronchitis but TB is also in the differential with the questionable history of low-grade fevers and her country of origin. -Obtain a chest x-ray.  Update: Her chest x-ray does not show any cavitary lesion to support the diagnosis of TB.   We'll treat her with Zithromax for bronchitis.

## 2012-11-14 NOTE — Assessment & Plan Note (Signed)
Stable. Her prescription was refilled.

## 2012-11-14 NOTE — Assessment & Plan Note (Signed)
Her comedonic and cystic lesions on the face are likely consistent with acne. She was given a prescription for minocycline for 15 days. She has used that medication in the past without any side effects. -She was again educated on probable side effects including photosensitivity, rash etc

## 2012-11-14 NOTE — Patient Instructions (Addendum)
Please schedule a follow up appointment in 1 month for BP recheck . Please bring your medication bottles with your next appointment. Please take your medicines as prescribed. I will call you with your lab results if anything will be abnormal.

## 2012-11-14 NOTE — Assessment & Plan Note (Signed)
Patient reports having episodes that he describes as" quite " the middle of normal conversation and would not remember anything after that,  lasting for 5 seconds. She also reports having occasional right hand tingling and numbness with right-sided headaches but these two events are unrelated. Her description is consistent with absence seizure but it seems less likely given her age of onset. Other possibility include infection like toxoplasmosis versus tumor. She had similar episode and was evaluated in the clinic and sent to the ED for further workup. CT scan was obtained which was completely normal. -Would get MRI of brain to rule out any infection or tumor as the cause for her symptoms. -Schedule her for EEG. -Check HIV -Neurology referral if possible with [facility] card.

## 2012-11-14 NOTE — Assessment & Plan Note (Signed)
Blood pressure is moderately elevated today in the setting of not taking her blood pressure medicines for last 3 days. She was supposed to be on lisinopril but apparently it caused her head shaking so she stopped it within the 2 weeks of starting the medicine. She was taking amlodipine that her daughter brought from her country but she ran out for last 3 days. -Continue her on amlodipine for now. -Reschedule a followup appointment in 2 weeks for blood pressure recheck.   BP Readings from Last 3 Encounters:  11/14/12 162/107  03/16/12 145/80  03/14/12 198/123

## 2012-11-14 NOTE — Progress Notes (Signed)
Subjective:   Patient ID: Ashley Huynh female   DOB: [birth year] 46 y.o.   MRN: [id number]  HPI: 46 year old woman, moved from [place] about 7 years ago  with past medical history significant for hypertension presents to the clinic for a followup visit.  HTN: She has not taken any BP meds for last 3 days. She was suppose to be on lisinopril 40 mg as per records and last clinic visit. But she states that it caused her to have head shaking , so she stopped taking this medicine within 2 weeks. She brought medication bottles with her - that included old prescription amlodipine bottle from [facility] but states that her daughter brought more medicine from [place] that she was taking until they ran out 3 days ago. She is  requesting refills. She reports having headache.Denies dizziness, blurry vision.  Cough: She reports having cough- bringing up greenish phlegm for 1 month. She was bringing up blood streaked sputum initially for first week but has stopped now. Reports low grade fever but denies any night sweats. She has lost 5 lbs since her last visit.   ?Seizure: She states that sometimes she become quite in the middle of her work or normal conversation with people- does not respond to people's conversations or questions and would be back to normal herself in 5-10 minutes. She does not remember any thing during these episodes. This problem is going on for 1 year but is becoming more frequent in the last month - had 2 episodes in last month. 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 Denies any body witnessing any shaking, loss of bladder or bowel incontinence, tongue biting. She reports off and on tingling in her right hand and head shaking occasionally. She was also seen in the ED for similar problem in 03/13. Had CT scan around that time that was negative.    Past Medical History    Diagnosis Date  . Hypertension    Family History  Problem Relation Age of Onset  . Hypertension Mother   . Hypertension Father    History   Social History  . Marital Status: Married    Spouse Name: N/A    Number of Children: N/A  . Years of Education: N/A   Occupational History  . Not on file.   Social History Main Topics  . Smoking status: Never Smoker   . Smokeless tobacco: Not on file  . Alcohol Use: No  . Drug Use: No  . Sexually Active: Not on file   Other Topics Concern  . Not on file   Social History Narrative  . No narrative on file   Review of Systems: General: Denies fever, chills, diaphoresis, appetite change and fatigue. HEENT: Denies photophobia, eye pain, redness, hearing loss, ear pain, congestion, sore throat, rhinorrhea, sneezing, mouth sores, trouble swallowing, neck pain, neck stiffness and tinnitus. Respiratory: Denies SOB, DOE, and wheezing, +cough, +chest tightness. Cardiovascular: Denies to chest pain, palpitations and leg swelling. Gastrointestinal: Denies nausea, vomiting, abdominal pain, diarrhea, constipation, blood in stool and abdominal distention. Genitourinary: Denies dysuria, urgency, frequency, hematuria, flank pain and difficulty urinating. Musculoskeletal: Denies myalgias, back pain, joint swelling, arthralgias and gait problem.  Skin: Denies pallor, rash and wound. Neurological: Denies dizziness, seizures, syncope, weakness, light-headedness, numbness and + headaches. Hematological: Denies adenopathy, easy bruising, personal or family bleeding history. Psychiatric/Behavioral: Denies  suicidal ideation, mood changes, confusion, nervousness, sleep disturbance and agitation.    Current Outpatient Medications: Current Outpatient Prescriptions  Medication Sig Dispense Refill  . lisinopril (PRINIVIL,ZESTRIL) 40 MG tablet Take 1 tablet (40 mg total) by mouth daily.  31 tablet  3  . omeprazole (PRILOSEC) 40 MG capsule Take 1 capsule (40  mg total) by mouth daily.  90 capsule  3  . potassium chloride (K-DUR) 10 MEQ tablet Take 4 tablets every 4 hours x 3 doses  12 tablet  0  . [DISCONTINUED] lisinopril-hydrochlorothiazide (PRINZIDE) 20-12.5 MG per tablet Take 2 tablets by mouth daily.  60 tablet  11    Allergies: No Known Allergies    Objective:   Physical Exam: Filed Vitals:   11/14/12 0921  BP: 162/107  Pulse: 70  Temp: 97.9 F (36.6 C)    General: Vital signs reviewed and noted. Well-developed, well-nourished, in no acute distress; alert, appropriate and cooperative throughout examination. Head: Normocephalic, atraumatic Lungs: Normal respiratory effort. Coarse breath sounds bilaterally,  BL without crackles or wheezes. Heart: RRR. S1 and S2 normal without gallop, murmur, or rubs. Abdomen:BS normoactive. Soft, Nondistended, non-tender.  No masses or organomegaly. Extremities: No pretibial edema.     Assessment & Plan:

## 2012-11-15 ENCOUNTER — Ambulatory Visit: Payer: Self-pay | Admitting: Internal Medicine

## 2012-11-15 ENCOUNTER — Ambulatory Visit (HOSPITAL_COMMUNITY)
Admission: RE | Admit: 2012-11-15 | Discharge: 2012-11-15 | Disposition: A | Discharge status: Home / Self Care | Payer: Self-pay | Source: Ambulatory Visit | Attending: Internal Medicine | Admitting: Internal Medicine

## 2012-11-15 DIAGNOSIS — R569 Unspecified convulsions: Secondary | ICD-10-CM | POA: Insufficient documentation

## 2012-11-15 DIAGNOSIS — R51 Headache: Secondary | ICD-10-CM | POA: Insufficient documentation

## 2012-11-15 DIAGNOSIS — R209 Unspecified disturbances of skin sensation: Secondary | ICD-10-CM | POA: Insufficient documentation

## 2012-11-15 MED ORDER — GADOBENATE DIMEGLUMINE 529 MG/ML IV SOLN
13.0000 mL | Freq: Once | INTRAVENOUS | Status: AC | PRN
  Administered 2012-11-15: 13 mL via INTRAVENOUS

## 2012-11-23 ENCOUNTER — Telehealth: Payer: Self-pay | Admitting: *Deleted

## 2012-11-23 ENCOUNTER — Ambulatory Visit (HOSPITAL_COMMUNITY)
Admission: RE | Admit: 2012-11-23 | Discharge: 2012-11-23 | Disposition: A | Discharge status: Home / Self Care | Payer: No Typology Code available for payment source | Source: Ambulatory Visit | Attending: Internal Medicine | Admitting: Internal Medicine

## 2012-11-23 DIAGNOSIS — R569 Unspecified convulsions: Secondary | ICD-10-CM

## 2012-11-23 NOTE — Telephone Encounter (Signed)
Pt stopped by the clinic after having her EEG.  She is very tearful and concerned about these test results. I called EEG and they said it would be Friday or Monday before we get results.  I told pt you would call her As soon as you get the results. I noted the MRI results are back.  Would you have time to call her with these results today? It may make her feel better. Pt # [id number]

## 2012-11-23 NOTE — Progress Notes (Signed)
Outpatient EEG completed

## 2012-11-23 NOTE — Telephone Encounter (Signed)
I called patient who reports that she is doing good. No new issues.  I told her about the  MRI-brain results, which is basically negative for any acute issues. She is happy about this result. I told her that I will let her know her EEG result as soon as it is available.   Lorretta Harp, MD PGY2, Internal Medicine Teaching Service Pager: [phone]

## 2012-11-24 NOTE — Procedures (Signed)
ELECTROENCEPHALOGRAM REPORT   Patient: Ashley Huynh       Room #: OP EEG No. ID: [id number] Age: 46 y.o.        Sex: female Referring Physician: Dorthula Rue Report Date:  11/24/2012        Interpreting Physician: Thana Farr D  History: [name] is an 46 y.o. female with history of starring episodes  Medications:  Lisinopril, Prilosec, Potassium  Conditions of Recording:  This is a 16 channel EEG carried out with the patient in the awake and drowsy states.  Description:  The waking background activity consists of a low voltage, symmetrical, fairly well organized, 10 Hz alpha activity, seen from the parieto-occipital and posterior temporal regions.  Low voltage fast activity, poorly organized, is seen anteriorly and is at times superimposed on more posterior regions.  A mixture of theta and alpha rhythms are seen from the central and temporal regions. The patient drowses with slowing to irregular, low voltage theta and beta activity.   Stage II sleep is not obtained. Hyperventilation produced a mild to moderate buildup but failed to elicit any abnormalities.  Intermittent photic stimulation was performed and elicits a symmetrical driving response but fails to elicit any abnormalities.  IMPRESSION: Normal electroencephalogram, awake, drowsy and with activation procedures. There are no focal lateralizing or epileptiform features.   Thana Farr, MD [facility] [phone] 11/24/2012, 7:50 AM

## 2013-01-02 ENCOUNTER — Encounter: Payer: Self-pay | Admitting: Internal Medicine

## 2013-01-09 ENCOUNTER — Encounter: Payer: No Typology Code available for payment source | Admitting: Internal Medicine

## 2013-01-10 ENCOUNTER — Encounter: Payer: Self-pay | Admitting: Internal Medicine

## 2013-01-10 ENCOUNTER — Ambulatory Visit (INDEPENDENT_AMBULATORY_CARE_PROVIDER_SITE_OTHER): Payer: No Typology Code available for payment source | Admitting: Internal Medicine

## 2013-01-10 VITALS — BP 125/91 | HR 88 | Temp 97.3°F | Wt 150.5 lb

## 2013-01-10 DIAGNOSIS — K219 Gastro-esophageal reflux disease without esophagitis: Secondary | ICD-10-CM

## 2013-01-10 DIAGNOSIS — R569 Unspecified convulsions: Secondary | ICD-10-CM

## 2013-01-10 DIAGNOSIS — Z0000 Encounter for general adult medical examination without abnormal findings: Secondary | ICD-10-CM | POA: Insufficient documentation

## 2013-01-10 DIAGNOSIS — Z1231 Encounter for screening mammogram for malignant neoplasm of breast: Secondary | ICD-10-CM

## 2013-01-10 DIAGNOSIS — Z23 Encounter for immunization: Secondary | ICD-10-CM

## 2013-01-10 DIAGNOSIS — I10 Essential (primary) hypertension: Secondary | ICD-10-CM

## 2013-01-10 NOTE — Assessment & Plan Note (Signed)
Patient had CT head on 03/07/12 and MRI brain on 11/23/12 which did not show any intracranial abnormalities. She also had an EEG on 11/23/12 which was normal. Patient reports that her seizure-like activity has resolved recently. She does not have weakness, numbness, or decreased sensations in her extremities. No focal neurologic signs on physical examination. Patient HIV antibody was negative. No further treatment indicated at this moment. Patient is instructed to come back to the hospital if her symptoms relapse.

## 2013-01-10 NOTE — Assessment & Plan Note (Signed)
BP Readings from Last 3 Encounters:  01/10/13 125/91  11/14/12 162/107  03/16/12 145/80    Lab Results  Component Value Date   NA 137 11/14/2012   K 3.5 11/14/2012   CREATININE 0.57 11/14/2012    Assessment:  Blood pressure control: mildly elevated  Progress toward BP goal:  improved  Comments:  Plan:  Medications:  continue current medications  Educational resources provided: brochure  Self management tools provided: home blood pressure logbook  Other plans: Blood pressure is better controlled. Today blood pressure is 125/91. Will continue current regimen.

## 2013-01-10 NOTE — Assessment & Plan Note (Signed)
-  Will do flu shot today -Give referral for mammogram -Patient would like to have a lady doctor to do Pap smear for her. Will postpone today.

## 2013-01-10 NOTE — Patient Instructions (Signed)
1. You have done great job in taking all your medications. I appreciate it very much. Please continue doing that. 2. Please take all medications as prescribed.  3. If you have worsening of your symptoms or new symptoms arise, please call the clinic ([phone]), or go to the ER immediately if symptoms are severe.     

## 2013-01-10 NOTE — Progress Notes (Signed)
Patient ID: Mickenzie Stolar, female   DOB: 04/06/1966, 47 y.o.   MRN: [id number] Subjective:   Patient ID: Nimsi Males female   DOB: 03/07/1966 46 y.o.   MRN: [id number]  CC: Follow up visit.   HPI:  Ms.Faylene Nolton is a 47 y.o. lady with past medical history as outlined below, who presents for a followup visit.     1.) HTN: patient is taking amlodipine 10 mg daily. She does not have chest pain, shortness of breath, palpitation or leg edema. Her blood pressure is better controlled. Her blood pressure is 125/91 today.  2.) Seizure-like activity: Patient had seizure like activity in the past. She had CT head on 03/07/12 and MRI brain on 11/23/12 which did not show any intracranial abnormalities. She also had an EEG on 11/23/12 which was normal. Patient reports that her seizure-like activity has resolved recently. She does not have weakness, numbness, or decreased sensations in her extremities. She is feeling good.   3.) GERD: it is well controlled with omeprazole. She dose not have symptoms of acid reflux.   Past Medical History  Diagnosis Date  . Hypertension    Current Outpatient Prescriptions  Medication Sig Dispense Refill  . amLODipine (NORVASC) 10 MG tablet Take 1 tablet (10 mg total) by mouth daily.  30 tablet  3  . omeprazole (PRILOSEC) 40 MG capsule Take 1 capsule (40 mg total) by mouth daily.  90 capsule  3  . [DISCONTINUED] lisinopril-hydrochlorothiazide (PRINZIDE) 20-12.5 MG per tablet Take 2 tablets by mouth daily.  60 tablet  11   Family History  Problem Relation Age of Onset  . Hypertension Mother   . Hypertension Father    History   Social History  . Marital Status: Married    Spouse Name: N/A    Number of Children: N/A  . Years of Education: N/A   Social History Main Topics  . Smoking status: Never Smoker   . Smokeless tobacco: Not on file  . Alcohol Use: No  . Drug Use: No  . Sexually Active: Not on file   Other Topics Concern  . Not on file   Social  History Narrative  . No narrative on file    Review of Systems: General: no fevers, chills, no changes in body weight, no changes in appetite Skin: no rash HEENT: no blurry vision, hearing changes or sore throat Pulm: no dyspnea, coughing, wheezing CV: no chest pain, palpitations, shortness of breath Abd: no nausea/vomiting, abdominal pain, diarrhea/constipation GU: no dysuria, hematuria, polyuria Ext: no arthralgias, myalgias Neuro: no weakness, numbness, or tingling  Objective:  Physical Exam: Filed Vitals:   01/10/13 0924  BP: 125/91  Pulse: 88  Temp: 97.3 F (36.3 C)  TempSrc: Oral  Weight: 150 lb 8 oz (68.266 kg)  SpO2: 99%   General: Not in acute distress HEENT: PERRL, EOMI, no scleral icterus, No bruit or JVD Cardiac: S1/S2, RRR, No murmurs, gallops or rubs Pulm: Good air movement bilaterally, Clear to auscultation bilaterally, No rales, wheezing, rhonchi or rubs. Abd: Soft,  nondistended, nontender, no rebound pain, no organomegaly, BS present Ext: No rashes or edema, 2+DP/PT pulse bilaterally Musculoskeletal: No joint deformities, erythema, or stiffness, ROM full and nontender Skin: no rashes. No skin bruise. Neuro: Alert and oriented X3, cranial nerves II-XII grossly intact, muscle strength 5/5 in all extremeties,  sensation to light touch intact. Brachial reflexes 1+ bilaterally. Knee reflexes 2+ bilaterally.  Psych: patient is not psychotic, no suicidal or hemocidal ideation.  Assessment & Plan:

## 2013-01-10 NOTE — Assessment & Plan Note (Signed)
It is well controlled with omeprazole. Continue current regimen.

## 2013-01-12 ENCOUNTER — Ambulatory Visit (HOSPITAL_COMMUNITY)
Admission: RE | Admit: 2013-01-12 | Discharge: 2013-01-12 | Disposition: A | Discharge status: Home / Self Care | Payer: No Typology Code available for payment source | Source: Ambulatory Visit | Attending: Internal Medicine | Admitting: Internal Medicine

## 2013-01-12 DIAGNOSIS — Z0000 Encounter for general adult medical examination without abnormal findings: Secondary | ICD-10-CM

## 2013-01-12 DIAGNOSIS — Z1231 Encounter for screening mammogram for malignant neoplasm of breast: Secondary | ICD-10-CM | POA: Insufficient documentation

## 2013-02-26 ENCOUNTER — Encounter: Payer: Self-pay | Admitting: *Deleted

## 2013-02-26 ENCOUNTER — Encounter: Payer: Self-pay | Admitting: Internal Medicine

## 2013-02-26 ENCOUNTER — Ambulatory Visit (INDEPENDENT_AMBULATORY_CARE_PROVIDER_SITE_OTHER): Payer: No Typology Code available for payment source | Admitting: Internal Medicine

## 2013-02-26 VITALS — BP 133/89 | HR 77 | Temp 98.1°F | Wt 155.0 lb

## 2013-02-26 DIAGNOSIS — H5711 Ocular pain, right eye: Secondary | ICD-10-CM

## 2013-02-26 DIAGNOSIS — H5710 Ocular pain, unspecified eye: Secondary | ICD-10-CM

## 2013-02-26 NOTE — Progress Notes (Signed)
  Subjective:    Patient ID: Ashley Huynh, female    DOB: 27-Sep-1966, 47 y.o.   MRN: [id number]  HPI: This is a 47 year old woman with hypertension who presents to the clinic with eye pain, itching, and discharge.  Eye Pain  The right eye is affected.This is a new problem. The current episode started in the past 7 days. The problem occurs intermittently. The problem has been unchanged. There was no injury mechanism. The pain is at a severity of 7/10. The pain is severe. There is no known exposure to pink eye. She does not wear contacts. Associated symptoms include blurred vision, an eye discharge, itching and photophobia. Pertinent negatives include no double vision, eye redness, fever, foreign body sensation, nausea, recent URI or vomiting. Associated symptoms comments: Crusty discharge in the mornings. She has tried nothing for the symptoms.      Review of Systems  Constitutional: Negative for fever.  HENT: Negative for ear pain, congestion, sore throat, postnasal drip and ear discharge.   Eyes: Positive for blurred vision, photophobia, pain and discharge. Negative for double vision and redness.  Respiratory: Negative for cough.   Gastrointestinal: Negative for nausea and vomiting.  Skin: Positive for itching.  Neurological: Negative for headaches.       Objective:   Physical Exam:  GENERAL: well developed, well nourished; no acute distress HEAD: atraumatic, normocephalic, tenderness with palpation of the right temporal area EYES: pupils equal, round and reactive; sclera anicteric; normal conjunctiva; tenderness with palpation of the medial palpebra and along the tract of the nasolacrimal duct; no discharge before or after palpation; no foreign body identified EARS: canals patent and TMs normal bilaterally NOSE: normal nasal mucosa and turbinates, no exudates NOSE/THROAT: oropharynx clear, moist mucous membranes, pink gingiva, normal dentition NECK: supple LYMPH: no cervical or  supraclavicular lymphadenopathy LUNGS: clear to auscultation bilaterally, normal work of breathing HEART: normal rate and regular rhythm; normal S1 and S2 without S3 or S4; no murmurs, rubs, or clicks PULSES: radial 2+ and symmetric SKIN: warm, dry, intact, normal turgor, no rashes EXTREMITIES: no peripheral edema, clubbing, or cyanosis CRANIAL NERVES: pupils reactive to light bilaterally; extra occular muscles are intact; facial sensation is intact and equal bilaterally in V1, V2, and V3; smile is symmetric; hearing is decreased in the right ear by tuning fork, Weber test lateralizes to the left; uvula is midline and palate elevates symmetrically; tongue protrudes midline   Filed Vitals:   02/26/13 1100  BP: 133/89  Pulse: 77  Temp: 98.1 F (36.7 C)    BP Readings from Last 3 Encounters:  02/26/13 133/89  01/10/13 125/91  11/14/12 162/107          Assessment & Plan:

## 2013-02-26 NOTE — Assessment & Plan Note (Signed)
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 The tenderness with palpation along the nasolacrimal duct along with discharge and crusting on the right side is consistent with this process. I don't see evidence of bacterial or viral conjunctivitis, and unilateral symptoms is not consistent with allergic conjunctivitis. I have called her ophthalmologist and scheduled her for an appointment tomorrow at 9 AM. The patient says she will be able to attend this appointment. I have recommended warm, moist compresses in the meantime.

## 2013-02-26 NOTE — Progress Notes (Signed)
Pt walked into clinic with c/o stabbing pain to rt eye, on and off for 3 days .  Also has itching feeling and d/c in morning. No treatment. Does state vision is blurred at times.  Same problem a year ago and told it was dry eye.  She does have an eye doctor. Dr [place] but pt has [facility] card and needs a referral

## 2013-02-26 NOTE — Patient Instructions (Signed)
You have an appointment with Dr. Laural Benes at [facility] tomorrow (Tuesday, March 10) at 9:00 AM.  In the meantime, apply a warm, moist cloth to the right eye for 15 minutes three times a day until your ophthalmology appointment tomorrow.

## 2013-03-23 ENCOUNTER — Telehealth: Payer: Self-pay | Admitting: *Deleted

## 2013-03-23 NOTE — Telephone Encounter (Signed)
[facility]. Office dr m.johnson calls requesting labs for pt, she will fax visit note and request, pt will bring written request on script from dr Laural Benes

## 2013-03-26 ENCOUNTER — Other Ambulatory Visit (INDEPENDENT_AMBULATORY_CARE_PROVIDER_SITE_OTHER): Payer: No Typology Code available for payment source

## 2013-03-26 ENCOUNTER — Other Ambulatory Visit: Payer: Self-pay | Admitting: Internal Medicine

## 2013-03-26 DIAGNOSIS — H20029 Recurrent acute iridocyclitis, unspecified eye: Secondary | ICD-10-CM

## 2013-03-26 DIAGNOSIS — H20021 Recurrent acute iridocyclitis, right eye: Secondary | ICD-10-CM

## 2013-03-26 LAB — CBC WITH DIFFERENTIAL/PLATELET
Basophils Absolute: 0 10*3/uL (ref 0.0–0.1)
Basophils Relative: 1 % (ref 0–1)
Eosinophils Relative: 1 % (ref 0–5)
Lymphocytes Relative: 29 % (ref 12–46)
MCHC: 33.9 g/dL (ref 30.0–36.0)
MCV: 84.5 fL (ref 78.0–100.0)
Neutro Abs: 3.4 10*3/uL (ref 1.7–7.7)
Platelets: 321 10*3/uL (ref 150–400)
RDW: 14.1 % (ref 11.5–15.5)
WBC: 5.6 10*3/uL (ref 4.0–10.5)

## 2013-03-27 LAB — RPR

## 2013-03-30 ENCOUNTER — Other Ambulatory Visit: Payer: Self-pay | Admitting: *Deleted

## 2013-03-30 DIAGNOSIS — I10 Essential (primary) hypertension: Secondary | ICD-10-CM

## 2013-03-30 LAB — FLUORESCENT TREPONEMAL AB(FTA)-IGG-BLD: Fluorescent Treponemal ABS: NONREACTIVE

## 2013-03-30 NOTE — Progress Notes (Signed)
FTA results final -  Faxed results to [facility] - Dr Ashok Cordia [address] PBT  03-30-13  [phone]

## 2013-03-30 NOTE — Progress Notes (Signed)
Results faxed to [facility] - Dr Ashok Cordia  [phone] 03-29-2013 at approx. 830a by Levy Sjogren, PBT  FTA pending will fax final upon completion.  Alric Quan, PBT  Clinic Lab 03-30-2013 [phone]

## 2013-04-01 MED ORDER — AMLODIPINE BESYLATE 10 MG PO TABS: 10.0000 mg | ORAL_TABLET | Freq: Every day | ORAL | Status: DC

## 2013-04-03 NOTE — Telephone Encounter (Signed)
Amlodipine rx refilled - [facility].

## 2013-04-18 ENCOUNTER — Other Ambulatory Visit: Payer: Self-pay | Admitting: Internal Medicine

## 2013-04-18 ENCOUNTER — Encounter: Payer: Self-pay | Admitting: *Deleted

## 2013-04-18 DIAGNOSIS — N3090 Cystitis, unspecified without hematuria: Secondary | ICD-10-CM

## 2013-04-18 MED ORDER — SULFAMETHOXAZOLE-TRIMETHOPRIM 800-160 MG PO TABS: 1.0000 | ORAL_TABLET | Freq: Two times a day (BID) | ORAL | Status: DC

## 2013-04-18 NOTE — Progress Notes (Signed)
Pt walked into clinic with c/o severe headache for 10 days.  She is taking ASA with no relief.  She is seeing eye doctor  for vision problems.  Denies nausea.  Pain is constant, rates 8/10.  [facility], Also c/o dysuria for 3 weeks, with frequency.

## 2013-04-18 NOTE — Progress Notes (Signed)
We currently have no openings to formally assess Ashley Huynh today.  I did ask her a few questions.  She notes several days of urgency and dysuria including burning on urination.  She states that in the past she has had similar symptoms which resolved with a course of antibiotics.  As this is similar and consistent with a UTI it is very reasonable to empirically treat her for an uncomplicated UTI on historical grounds.  I will therefore provide her with Bactrim twice a day for 3 days.  She does not have much money but states she can afford the $4 at [employer].  She also complains of a bilateral pounding headache associated with photophobia and phonophobia which was previously responsive to ASA but has not been responsive for the last month.  She denies any trauma, history of glaucoma, or fevers.  I recommended that she purchase Excedrin Migraine or the generic equivalent and assess it's efficacy as I am concerned she may be having migraine headaches.  If that is ineffective, I recommended that she try ibuprofen as needed for headaches.  We were able to get her into an appointment early next week which fits her schedule to assess the effectiveness of the OTC regimen and allow [place] time to more fully evaluate her headaches.  At that visit, we can also assess if her dysuria and urgency has resolved with the antibiotic course.  She was satisfied with this plan and the follow-up appointment.

## 2013-04-24 ENCOUNTER — Ambulatory Visit (INDEPENDENT_AMBULATORY_CARE_PROVIDER_SITE_OTHER): Payer: No Typology Code available for payment source | Admitting: Internal Medicine

## 2013-04-24 ENCOUNTER — Encounter: Payer: Self-pay | Admitting: Internal Medicine

## 2013-04-24 VITALS — BP 140/92 | HR 93 | Temp 97.9°F | Ht 62.0 in | Wt 163.9 lb

## 2013-04-24 DIAGNOSIS — G250 Essential tremor: Secondary | ICD-10-CM | POA: Insufficient documentation

## 2013-04-24 DIAGNOSIS — B370 Candidal stomatitis: Secondary | ICD-10-CM | POA: Insufficient documentation

## 2013-04-24 DIAGNOSIS — I10 Essential (primary) hypertension: Secondary | ICD-10-CM

## 2013-04-24 HISTORY — DX: Essential tremor: G25.0

## 2013-04-24 MED ORDER — PROPRANOLOL HCL 40 MG PO TABS: 40.0000 mg | ORAL_TABLET | Freq: Two times a day (BID) | ORAL | Status: DC

## 2013-04-24 MED ORDER — CLOTRIMAZOLE 10 MG MT TROC: 10.0000 mg | Freq: Every day | OROMUCOSAL | Status: DC

## 2013-04-24 NOTE — Patient Instructions (Addendum)
Please make a follow up appointment in 2-3 months.  For white tongue coating-  Take clotrimazole 10 mg troche and dissolve in mouth 5 times daily- for 7 days.  For head tremor- start taking propranolol 40 mg twice daily.  Continue taking amlodipine 10 mg daily for blood pressure.

## 2013-04-24 NOTE — Assessment & Plan Note (Signed)
BP Readings from Last 3 Encounters:  04/24/13 140/92  02/26/13 133/89  01/10/13 125/91    Lab Results  Component Value Date   NA 137 11/14/2012   K 3.5 11/14/2012   CREATININE 0.57 11/14/2012    Assessment: Blood pressure control:   at goal Progress toward BP goal:    mild worsening Comments:   Plan: Medications:  continue current medications, Continue amlodipine and start propranolol 40 mg twice daily. Educational resources provided: Comptroller tools provided:   Other plans: None.

## 2013-04-24 NOTE — Progress Notes (Signed)
  Subjective:    Patient ID: Ashley Huynh, female    DOB: [birth year], 47 y.o.   MRN: [id number]  HPI patient is a pleasant 47 year woman with hypertension, uveitis, GERD and other problems as per problem list who comes the clinic forwhite tongue coating and head tremor.  She describes a white coating for past 3-4 days. She has dry mouth. She is not immuno-compromised Her recent HIV and RPR test has been negative. She also describes some dysphagia but no odynophagia. Denies any fever, chills, nausea vomiting, headache, chest pain, palpitation, diarrhea, urinary symptoms.  She is a describes head tremor for about more than a year now. She has intermittent nodding of her head at rest- to and fro. It gets worse with tiredness and in some social situations.  She does not drink alcohol. Does not think there is an association with caffeine or tea intake. No family history of this kind of tremors. Also describes right hand intermittent tremor at rest.  Denies any neck pain, pain radiating to the of, tingling numbness of upper or lower extremities. She had a recent MRI head which was negative for acute pathology.  Review of Systems    as per history of present illness. Objective:   Physical Exam  General: NAD, intermittent head tremor. HEENT: PERRL, EOMI, no scleral icterus Cardiac: S1, S2, RRR, no rubs, murmurs or gallops Pulm: clear to auscultation bilaterally, moving normal volumes of air Abd: soft, nontender, nondistended, BS present Ext: warm and well perfused, no pedal edema Neuro: alert and oriented X3, cranial nerves II-XII grossly intact       Assessment & Plan:

## 2013-04-24 NOTE — Assessment & Plan Note (Signed)
Oropharyngeal candidiasis. Secondary to dry mouth.  - Clotrimazole Troche-10 mg 5 times daily for 7 days. - Advised to keep her mouth but with getting some water every half an hour.

## 2013-04-24 NOTE — Assessment & Plan Note (Signed)
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 Does not have any family history- although this disease it usually is autosomal dominant.  But she describes classic symptoms of essential head tremor as described in history of present illness.  She has had extensive workup for seizure like activity including negative MRI, CT head and EEG.  - Propranolol 40 mg twice daily.

## 2013-04-25 NOTE — Progress Notes (Signed)
I discussed this case with Dr. Patel soon after he saw the patient , I have read the documentation and I agree with the plan of care. Please see the resident note for details of management.  

## 2013-04-29 IMAGING — CT CT HEAD W/O CM
1 of 2 series · 13 of 30 positions shown, 17 images · non-contrast
Comparison: 10/25/2006

CLINICAL DATA: Headache.  Shaking of the right hand.  Hypertension.

CT HEAD WITHOUT CONTRAST
TECHNIQUE: Contiguous axial images were obtained from the base of
the skull through the vertex without contrast.

[Series 2: brain · axial · 0.47mm/px · z∈[+20,+140]mm · 13 of 28 slices shown, 17 images]
[im 2/28  brain]
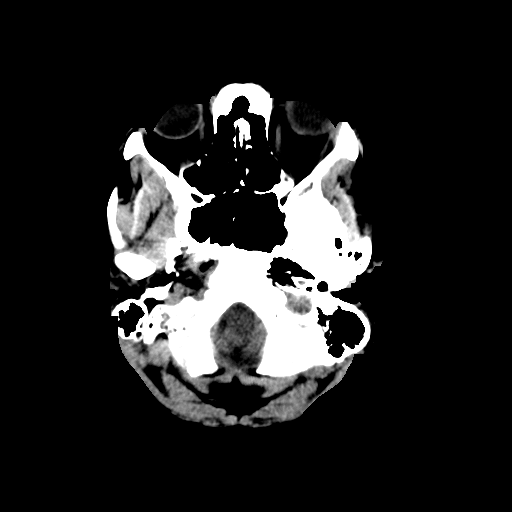
[im 2/28  bone]
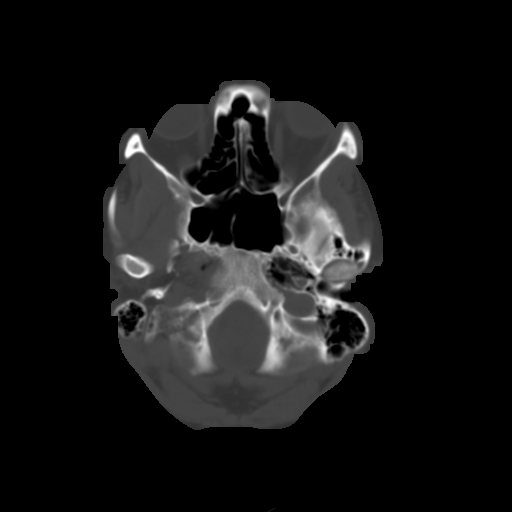
[im 4/28  brain]
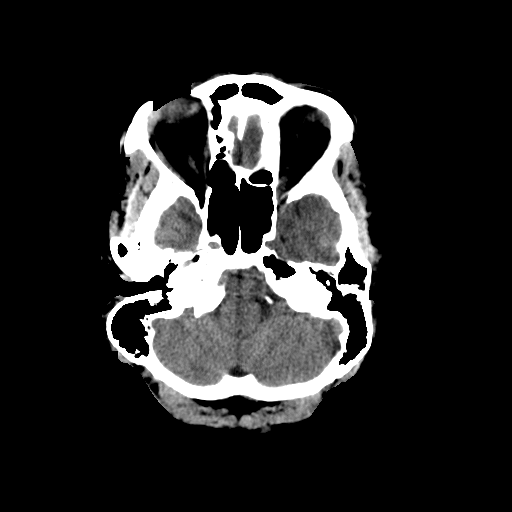
[im 6/28  brain]
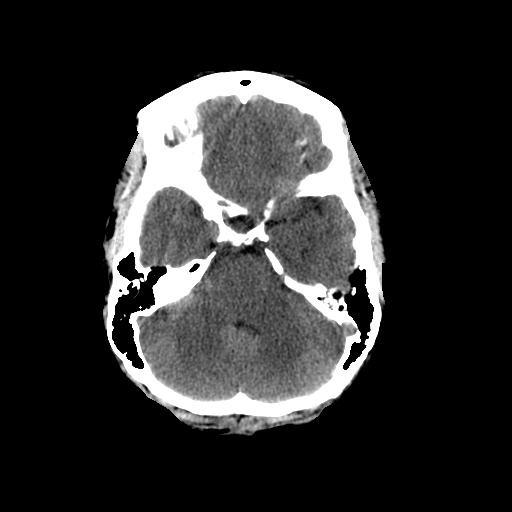
[im 8/28  brain]
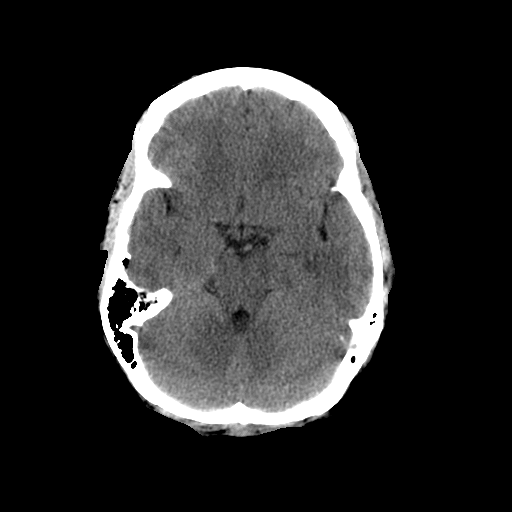
[im 10/28  brain]
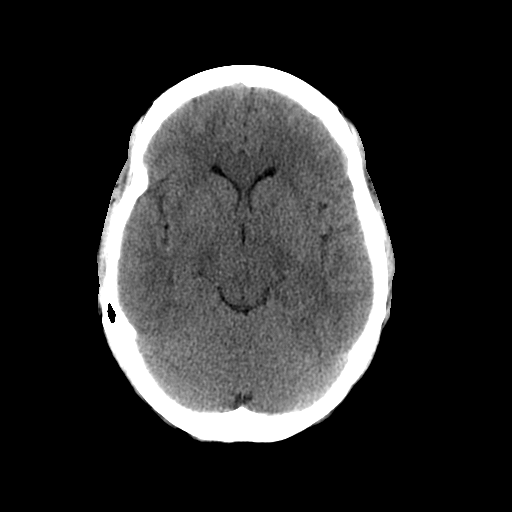
[im 10/28  bone]
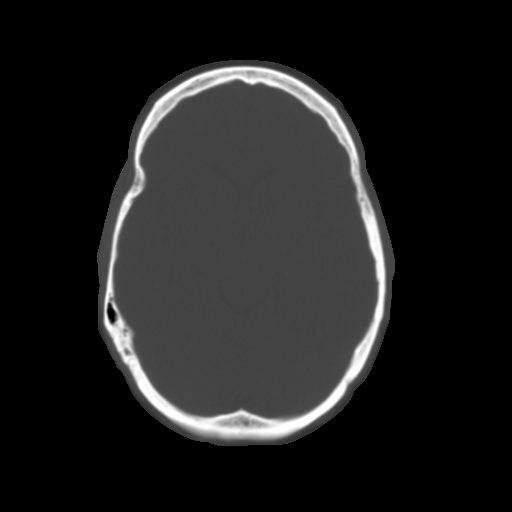
[im 12/28  brain]
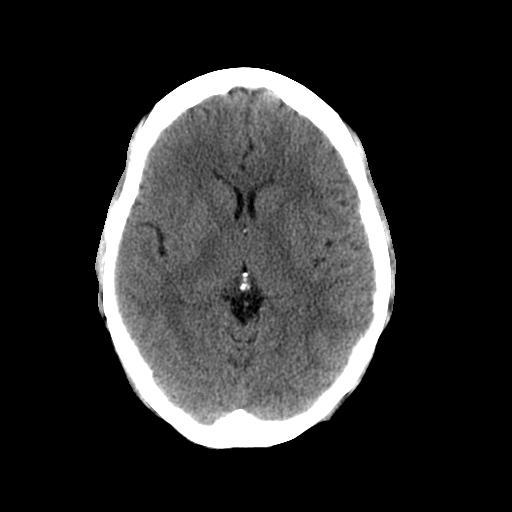
[im 14/28  brain]
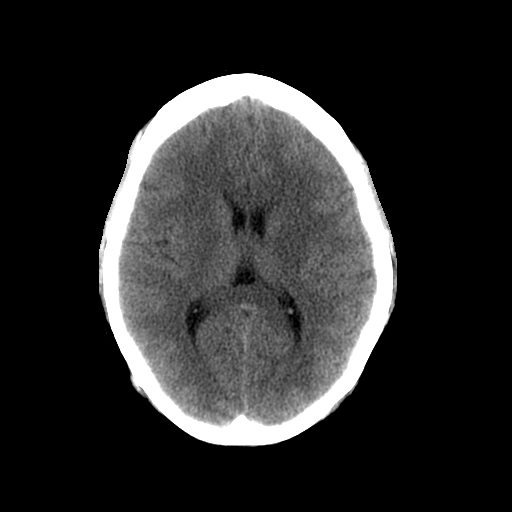
[im 16/28  brain]
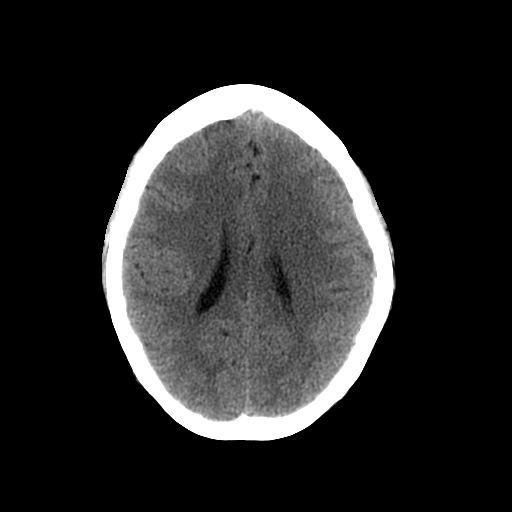
[im 18/28  brain]
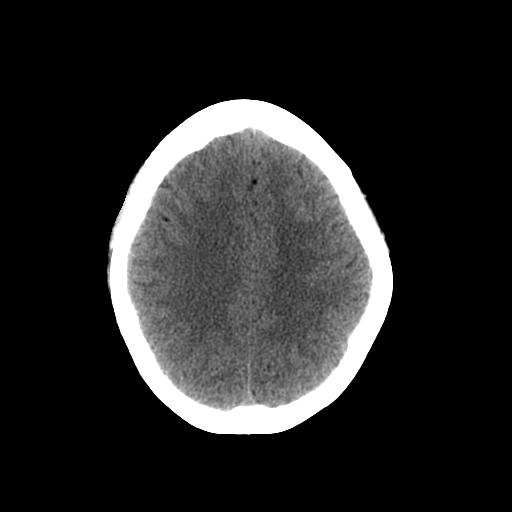
[im 18/28  bone]
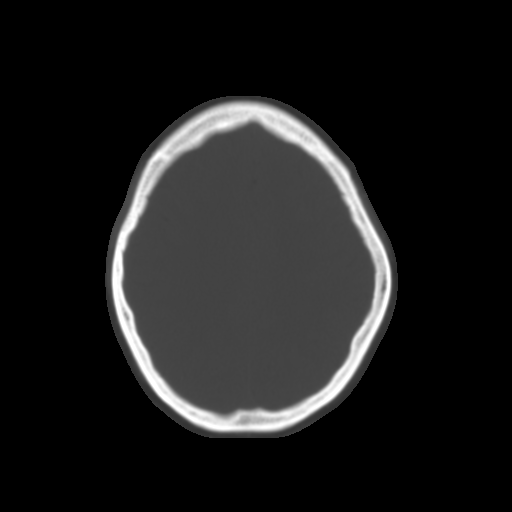
[im 20/28  brain]
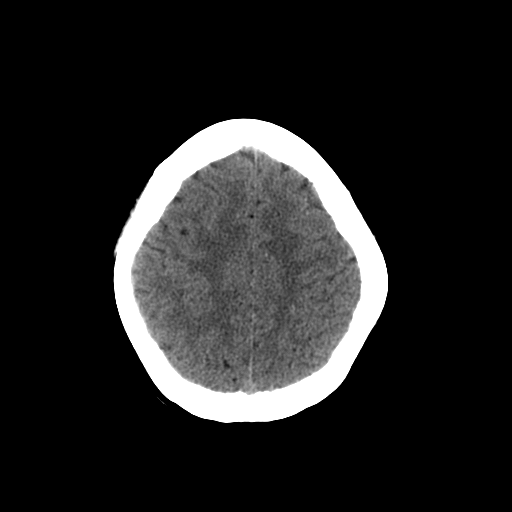
[im 22/28  brain]
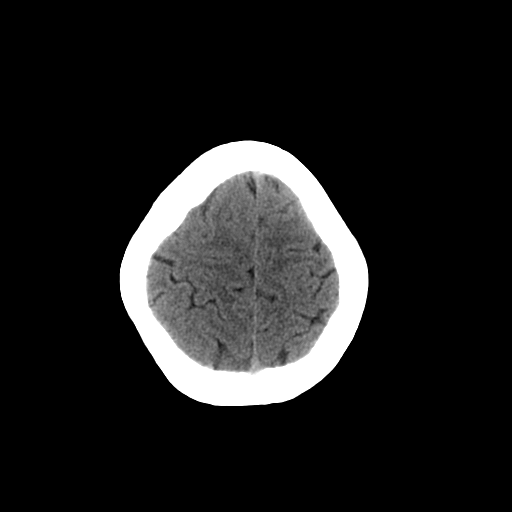
[im 24/28  brain]
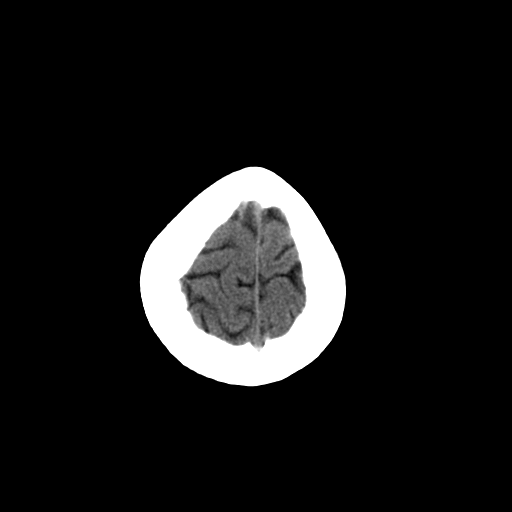
[im 26/28  brain]
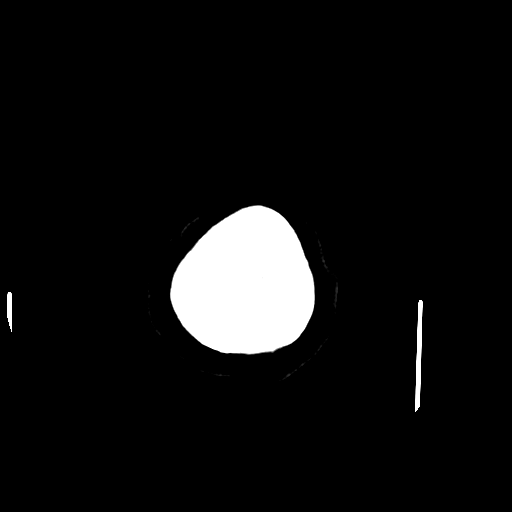
[im 26/28  bone]
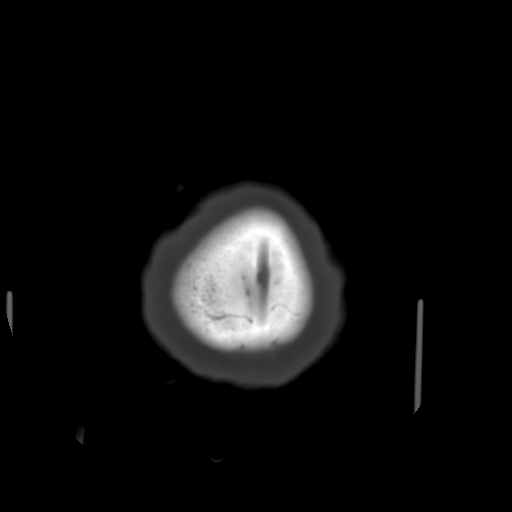

[13 of 30 positions shown; findings below may reference images not displayed]

FINDINGS: The brain stem, cerebellum, cerebral peduncles, thalami,
basal ganglia, basilar cisterns, and ventricular system appear
unremarkable.

No intracranial hemorrhage, mass lesion, or acute infarction is
identified.
IMPRESSION: 1.  No significant abnormality identified.

## 2013-05-15 ENCOUNTER — Telehealth: Payer: Self-pay | Admitting: *Deleted

## 2013-05-15 NOTE — Telephone Encounter (Signed)
Received PA request from pt's pharmacy.  Clotrimazole in non-preferred.  Contacted Medicaid at [phone] to initiate PA, preferred meds include fluconazole, nystatin, ketoconazole along with a list of others used to treat fungal infections of the nail/skin.  Can pt use nystatin oral rinse.  Please advise.Criss Alvine, Akeem Heppler Cassady5/27/20149:25 AM

## 2013-05-18 ENCOUNTER — Ambulatory Visit (INDEPENDENT_AMBULATORY_CARE_PROVIDER_SITE_OTHER): Payer: Medicaid Other | Admitting: Internal Medicine

## 2013-05-18 ENCOUNTER — Encounter: Payer: Self-pay | Admitting: Internal Medicine

## 2013-05-18 VITALS — BP 130/90 | HR 80 | Temp 97.0°F | Ht 62.0 in | Wt 162.6 lb

## 2013-05-18 DIAGNOSIS — K5732 Diverticulitis of large intestine without perforation or abscess without bleeding: Secondary | ICD-10-CM

## 2013-05-18 DIAGNOSIS — N390 Urinary tract infection, site not specified: Secondary | ICD-10-CM

## 2013-05-18 LAB — POCT URINALYSIS DIPSTICK
Leukocytes, UA: NEGATIVE
Nitrite, UA: NEGATIVE
Protein, UA: 30
Urobilinogen, UA: 0.2
pH, UA: 6

## 2013-05-18 LAB — BASIC METABOLIC PANEL
BUN: 8 mg/dL (ref 6–23)
CO2: 29 mEq/L (ref 19–32)
Chloride: 99 mEq/L (ref 96–112)
Potassium: 3 mEq/L — ABNORMAL LOW (ref 3.5–5.3)

## 2013-05-18 LAB — CBC
MCH: 29.3 pg (ref 26.0–34.0)
MCHC: 34.8 g/dL (ref 30.0–36.0)
MCV: 84.3 fL (ref 78.0–100.0)
Platelets: 301 10*3/uL (ref 150–400)
RBC: 4.71 MIL/uL (ref 3.87–5.11)

## 2013-05-18 MED ORDER — AMOXICILLIN-POT CLAVULANATE 875-125 MG PO TABS: 1.0000 | ORAL_TABLET | Freq: Two times a day (BID) | ORAL | Status: AC

## 2013-05-18 NOTE — Progress Notes (Signed)
Subjective:   Patient ID: [name] female   DOB: May 18, 1966 47 y.o.   MRN: [id number]  HPI: [name] is a 47 y.o. with a past medical history of hypertension, GERD, tremors presenting for acute bilateral flank pain and associated dysuria. Patient states that she has been on an antibiotic and whenever it is discontinued she continues to have bilateral flank pain but no dysuria or pyuria. She had previously had pyuria but that improved after the antibiotic she did not know what antibiotics she had been given. She has had on and off gross hematuria for about 2 years. No fevers/chills,/nausea vomiting/diarrhea, no significant weight loss or weight gain, no new skin rashes. She is originally from [place] and has not had similar episodes in the past. There is no family history of urinary stones and the patient has not personally had any urinary stones. Patient denied any joint pain, chest pain, dryness of breath, dyspnea on exertion, sun sensitivity, or menorrhagia.the patient is nonsmoker and both the family and social history were reviewed personally with the patient along with obtaining outside records from both the pharmacy and urgent care centers.    Past Medical History  Diagnosis Date  . Hypertension    Current Outpatient Prescriptions  Medication Sig Dispense Refill  . amLODipine (NORVASC) 10 MG tablet Take 1 tablet (10 mg total) by mouth daily.  30 tablet  11  . clotrimazole (MYCELEX) 10 MG troche Take 1 tablet (10 mg total) by mouth 5 (five) times daily.  35 tablet  1  . omeprazole (PRILOSEC) 40 MG capsule Take 1 capsule (40 mg total) by mouth daily.  90 capsule  3  . propranolol (INDERAL) 40 MG tablet Take 1 tablet (40 mg total) by mouth 2 (two) times daily.  60 tablet  2  . [DISCONTINUED] lisinopril-hydrochlorothiazide (PRINZIDE) 20-12.5 MG per tablet Take 2 tablets by mouth daily.  60 tablet  11   No current facility-administered medications for this visit.   Family History   Problem Relation Age of Onset  . Hypertension Mother   . Hypertension Father    History   Social History  . Marital Status: Married    Spouse Name: N/A    Number of Children: N/A  . Years of Education: N/A   Social History Main Topics  . Smoking status: Never Smoker   . Smokeless tobacco: None  . Alcohol Use: No  . Drug Use: No  . Sexually Active: None   Other Topics Concern  . None   Social History Narrative  . None   Review of Systems: full 10 pt ROS performed and otherwise negative unless listed in HPI  Objective:  Physical Exam: Filed Vitals:   05/18/13 1420  BP: 130/90  Pulse: 80  Temp: 97 F (36.1 C)  TempSrc: Oral  Height: 5\' 2"  (1.575 m)  Weight: 162 lb 9.6 oz (73.755 kg)  SpO2: 98%   General: sitting in chair, some acute distress HEENT: PERRL, EOMI, no scleral icterus Cardiac: RRR, no rubs, murmurs or gallops Pulm: clear to auscultation bilaterally, moving normal volumes of air Abd: soft, nontender, nondistended, BS present,  Ext: warm and well perfused, no pedal edema, CVA tenderness bilaterally Neuro: alert and oriented X3, cranial nerves II-XII grossly intact  Assessment & Plan:  1. Dysuria with back pain pyelo vs diverticulitis: Pt is afebrile today and has long standing hx of frequent UTIs. Patient had been previously seen and evaluated at both [facility] and [place] [facility]. The pharmacy  was called and informed that patient was given a prescription for levofloxacin 7 250 mg for 7 day course and nitrofurantoin 100 mg for another 7 day course therefore indicating that the patient has completed about a 14 day course of antibiotics and still not having relief of symptoms. Previous urine culture are at this facility did not show any bacteria and records were obtained from [facility] regional that showed CT. Abdomen some concern of fat stranding near the distal part of the descending colon questionable diverticulitis.  -UA, with reflux Urine culture -  renal US scheduled on Monday  -augmentin 875 mg BID for 10 days presumed diverticulitis   Patient was seen and discussed with Dr. Eben Burow

## 2013-05-19 LAB — URINALYSIS, MICROSCOPIC ONLY: Casts: NONE SEEN

## 2013-05-19 LAB — URINALYSIS, ROUTINE W REFLEX MICROSCOPIC
Ketones, ur: NEGATIVE mg/dL
Nitrite: NEGATIVE
Protein, ur: NEGATIVE mg/dL
Urobilinogen, UA: 0.2 mg/dL (ref 0.0–1.0)
pH: 6 (ref 5.0–8.0)

## 2013-05-20 LAB — URINE CULTURE

## 2013-05-21 ENCOUNTER — Other Ambulatory Visit (HOSPITAL_COMMUNITY): Payer: Medicaid Other

## 2013-05-22 NOTE — Progress Notes (Signed)
Case discussed with Dr. Nora Sadek at the time of the visit, immediately after the resident saw the patient.  I reviewed the resident's history and exam and pertinent patient test results.  I agree with the assessment, diagnosis and plan of care documented in the resident's note.     

## 2013-05-23 ENCOUNTER — Ambulatory Visit (HOSPITAL_COMMUNITY): Payer: Medicaid Other

## 2013-06-06 ENCOUNTER — Ambulatory Visit (HOSPITAL_COMMUNITY)
Admission: RE | Admit: 2013-06-06 | Discharge: 2013-06-06 | Disposition: A | Discharge status: Home / Self Care | Payer: Medicaid Other | Source: Ambulatory Visit | Attending: Internal Medicine | Admitting: Internal Medicine

## 2013-06-06 DIAGNOSIS — N390 Urinary tract infection, site not specified: Secondary | ICD-10-CM | POA: Insufficient documentation

## 2013-06-06 DIAGNOSIS — I10 Essential (primary) hypertension: Secondary | ICD-10-CM | POA: Insufficient documentation

## 2013-07-25 ENCOUNTER — Ambulatory Visit (INDEPENDENT_AMBULATORY_CARE_PROVIDER_SITE_OTHER): Payer: Medicaid Other | Admitting: *Deleted

## 2013-07-25 ENCOUNTER — Encounter: Payer: Self-pay | Admitting: *Deleted

## 2013-07-25 DIAGNOSIS — Z111 Encounter for screening for respiratory tuberculosis: Secondary | ICD-10-CM

## 2013-07-25 NOTE — Progress Notes (Addendum)
Patient ID: Ashley Huynh, female   DOB: 1966-05-29, 47 y.o.   MRN: [id number] Pt states she needs TB skin test for [employer].

## 2013-07-27 ENCOUNTER — Ambulatory Visit: Payer: Medicaid Other

## 2013-07-27 LAB — TB SKIN TEST

## 2013-08-03 NOTE — Addendum Note (Signed)
Addended by: Carolan Clines on: 08/03/2013 01:30 PM   Modules accepted: Orders

## 2013-10-17 ENCOUNTER — Telehealth: Payer: Self-pay | Admitting: *Deleted

## 2013-10-17 DIAGNOSIS — K219 Gastro-esophageal reflux disease without esophagitis: Secondary | ICD-10-CM

## 2013-10-17 DIAGNOSIS — I10 Essential (primary) hypertension: Secondary | ICD-10-CM

## 2013-10-17 DIAGNOSIS — B370 Candidal stomatitis: Secondary | ICD-10-CM

## 2013-10-17 DIAGNOSIS — G250 Essential tremor: Secondary | ICD-10-CM

## 2013-10-17 MED ORDER — PROPRANOLOL HCL 40 MG PO TABS: 40.0000 mg | ORAL_TABLET | Freq: Two times a day (BID) | ORAL | Status: DC

## 2013-10-17 NOTE — Telephone Encounter (Signed)
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  She was started on propranolol 40 mg by mouth twice daily.  This medication was renewed.

## 2013-10-17 NOTE — Telephone Encounter (Signed)
Pt was here at the clinic - requesting medication for tremors?  Thanks

## 2013-10-17 NOTE — Addendum Note (Signed)
Addended by: Doneen Poisson D on: 10/17/2013 04:06 PM   Modules accepted: Orders

## 2013-10-19 ENCOUNTER — Ambulatory Visit: Payer: Medicaid Other | Admitting: Internal Medicine

## 2013-10-19 ENCOUNTER — Encounter: Payer: Self-pay | Admitting: Internal Medicine

## 2013-12-05 ENCOUNTER — Ambulatory Visit (INDEPENDENT_AMBULATORY_CARE_PROVIDER_SITE_OTHER): Payer: Medicaid Other | Admitting: Internal Medicine

## 2013-12-05 ENCOUNTER — Encounter: Payer: Self-pay | Admitting: Internal Medicine

## 2013-12-05 VITALS — BP 147/101 | HR 79 | Temp 97.2°F | Ht 62.0 in | Wt 170.1 lb

## 2013-12-05 DIAGNOSIS — I10 Essential (primary) hypertension: Secondary | ICD-10-CM

## 2013-12-05 DIAGNOSIS — G250 Essential tremor: Secondary | ICD-10-CM

## 2013-12-05 DIAGNOSIS — G43909 Migraine, unspecified, not intractable, without status migrainosus: Secondary | ICD-10-CM

## 2013-12-05 DIAGNOSIS — R109 Unspecified abdominal pain: Secondary | ICD-10-CM | POA: Insufficient documentation

## 2013-12-05 DIAGNOSIS — G43711 Chronic migraine without aura, intractable, with status migrainosus: Secondary | ICD-10-CM | POA: Insufficient documentation

## 2013-12-05 DIAGNOSIS — R3129 Other microscopic hematuria: Secondary | ICD-10-CM | POA: Insufficient documentation

## 2013-12-05 MED ORDER — KETOROLAC TROMETHAMINE 30 MG/ML IJ SOLN
30.0000 mg | Freq: Once | INTRAMUSCULAR | Status: DC
  Administered 2013-12-05: 30 mg via INTRAMUSCULAR

## 2013-12-05 MED ORDER — SUMATRIPTAN SUCCINATE 50 MG PO TABS: 50.0000 mg | ORAL_TABLET | ORAL | Status: DC | PRN

## 2013-12-05 NOTE — Assessment & Plan Note (Signed)
oncern for shistosomiais as pt has lived in an endemic area and has not had real formal evaluation. Pt was a never smoker and currently doesn't smoke. No frank signs of infection and no hx of kidney stone.  -urology referral when insurance becomes available -urine micro -urine O&P -worrisome for shistosomiasis given living in endemic area and mixed microcytic hematuria, neurological and GI/GU complainats   

## 2013-12-05 NOTE — Assessment & Plan Note (Signed)
oncern for shistosomiais as pt has lived in an endemic area and has not had real formal evaluation. Pt was a never smoker and currently doesn't smoke. No frank signs of infection and no hx of kidney stone.  -urology referral when insurance becomes available -urine micro -urine O&P -worrisome for shistosomiasis given living in endemic area and mixed microcytic hematuria, neurological and GI/GU complainats

## 2013-12-05 NOTE — Assessment & Plan Note (Signed)
no frank neurological deficits on exam, has been ongoing since September when she states ran out of former meds from [facility]. Has not been taking anything OTC for relief. She seems to associate gross hematuria, some vague abdominal pain, and the HA all together that they happen in this sequence and are intermittent in nature. Pt does have some trapezius muscle tightness that maybe contributing to the HA as well. Pt is already on propranolol for tremor which may also help with prophylaxsis -IM toradol 30mg  in clinic  -imtrex -neurology referral when insurance becomes available  -re-eval in 1 month

## 2013-12-05 NOTE — Assessment & Plan Note (Signed)
pt diastolic 101 likely 2/2 current HA and pain during visit and pt stated didn't take meds this AM.  -will re-eval at next visit to increase or make med changes

## 2013-12-05 NOTE — Progress Notes (Signed)
   Subjective:    Patient ID: Ashley Huynh, female    DOB: 02/02/1966, 47 y.o.   MRN: [id number]  HPI  Ashley Huynh is a 47 yo [place] woman pmh of GERD, HTN, and essential tremor p/w ongoing HA. Pt states that she has ongoing HA that is associated with phonophobia and photophobia since 9/14 when she ran out of meds from [facility] clinic. She has presented to the ED in [facility]( no records in [place]) for this compliant and received the "migrane shot" that provided immediate relief. She also states that with these HA she has noticed some association with vague abdominal pain but no frank nausea/vomiting/diarrhea/constipation, gross hematuria in the past but currently not just sometimes darker or pyuria but no dysuria or increased frequency, and worsening of the HA or head tremor.   She denied any fever/chills, vision loss, rashes, change in bowel habits, weakness in extremities, numbness, neck stiffness, dysarthria or dysphagia.    Review of Systems  Constitutional: Positive for fatigue. Negative for fever, chills, diaphoresis, appetite change and unexpected weight change.  HENT: Negative for congestion, postnasal drip, rhinorrhea, sneezing, sore throat and tinnitus.   Eyes: Positive for photophobia. Negative for visual disturbance.  Respiratory: Negative for cough, chest tightness and shortness of breath.   Cardiovascular: Negative for chest pain.  Gastrointestinal: Positive for abdominal pain. Negative for nausea, vomiting, diarrhea, constipation and blood in stool.  Endocrine: Negative for polyuria.  Genitourinary: Negative for dysuria, urgency and frequency.  Musculoskeletal: Positive for back pain.  Skin: Negative for rash.  Neurological: Positive for tremors and headaches. Negative for dizziness, seizures, syncope, facial asymmetry, speech difficulty, weakness, light-headedness and numbness.       Objective:   Physical Exam Filed Vitals:   12/05/13 1047  BP: 147/101  Pulse: 79   Temp: 97.2 F (36.2 C)   General: resting in bed HEENT: PERRL, EOMI, no scleral icterus Cardiac: RRR, no rubs, murmurs or gallops Pulm: clear to auscultation bilaterally, moving normal volumes of air Abd: soft, nontender, nondistended, BS present Ext: warm and well perfused, no pedal edema Neuro: alert and oriented X3, cranial nerves II-XII grossly intact     Assessment & Plan:  1. Chronic migraines: no frank neurological deficits on exam, has been ongoing since September when she states ran out of former meds from [facility]. Has not been taking anything OTC for relief. She seems to associate gross hematuria, some vague abdominal pain, and the HA all together that they happen in this sequence and are intermittent in nature. Pt does have some trapezius muscle tightness that maybe contributing to the HA as well.  -IM toradol 30mg  in clinic  -imtrex -neurology referral when insurance becomes available  -re-eval in 1 month  2. HTN: pt diastolic 101 likely 2/2 current HA and pain during visit and pt stated didn't take meds this AM.  -will re-eval at next visit to increase or make med changes  3. Microscopic hematuria with abdominal pain: concern for shistosomiais as pt has lived in an endemic area and has not had real formal evaluation. Pt was a never smoker and currently doesn't smoke. No frank signs of infection and no hx of kidney stone.  -urology referral when insurance becomes available -urine micro -urine O&P -worrisome for shistosomiasis given living in endemic area and mixed microcytic hematuria, neurological and GI/GU complainants  Pt was discussed with Dr. Dalphine Handing

## 2013-12-06 LAB — URINALYSIS, MICROSCOPIC ONLY: Casts: NONE SEEN

## 2013-12-06 LAB — URINALYSIS, ROUTINE W REFLEX MICROSCOPIC
Ketones, ur: NEGATIVE mg/dL
Nitrite: NEGATIVE
pH: 7.5 (ref 5.0–8.0)

## 2013-12-06 NOTE — Progress Notes (Signed)
Case discussed with Dr. Sadek at the time of the visit.  We reviewed the resident's history and exam and pertinent patient test results.  I agree with the assessment, diagnosis, and plan of care documented in the resident's note. 

## 2013-12-11 LAB — OVA AND PARASITE EXAMINATION: OP: NONE SEEN

## 2013-12-31 ENCOUNTER — Ambulatory Visit (INDEPENDENT_AMBULATORY_CARE_PROVIDER_SITE_OTHER): Payer: Medicaid Other | Admitting: Internal Medicine

## 2013-12-31 ENCOUNTER — Encounter: Payer: Self-pay | Admitting: Internal Medicine

## 2013-12-31 VITALS — BP 156/100 | HR 84 | Temp 97.6°F | Ht 62.0 in | Wt 176.6 lb

## 2013-12-31 DIAGNOSIS — B373 Candidiasis of vulva and vagina: Secondary | ICD-10-CM

## 2013-12-31 DIAGNOSIS — G43009 Migraine without aura, not intractable, without status migrainosus: Secondary | ICD-10-CM | POA: Insufficient documentation

## 2013-12-31 DIAGNOSIS — B3731 Acute candidiasis of vulva and vagina: Secondary | ICD-10-CM | POA: Insufficient documentation

## 2013-12-31 DIAGNOSIS — G43909 Migraine, unspecified, not intractable, without status migrainosus: Secondary | ICD-10-CM

## 2013-12-31 DIAGNOSIS — Z975 Presence of (intrauterine) contraceptive device: Secondary | ICD-10-CM | POA: Insufficient documentation

## 2013-12-31 DIAGNOSIS — I10 Essential (primary) hypertension: Secondary | ICD-10-CM

## 2013-12-31 DIAGNOSIS — R8281 Pyuria: Secondary | ICD-10-CM | POA: Insufficient documentation

## 2013-12-31 HISTORY — DX: Migraine without aura, not intractable, without status migrainosus: G43.009

## 2013-12-31 HISTORY — DX: Migraine, unspecified, not intractable, without status migrainosus: G43.909

## 2013-12-31 LAB — POCT URINE PREGNANCY: PREG TEST UR: NEGATIVE

## 2013-12-31 MED ORDER — METRONIDAZOLE 500 MG PO TABS: 500.0000 mg | ORAL_TABLET | Freq: Two times a day (BID) | ORAL | Status: AC

## 2013-12-31 MED ORDER — FLUCONAZOLE 150 MG PO TABS: 150.0000 mg | ORAL_TABLET | Freq: Once | ORAL | Status: AC

## 2013-12-31 MED ORDER — SUMATRIPTAN SUCCINATE 50 MG PO TABS: 50.0000 mg | ORAL_TABLET | ORAL | Status: DC | PRN

## 2013-12-31 NOTE — Assessment & Plan Note (Signed)
Pt continues to have some elevated diastolic BP but currently having HA and pain has had good control when not experiencing HA. Will re-evaluate once able to get medication.  BP Readings from Last 3 Encounters:  12/31/13 156/100  12/05/13 147/101  05/18/13 130/90   -recheck consider adding HCTZ or ACEi

## 2013-12-31 NOTE — Assessment & Plan Note (Signed)
Patient had complete resolution of headache when taking Imitrex but ran out of prescription 5 days ago. No focal neurological deficits on exam today. -Refill Imitrex

## 2013-12-31 NOTE — Patient Instructions (Addendum)
In terms of your headache we have refilled your prescription and glad that it removed your headache completely!   In terms of your burning sensation in your urine we ran some tests today and found that you don't have a current infection.   You have a vaginal infection that I have given you medicine for:  -Metronidazole 500mg  twice a day for 7 days -fluconazole 150mg  one pill would take after you finish the antibiotics (metronidazole)  Hope you have a happy new year!

## 2013-12-31 NOTE — Assessment & Plan Note (Signed)
Patient is unable to discern whether or pyuria or just frank vaginal itchiness with white cloudy discharge. Urine dipstick showed only small blood and small protein blood nitrite and leukocyte esterase negative. -UA and urine culture

## 2013-12-31 NOTE — Progress Notes (Signed)
   Subjective:    Patient ID: Ashley Huynh, female    DOB: 1966/02/22, 48 y.o.   MRN: [id number]  HPI Ms. Abbett is a 48 yo [place] womn pmh as listed below presenting for HA and wanting to get pregnant.   Pt describes that she ran out of her "headache medicine" about 5 days ago and was unable to refill the prescription. She does state that when she was taking the pills that she had complete resolution of her headaches. She currently has a headache today during the visit.  Her other concern is that she would like to get pregnant but has an IUD in place. She's had some dyspareunia and white discharge recently that causes some itching. It is hard to discern whether she's having pyuria are not but she states that she may be with some suprapubic tenderness. She's not been taking anything over-the-counter since that time and her husband has not had any rashes or lesions. She denied any fever or chills or back pain and no urinary frequency or dysuria.   Review of Systems  Constitutional: Negative for fever, chills, appetite change and fatigue.  Eyes: Positive for photophobia (with headache). Negative for visual disturbance.  Respiratory: Negative for cough and shortness of breath.   Cardiovascular: Negative for chest pain and leg swelling.  Gastrointestinal: Negative for nausea, vomiting, abdominal pain, diarrhea and blood in stool.  Genitourinary: Positive for vaginal discharge (white,thick, "cottage cheese like"), vaginal pain (itchiness more than frank pain) and dyspareunia. Negative for urgency, frequency, hematuria, flank pain, vaginal bleeding, genital sores and pelvic pain.  Musculoskeletal: Negative for back pain and myalgias.  Neurological: Positive for headaches. Negative for seizures, syncope, speech difficulty, weakness and numbness.  All other systems reviewed and are negative.    Past Medical History  Diagnosis Date  . Hypertension    Social, surgical, family history reviewed  with patient and updated in appropriate chart locations.      Objective:   Physical Exam Filed Vitals:   12/31/13 1041  BP: 156/100  Pulse: 84  Temp: 97.6 F (36.4 C)    General: sitting in chair, NAD HEENT: PERRL, EOMI, no scleral icterus Cardiac: RRR, no rubs, murmurs or gallops Pulm: clear to auscultation bilaterally, moving normal volumes of air Abd: soft, nontender, nondistended, BS present, slight suprapubic tenderness GU: pt deferred exam 2/2 ongoing HA Ext: warm and well perfused, no pedal edema Neuro: alert and oriented X3, cranial nerves II-XII grossly intact     Assessment & Plan:  Please see problem oriented charting  Pt discussed with Dr. Marinda Elk

## 2013-12-31 NOTE — Assessment & Plan Note (Signed)
Patient would like to proceed with conception and therefore would like removal of IUD. -referral to OB/GYN

## 2013-12-31 NOTE — Assessment & Plan Note (Signed)
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 Pt would not like to undergo pelvic examination today but would like to try treatment.  -Fluconazole 150 mg once -Metronidazole 500 mg twice a day for 7 days for presumed BV

## 2013-12-31 NOTE — Assessment & Plan Note (Signed)
Pt had urine dipstick that was non-revealing for infection -nitrates and - leuk esterase -UA, Urine culture -treating for presumed BV and candida see for plan -if doesn't improve will re-evaluate -pt educated on warning symptoms that would warrant more immediate medical evaluation

## 2014-01-01 LAB — URINALYSIS, ROUTINE W REFLEX MICROSCOPIC
BILIRUBIN URINE: NEGATIVE
Glucose, UA: NEGATIVE mg/dL
Ketones, ur: NEGATIVE mg/dL
Leukocytes, UA: NEGATIVE
NITRITE: NEGATIVE
Protein, ur: 100 mg/dL — AB
SPECIFIC GRAVITY, URINE: 1.008 (ref 1.005–1.030)
UROBILINOGEN UA: 0.2 mg/dL (ref 0.0–1.0)
pH: 7 (ref 5.0–8.0)

## 2014-01-01 LAB — URINALYSIS, MICROSCOPIC ONLY
BACTERIA UA: NONE SEEN
CRYSTALS: NONE SEEN
Casts: NONE SEEN

## 2014-01-01 LAB — URINE CULTURE

## 2014-01-01 NOTE — Progress Notes (Signed)
Case discussed with Dr. Sadek soon after the resident saw the patient.  We reviewed the resident's history and exam and pertinent patient test results.  I agree with the assessment, diagnosis, and plan of care documented in the resident's note. 

## 2014-01-08 ENCOUNTER — Encounter: Payer: Self-pay | Admitting: Obstetrics & Gynecology

## 2014-02-08 ENCOUNTER — Ambulatory Visit (INDEPENDENT_AMBULATORY_CARE_PROVIDER_SITE_OTHER): Payer: Medicaid Other | Admitting: Obstetrics & Gynecology

## 2014-02-08 ENCOUNTER — Encounter: Payer: Self-pay | Admitting: Obstetrics & Gynecology

## 2014-02-08 VITALS — BP 151/98 | HR 90 | Ht 63.0 in | Wt 172.9 lb

## 2014-02-08 DIAGNOSIS — Z30432 Encounter for removal of intrauterine contraceptive device: Secondary | ICD-10-CM | POA: Insufficient documentation

## 2014-02-08 NOTE — Patient Instructions (Signed)
Intrauterine Device Information An intrauterine device (IUD) is inserted into your uterus to prevent pregnancy. There are two types of IUDs available:   Copper IUD This type of IUD is wrapped in copper wire and is placed inside the uterus. Copper makes the uterus and fallopian tubes produce a fluid that kills sperm. The copper IUD can stay in place for 10 years.  Hormone IUD This type of IUD contains the hormone progestin (synthetic progesterone). The hormone thickens the cervical mucus and prevents sperm from entering the uterus. It also thins the uterine lining to prevent implantation of a fertilized egg. The hormone can weaken or kill the sperm that get into the uterus. One type of hormone IUD can stay in place for 5 years, and another type can stay in place for 3 years. Your health care provider will make sure you are a good candidate for a contraceptive IUD. Discuss with your health care provider the possible side effects.  ADVANTAGES OF AN INTRAUTERINE DEVICE  IUDs are highly effective, reversible, long acting, and low maintenance.   There are no estrogen-related side effects.   An IUD can be used when breastfeeding.   IUDs are not associated with weight gain.   The copper IUD works immediately after insertion.   The hormone IUD works right away if inserted within 7 days of your period starting. You will need to use a backup method of birth control for 7 days if the hormone IUD is inserted at any other time in your cycle.  The copper IUD does not interfere with your female hormones.   The hormone IUD can make heavy menstrual periods lighter and decrease cramping.   The hormone IUD can be used for 3 or 5 years.   The copper IUD can be used for 10 years. DISADVANTAGES OF AN INTRAUTERINE DEVICE  The hormone IUD can be associated with irregular bleeding patterns.   The copper IUD can make your menstrual flow heavier and more painful.   You may experience cramping and  vaginal bleeding after insertion.  Document Released: 11/09/2004 Document Revised: 08/08/2013 Document Reviewed: 05/27/2013 [employer] Patient Information 2014 [employer].  

## 2014-02-08 NOTE — Progress Notes (Signed)
Pt here today to have IUD removed. She desires to become pregnant. I advised her that we do not do infertility here. Patient voiced understanding and would just like for [place] to remove the IUD today. Consent signed.

## 2014-02-08 NOTE — Progress Notes (Signed)
Patient ID: Ashley Huynh, female   DOB: 09-Dec-1966, 48 y.o.   MRN: [id number] No LMP recorded. Patient is not currently having periods (Reason: IUD). Monthly light menses. Mirena in place 4 years, wants removal to try to conceive. Informed we don't offer infertility services, she might consider asking for referral.   Past Medical History  Diagnosis Date  . Hypertension    Past Surgical History  Procedure Laterality Date  . External ear surgery      right   Current Outpatient Prescriptions on File Prior to Visit  Medication Sig Dispense Refill  . amLODipine (NORVASC) 10 MG tablet Take 1 tablet (10 mg total) by mouth daily.  30 tablet  11  . clotrimazole (MYCELEX) 10 MG troche Take 1 tablet (10 mg total) by mouth 5 (five) times daily.  35 tablet  1  . omeprazole (PRILOSEC) 40 MG capsule Take 1 capsule (40 mg total) by mouth daily.  90 capsule  3  . propranolol (INDERAL) 40 MG tablet Take 1 tablet (40 mg total) by mouth 2 (two) times daily.  60 tablet  11  . SUMAtriptan (IMITREX) 50 MG tablet Take 1 tablet (50 mg total) by mouth every 2 (two) hours as needed for migraine or headache. May repeat in 2 hours if headache persists or recurs.  30 tablet  12  . [DISCONTINUED] lisinopril-hydrochlorothiazide (PRINZIDE) 20-12.5 MG per tablet Take 2 tablets by mouth daily.  60 tablet  11   No current facility-administered medications on file prior to visit.   Allergies  Allergen Reactions  . Other     No pork   NAD. Pleasant Filed Vitals:   02/08/14 0936  BP: 151/98  Pulse: 90   Pelvic: Vaginal and cervix normal and string at the os. Consent given for removal and performed with no problem using ring forcepsl  Imp: IUD removed intact, Mirena Age 48 perimenopause, HTN.   Plan routine f/u, consider referral Woodroe Mode, MD  02/08/2014

## 2014-02-11 ENCOUNTER — Encounter: Payer: Self-pay | Admitting: *Deleted

## 2014-03-01 ENCOUNTER — Telehealth: Payer: Self-pay | Admitting: *Deleted

## 2014-03-01 DIAGNOSIS — Z0000 Encounter for general adult medical examination without abnormal findings: Secondary | ICD-10-CM

## 2014-03-01 NOTE — Telephone Encounter (Signed)
The patient can make an appt to have a pap smear and a referral to mammogram has been placed. I will look for the form.   Thank you.

## 2014-03-01 NOTE — Telephone Encounter (Signed)
Pt came into clinic stating she is not able to et Imitrex at pharmacy. I called pharmacy and they require a PA, they will fax the form to [place] now.  Pt also request order for Mammogram and Pap smear.

## 2014-03-05 ENCOUNTER — Other Ambulatory Visit: Payer: Self-pay | Admitting: Internal Medicine

## 2014-03-05 DIAGNOSIS — Z0000 Encounter for general adult medical examination without abnormal findings: Secondary | ICD-10-CM

## 2014-03-06 ENCOUNTER — Ambulatory Visit (HOSPITAL_COMMUNITY)
Admission: RE | Admit: 2014-03-06 | Discharge: 2014-03-06 | Disposition: A | Discharge status: Home / Self Care | Payer: Medicaid Other | Source: Ambulatory Visit | Attending: Internal Medicine | Admitting: Internal Medicine

## 2014-03-06 ENCOUNTER — Other Ambulatory Visit: Payer: Self-pay | Admitting: Internal Medicine

## 2014-03-06 DIAGNOSIS — Z0000 Encounter for general adult medical examination without abnormal findings: Secondary | ICD-10-CM

## 2014-03-06 DIAGNOSIS — Z1231 Encounter for screening mammogram for malignant neoplasm of breast: Secondary | ICD-10-CM

## 2014-04-12 ENCOUNTER — Ambulatory Visit (INDEPENDENT_AMBULATORY_CARE_PROVIDER_SITE_OTHER): Payer: Medicaid Other | Admitting: Internal Medicine

## 2014-04-12 ENCOUNTER — Other Ambulatory Visit (HOSPITAL_COMMUNITY)
Admission: RE | Admit: 2014-04-12 | Discharge: 2014-04-12 | Disposition: A | Discharge status: Home / Self Care | Payer: Medicaid Other | Source: Ambulatory Visit | Attending: Internal Medicine | Admitting: Internal Medicine

## 2014-04-12 ENCOUNTER — Encounter: Payer: Self-pay | Admitting: Internal Medicine

## 2014-04-12 VITALS — BP 146/93 | HR 81 | Temp 97.5°F | Ht 63.0 in | Wt 177.4 lb

## 2014-04-12 DIAGNOSIS — Z1151 Encounter for screening for human papillomavirus (HPV): Secondary | ICD-10-CM | POA: Insufficient documentation

## 2014-04-12 DIAGNOSIS — Z01419 Encounter for gynecological examination (general) (routine) without abnormal findings: Secondary | ICD-10-CM | POA: Insufficient documentation

## 2014-04-12 DIAGNOSIS — Z0000 Encounter for general adult medical examination without abnormal findings: Secondary | ICD-10-CM

## 2014-04-12 DIAGNOSIS — G43909 Migraine, unspecified, not intractable, without status migrainosus: Secondary | ICD-10-CM

## 2014-04-12 MED ORDER — KETOROLAC TROMETHAMINE 30 MG/ML IJ SOLN
30.0000 mg | Freq: Once | INTRAMUSCULAR | Status: AC
  Administered 2014-04-12: 30 mg via INTRAMUSCULAR

## 2014-04-12 MED ORDER — SUMATRIPTAN SUCCINATE 100 MG PO TABS: 100.0000 mg | ORAL_TABLET | ORAL | Status: DC | PRN

## 2014-04-12 NOTE — Patient Instructions (Addendum)
In terms of your headache I am glad the medicine is working for you. I wrote a new prescription so you can take 2 pills when you need it.   In terms of your pap smear we will send you the results once they are back. Recurrent Migraine Headache A migraine headache is an intense, throbbing pain on one or both sides of your head. Recurrent migraines keep coming back. A migraine can last for 30 minutes to several hours. CAUSES  The exact cause of a migraine headache is not always known. However, a migraine may be caused when nerves in the brain become irritated and release chemicals that cause inflammation. This causes pain. Certain things may also trigger migraines, such as:   Alcohol.  Smoking.  Stress.  Menstruation.  Aged cheeses.  Foods or drinks that contain nitrates, glutamate, aspartame, or tyramine.  Lack of sleep.  Chocolate.  Caffeine.  Hunger.  Physical exertion.  Fatigue.  Medicines used to treat chest pain (nitroglycerine), birth control pills, estrogen, and some blood pressure medicines. SYMPTOMS   Pain on one or both sides of your head.  Pulsating or throbbing pain.  Severe pain that prevents daily activities.  Pain that is aggravated by any physical activity.  Nausea, vomiting, or both.  Dizziness.  Pain with exposure to bright lights, loud noises, or activity.  General sensitivity to bright lights, loud noises, or smells. Before you get a migraine, you may get warning signs that a migraine is coming (aura). An aura may include:  Seeing flashing lights.  Seeing bright spots, halos, or zig-zag lines.  Having tunnel vision or blurred vision.  Having feelings of numbness or tingling.  Having trouble talking.  Having muscle weakness. DIAGNOSIS  A recurrent migraine headache is often diagnosed based on:  Symptoms.  Physical examination.  A CT scan or MRI of your head. These imaging tests cannot diagnose migraines, but can help rule out  other causes of headaches.  TREATMENT  Medicines may be given for pain and nausea. Medicines can also be given to help prevent recurrent migraines. HOME CARE INSTRUCTIONS  Only take over-the-counter or prescription medicines for pain or discomfort as directed by your health care provider. The use of long-term narcotics is not recommended.  Lie down in a dark, quiet room when you have a migraine.  Keep a journal to find out what may trigger your migraine headaches. For example, write down:  What you eat and drink.  How much sleep you get.  Any change to your diet or medicines.  Limit alcohol consumption.  Quit smoking if you smoke.  Get 7 9 hours of sleep, or as recommended by your health care provider.  Limit stress.  Keep lights dim if bright lights bother you and make your migraines worse. SEEK MEDICAL CARE IF:   You do not get relief from the medicines given to you.  You have a recurrence of pain. SEEK IMMEDIATE MEDICAL CARE IF:  Your migraine becomes severe.  You have a fever.  You have a stiff neck.  You have loss of vision.  You have muscular weakness or loss of muscle control.  You start losing your balance or have trouble walking.  You feel faint or pass out.  You have severe symptoms that are different from your first symptoms. MAKE SURE YOU:   Understand these instructions.  Will watch your condition.  Will get help right away if you are not doing well or get worse. Document Released: 08/31/2001 Document Revised:  09/26/2013 Document Reviewed: 08/13/2013 Exit[employer] Patient Information 2014 [place], [place].

## 2014-04-12 NOTE — Progress Notes (Signed)
INTERNAL MEDICINE TEACHING ATTENDING ADDENDUM - Lillianne Eick, MD: I reviewed and discussed at the time of visit with the resident Dr. Sadek, the patient's medical history, physical examination, diagnosis and results of tests and treatment and I agree with the patient's care as documented. 

## 2014-04-12 NOTE — Assessment & Plan Note (Addendum)
Patient had pap smear completed today. Will notify patient by mail if results normal and call if abnormal with referral to OB/GYn. Pt made aware of this decision.

## 2014-04-12 NOTE — Assessment & Plan Note (Signed)
Pt has always had good results when taking her imitrex but didn't fill her prescription and also said she needed to increase the dose to 2 pills for complete resolution instead of one pill.  -imitrex was increased to 100mg   -importance to medication compliance as it always provides good results for patient -IM toradol 30mg  given in clinic pt w/o focal neuro findings today

## 2014-04-12 NOTE — Progress Notes (Signed)
   Subjective:    Patient ID: Ashley Huynh, female    DOB: 21-Nov-1966, 48 y.o.   MRN: [id number]  Headache  Associated symptoms include photophobia. Pertinent negatives include no abdominal pain, coughing, fever, nausea, neck pain, sinus pressure, tinnitus, vomiting or weakness.   Ashley Huynh is a 48 yo [place] women with pmh as listed below presents for pap smear and HA.   In terms of her pap smear she has not had one completed in a very long time since an IUD that was placed and she doesn't remember having any abnormal paps before. IUD was removed 02/2014. She has had return of normal periods that last 3 days and 28 day intervals. She has noticed abdominal/pelvic or vaginal pain or dyspaurenia. She denied any discharge, pyuria, or dysuria.   In terms of her HA it is typical of the patient's migraine and she states that the imitrex usually resolves all of her symptoms but she has been taking 2 pills at once instead of one with complete resolution of symptoms. No CP, weakness, vision changes, dizziness, vomiting, sob, or loc. She will be going to [place] for 3 months and therefore would like enough of the HA medicine before her trip. She has not really been using any other herbals or OTC meds for relief as the imitrex provided complete relief w/o distressing side effects.   Review of Systems  Constitutional: Negative for fever, activity change, appetite change and fatigue.  HENT: Negative for sinus pressure, tinnitus and voice change.   Eyes: Positive for photophobia. Negative for visual disturbance.  Respiratory: Negative for cough, shortness of breath and wheezing.   Cardiovascular: Negative for chest pain, palpitations and leg swelling.  Gastrointestinal: Negative for nausea, vomiting, abdominal pain, diarrhea, constipation and abdominal distention.  Genitourinary: Negative for dysuria, hematuria, vaginal discharge, vaginal pain, menstrual problem, pelvic pain and dyspareunia.    Musculoskeletal: Negative for neck pain and neck stiffness.  Neurological: Positive for headaches. Negative for speech difficulty, weakness and light-headedness.   Past Medical History  Diagnosis Date  . Hypertension    Social, surgical, family history reviewed with patient and updated in appropriate chart locations.     Objective:   Physical Exam Filed Vitals:   04/12/14 1032  BP: 146/93  Pulse: 81  Temp: 97.5 F (36.4 C)   General: sitting in chair, NAD HEENT: PERRL, EOMI, no scleral icterus Cardiac: RRR, no rubs, murmurs or gallops Pulm: clear to auscultation bilaterally, moving normal volumes of air Abd: soft, nontender, nondistended, BS present GU: cervix with some white discharge no polyps/erythema/lesions, no cervical motion tenderness, normal estrogenized vaginal tissue w/o lesions, normal vulva Ext: warm and well perfused, no pedal edema Neuro: alert and oriented X3, cranial nerves II-XII grossly intact     Assessment & Plan:  Please see problem oriented charting  Pt discussed with Dr. Dareen Piano

## 2014-04-18 ENCOUNTER — Encounter: Payer: Self-pay | Admitting: Internal Medicine

## 2014-04-30 ENCOUNTER — Ambulatory Visit (INDEPENDENT_AMBULATORY_CARE_PROVIDER_SITE_OTHER): Payer: Medicaid Other | Admitting: Internal Medicine

## 2014-04-30 ENCOUNTER — Other Ambulatory Visit: Payer: Self-pay | Admitting: *Deleted

## 2014-04-30 ENCOUNTER — Encounter: Payer: Self-pay | Admitting: Internal Medicine

## 2014-04-30 DIAGNOSIS — G43909 Migraine, unspecified, not intractable, without status migrainosus: Secondary | ICD-10-CM

## 2014-04-30 DIAGNOSIS — I10 Essential (primary) hypertension: Secondary | ICD-10-CM

## 2014-04-30 MED ORDER — ACETAMINOPHEN 500 MG PO TABS: 1000.0000 mg | ORAL_TABLET | Freq: Four times a day (QID) | ORAL | Status: AC | PRN

## 2014-04-30 MED ORDER — PROPRANOLOL HCL 40 MG PO TABS: 40.0000 mg | ORAL_TABLET | Freq: Two times a day (BID) | ORAL | Status: DC

## 2014-04-30 MED ORDER — SUMATRIPTAN SUCCINATE 100 MG PO TABS: 100.0000 mg | ORAL_TABLET | ORAL | Status: DC | PRN

## 2014-04-30 NOTE — Patient Instructions (Signed)
Please have the CT scan and the Xray of the back performed as recommended. Start taking the Imitrex, Inderal, Tylenol extra strength as recommended.  We will follow up with the results of the imaging studies and will inform you if anything is abnormal. If your headaches persist or worsens, please give [place] a call or seek medical help.
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
## 2014-05-02 NOTE — Progress Notes (Signed)
Case discussed with Dr. Boggala at time of visit.  We reviewed the resident's history and exam and pertinent patient test results.  I agree with the assessment, diagnosis, and plan of care documented in the resident's note. 

## 2014-05-06 ENCOUNTER — Other Ambulatory Visit: Payer: Self-pay | Admitting: Internal Medicine

## 2014-05-07 ENCOUNTER — Ambulatory Visit (HOSPITAL_COMMUNITY): Admission: RE | Admit: 2014-05-07 | Payer: Medicaid Other | Source: Ambulatory Visit

## 2014-05-07 ENCOUNTER — Ambulatory Visit (HOSPITAL_COMMUNITY): Payer: Medicaid Other

## 2014-05-09 NOTE — Progress Notes (Signed)
Message left on home phone ID recording [phone] Xray and both CT scan sch at [facility] 05/14/14 11:30AM - no restrictions. Left message this AM at 9AM for pt to call clinic - no word from pt. Hilda Blades Binnie Vonderhaar RN 05/09/14 3:20PM

## 2014-05-10 ENCOUNTER — Encounter: Payer: Self-pay | Admitting: Internal Medicine

## 2014-05-14 ENCOUNTER — Ambulatory Visit (HOSPITAL_COMMUNITY): Admission: RE | Admit: 2014-05-14 | Payer: Medicaid Other | Source: Ambulatory Visit

## 2014-05-14 ENCOUNTER — Other Ambulatory Visit: Payer: Self-pay | Admitting: *Deleted

## 2014-05-14 ENCOUNTER — Ambulatory Visit (HOSPITAL_COMMUNITY): Payer: Medicaid Other

## 2014-05-14 DIAGNOSIS — I10 Essential (primary) hypertension: Secondary | ICD-10-CM

## 2014-05-14 MED ORDER — AMLODIPINE BESYLATE 10 MG PO TABS: 10.0000 mg | ORAL_TABLET | Freq: Every day | ORAL | Status: DC

## 2014-05-14 NOTE — Telephone Encounter (Signed)
Pt is out of meds, can you refill ? 

## 2014-10-21 ENCOUNTER — Encounter: Payer: Self-pay | Admitting: Internal Medicine

## 2014-12-06 ENCOUNTER — Ambulatory Visit (INDEPENDENT_AMBULATORY_CARE_PROVIDER_SITE_OTHER): Payer: Medicaid Other | Admitting: Internal Medicine

## 2014-12-06 ENCOUNTER — Encounter: Payer: Self-pay | Admitting: Internal Medicine

## 2014-12-06 VITALS — BP 167/105 | HR 96 | Temp 98.6°F | Ht 61.9 in | Wt 172.5 lb

## 2014-12-06 DIAGNOSIS — L739 Follicular disorder, unspecified: Secondary | ICD-10-CM

## 2014-12-06 DIAGNOSIS — I10 Essential (primary) hypertension: Secondary | ICD-10-CM

## 2014-12-06 MED ORDER — HYDROCHLOROTHIAZIDE 12.5 MG PO TABS: 25.0000 mg | ORAL_TABLET | Freq: Every day | ORAL | Status: DC

## 2014-12-06 MED ORDER — SULFAMETHOXAZOLE-TRIMETHOPRIM 400-80 MG PO TABS: 1.0000 | ORAL_TABLET | Freq: Two times a day (BID) | ORAL | Status: AC

## 2014-12-06 NOTE — Assessment & Plan Note (Signed)
BP Readings from Last 3 Encounters:  12/06/14 167/105  04/30/14 161/105  04/12/14 146/93    Lab Results  Component Value Date   NA 137 05/18/2013   K 3.0* 05/18/2013   CREATININE 0.54 05/18/2013    Assessment: Blood pressure control:  poor  Progress toward BP goal:    Comments: Previous see the patient had been noncompliant with her medications but per her report she has been taking her daily amlodipine, upon recheck today in clinic the patient continued to have elevated readings.  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

## 2014-12-06 NOTE — Assessment & Plan Note (Signed)
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 The "rash" he seems to always resolve with penicillin derived antibiotics. Given the recurrence there is concern that this may be MRSA. The patient does not have any systemic symptoms. 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 The patient has never seen a dermatologist. -Bactrim for 14 days -We'll follow-up in 2 weeks after treatment if no resolution will refer to ID or dermatology for further recommendations

## 2014-12-06 NOTE — Progress Notes (Signed)
Subjective:   Patient ID: Paislie Tessler female   DOB: 30-Aug-1966 48 y.o.   MRN: [id number]  HPI: Ms.Minola Cannell is a 48 y.o. woman pmh as listed below presents for rash on her face.   The patient has recently returned back from a visit to [place]. She states that she had a bilateral facial rash that started about 8 months ago when she first went to [place] and picked up some over-the-counter Augmentin at that time and to the prescription for 2 weeks she's had an ear infection totally cleared and she described it as like really bad pimples. After returning about 2 months ago she says that this pimple rash has returned. She is a history of this in the past about 4 years ago that she remembers taking antibiotics for an extensive time which totally cleared the issue. She describes these rash as pleuritic in nature, painful lesions that sometimes drain pus and consisting only on her bilateral cheeks. She denies using any razors or honey creams to remove hair on her face or new lotions or creams.  She does state that she has been taking her antihypertensives and other medications. She does not have recurrent daily headaches anymore.   Past Medical History  Diagnosis Date  . Hypertension    Current Outpatient Prescriptions  Medication Sig Dispense Refill  . acetaminophen (TYLENOL) 500 MG tablet Take 2 tablets (1,000 mg total) by mouth every 6 (six) hours as needed. 100 tablet 0  . amLODipine (NORVASC) 10 MG tablet Take 1 tablet (10 mg total) by mouth daily. 30 tablet 11  . hydrochlorothiazide (HYDRODIURIL) 12.5 MG tablet Take 2 tablets (25 mg total) by mouth daily. 30 tablet 2  . omeprazole (PRILOSEC) 40 MG capsule Take 1 capsule (40 mg total) by mouth daily. 90 capsule 3  . propranolol (INDERAL) 40 MG tablet Take 1 tablet (40 mg total) by mouth 2 (two) times daily. 60 tablet 11  . sulfamethoxazole-trimethoprim (BACTRIM) 400-80 MG per tablet Take 1 tablet by mouth 2 (two) times daily. 28 tablet 0    . SUMAtriptan (IMITREX) 100 MG tablet Take 1 tablet (100 mg total) by mouth every 2 (two) hours as needed for migraine or headache. May repeat in 2 hours if headache persists or recurs. Maximum of 6 doses per a day. 30 tablet 12  . [DISCONTINUED] lisinopril-hydrochlorothiazide (PRINZIDE) 20-12.5 MG per tablet Take 2 tablets by mouth daily. 60 tablet 11   No current facility-administered medications for this visit.   Family History  Problem Relation Age of Onset  . Hypertension Mother   . Hypertension Father    History   Social History  . Marital Status: Married    Spouse Name: N/A    Number of Children: N/A  . Years of Education: N/A   Social History Main Topics  . Smoking status: Never Smoker   . Smokeless tobacco: None  . Alcohol Use: No  . Drug Use: No  . Sexual Activity: None   Other Topics Concern  . None   Social History Narrative   Review of Systems: Pertinent items are noted in HPI. Objective:  Physical Exam: Filed Vitals:   12/06/14 1442  BP: 167/105  Pulse: 96  Temp: 98.6 F (37 C)  TempSrc: Oral  Height: 5' 1.9" (1.572 m)  Weight: 172 lb 8 oz (78.245 kg)  SpO2: 100%   General: sitting in chair, NAD  HEENT: PERRL, no scleral icterus Cardiac: RRR, no rubs, murmurs or gallops Pulm: clear to auscultation  bilaterally, moving normal volumes of air Abd: soft, nontender, nondistended, BS present Ext: warm and well perfused, no pedal edema, follicular papulopustular lesions on the bilateral cheeks sparing the nose, no satellite lesions periorbital or perioral, Neuro: alert and oriented X3, cranial nerves II-XII grossly intact  Assessment & Plan:  Please see problem oriented charting  Pt discussed with Dr. Beryle Beams

## 2014-12-06 NOTE — Progress Notes (Signed)
Medicine attending: Medical history, presenting problems, physical findings, and medications, reviewed with Dr Nora Sadek and I concur with her evaluation and management plan. 

## 2014-12-06 NOTE — Patient Instructions (Signed)
General Instructions:   Please bring your medicines with you each time you come to clinic.  Medicines may include prescription medications, over-the-counter medications, herbal remedies, eye drops, vitamins, or other pills.   Progress Toward Treatment Goals:  Treatment Goal 01/10/2013  Blood pressure improved    Self Care Goals & Plans:  Self Care Goal 12/06/2014  Manage my medications take my medicines as prescribed; bring my medications to every visit; refill my medications on time  Monitor my health -  Eat healthy foods drink diet soda or water instead of juice or soda; eat more vegetables; eat foods that are low in salt; eat baked foods instead of fried foods; eat fruit for snacks and desserts  Be physically active -    No flowsheet data found.   Care Management & Community Referrals:  No flowsheet data found.

## 2014-12-27 ENCOUNTER — Encounter: Payer: Self-pay | Admitting: Internal Medicine

## 2014-12-27 ENCOUNTER — Ambulatory Visit (INDEPENDENT_AMBULATORY_CARE_PROVIDER_SITE_OTHER): Payer: Medicaid Other | Admitting: Internal Medicine

## 2014-12-27 VITALS — BP 136/92 | HR 80 | Temp 97.9°F | Ht 63.0 in | Wt 172.4 lb

## 2014-12-27 DIAGNOSIS — R21 Rash and other nonspecific skin eruption: Secondary | ICD-10-CM

## 2014-12-27 MED ORDER — ADAPALENE 0.1 % EX CREA: TOPICAL_CREAM | Freq: Every day | CUTANEOUS | Status: DC

## 2014-12-27 NOTE — Patient Instructions (Signed)
General Instructions:   Please bring your medicines with you each time you come to clinic.  Medicines may include prescription medications, over-the-counter medications, herbal remedies, eye drops, vitamins, or other pills.  For your rash at this time we are not fully certain of its cause. But we will try to give you a cream to help if it is acne and we recommend using a soap/cream with benzoyl peroxide 2.5-5% twice every day. If your face gets worse or dries out too much you can stop the medicine.   Progress Toward Treatment Goals:  Treatment Goal 01/10/2013  Blood pressure improved    Self Care Goals & Plans:  Self Care Goal 12/27/2014  Manage my medications take my medicines as prescribed; bring my medications to every visit; refill my medications on time  Monitor my health -  Eat healthy foods drink diet soda or water instead of juice or soda; eat more vegetables; eat foods that are low in salt; eat baked foods instead of fried foods; eat fruit for snacks and desserts  Be physically active -    No flowsheet data found.   Care Management & Community Referrals:  No flowsheet data found.

## 2014-12-27 NOTE — Progress Notes (Signed)
Subjective:   Patient ID: Ashley Huynh female   DOB: 10-Dec-1966 49 y.o.   MRN: [id number]  HPI: Ashley Huynh is a 49 y.o. [name] past medical history as listed below who presents for follow-up on her facial rash.  The patient was recently seen on 12/06/14 and was thought to have folliculitis and was treated with Bactrim for 14 days. Since that time the patient has not had any improvement. She has had several large "boils" on her face that have drained yellow pus and then healed to a dark hyperpigmented area. She has had no relief or regression after the Antibiotics. She has also been trying some OTC acne soaps with no relief. She denies any joint pain, other rashes on her body, no neck rash, no alopecia, nausea/vomiting/diarrhea, abdominal pain, jaundice, hematuria, or headache.   She states this is similar to an event she had about 2 years ago and was treated with 6 months of a medication but did come back in a similar fashion.   Past Medical History  Diagnosis Date  . Hypertension    Current Outpatient Prescriptions  Medication Sig Dispense Refill  . acetaminophen (TYLENOL) 500 MG tablet Take 2 tablets (1,000 mg total) by mouth every 6 (six) hours as needed. 100 tablet 0  . amLODipine (NORVASC) 10 MG tablet Take 1 tablet (10 mg total) by mouth daily. 30 tablet 11  . hydrochlorothiazide (HYDRODIURIL) 12.5 MG tablet Take 2 tablets (25 mg total) by mouth daily. 30 tablet 2  . omeprazole (PRILOSEC) 40 MG capsule Take 1 capsule (40 mg total) by mouth daily. 90 capsule 3  . propranolol (INDERAL) 40 MG tablet Take 1 tablet (40 mg total) by mouth 2 (two) times daily. 60 tablet 11  . SUMAtriptan (IMITREX) 100 MG tablet Take 1 tablet (100 mg total) by mouth every 2 (two) hours as needed for migraine or headache. May repeat in 2 hours if headache persists or recurs. Maximum of 6 doses per a day. 30 tablet 12  . [DISCONTINUED] lisinopril-hydrochlorothiazide (PRINZIDE) 20-12.5 MG per  tablet Take 2 tablets by mouth daily. 60 tablet 11   No current facility-administered medications for this visit.   Family History  Problem Relation Age of Onset  . Hypertension Mother   . Hypertension Father    History   Social History  . Marital Status: Married    Spouse Name: N/A    Number of Children: N/A  . Years of Education: N/A   Social History Main Topics  . Smoking status: Never Smoker   . Smokeless tobacco: None  . Alcohol Use: No  . Drug Use: No  . Sexual Activity: None   Other Topics Concern  . None   Social History Narrative   Review of Systems: Pertinent items are noted in HPI. Objective:  Physical Exam: Filed Vitals:   12/27/14 1528  BP: 136/92  Pulse: 80  Temp: 97.9 F (36.6 C)  TempSrc: Oral  Height: 5\' 3"  (1.6 m)  Weight: 172 lb 6.4 oz (78.2 kg)  SpO2: 100%   General: sitting in chair, NAD HEENT: PERRL, EOMI, no scleral icterus Cardiac: RRR, no rubs, murmurs or gallops Pulm: clear to auscultation bilaterally, moving normal volumes of air Abd: soft, nontender, nondistended, BS present Ext: warm and well perfused, no pedal edema Neuro: alert and oriented X3, cranial nerves II-XII grossly intact Skin: some comedones, multiple different stages of healing abscess/pustules, some clear filled vesicles on non-erythematous bases, large 1cm circumferential draining yellow pus carbuncle on  left upper temple  Assessment & Plan:  Please see problem oriented charting  Pt discussed and seen with Dr. Lynnae January

## 2014-12-27 NOTE — Assessment & Plan Note (Addendum)
This is of unknown etiology. It was previously thought to be a folliculitis and the patient has undergone at least 3 rounds of different antibiotics with no relief. Today's exam shows a wide range of different stages of healing comedones and carbuncles but also some small pinpoint clear filled vesicles across the cheeks and for head and chin but sparing the nose and eyes. The patient continues to not have any other systemic signs or symptoms. Is unknown given her return to [place] whether there is parasitic, underlying autoimmune/inflammatory condition, or acne. The patient's rash does not seem to be specific to any of these possible etiologies. There doesn't appear to be characteristic of fungal etiology as well. It is also concerning and odd that the rash continues to return as pt describes having similar episodes about 2 years ago that presented in the same fashion.  -HIV testing -Topical benzyl peroxide, topical retinoid, will forego more oral antibiotics if this is acne the patient was told that if her rash worsens to discontinue this treatment -Referral to dermatology for possible skin biopsy. -Topical steriods have not been tried but maybe an alternative if this is thought to be autoimmune.

## 2014-12-28 LAB — HIV ANTIBODY (ROUTINE TESTING W REFLEX): HIV: NONREACTIVE

## 2014-12-30 NOTE — Progress Notes (Signed)
Internal Medicine Clinic Attending  I saw and evaluated the patient.  I personally confirmed the key portions of the history and exam documented by Dr. Sadek and I reviewed pertinent patient test results.  The assessment, diagnosis, and plan were formulated together and I agree with the documentation in the resident's note. 

## 2015-03-03 ENCOUNTER — Other Ambulatory Visit: Payer: Self-pay | Admitting: *Deleted

## 2015-03-03 DIAGNOSIS — I10 Essential (primary) hypertension: Secondary | ICD-10-CM

## 2015-03-04 MED ORDER — AMLODIPINE BESYLATE 10 MG PO TABS: 10.0000 mg | ORAL_TABLET | Freq: Every day | ORAL | Status: DC

## 2015-03-04 MED ORDER — HYDROCHLOROTHIAZIDE 12.5 MG PO TABS: 25.0000 mg | ORAL_TABLET | Freq: Every day | ORAL | Status: DC

## 2015-04-30 ENCOUNTER — Encounter: Payer: Self-pay | Admitting: *Deleted

## 2015-07-07 ENCOUNTER — Other Ambulatory Visit: Payer: Self-pay | Admitting: Internal Medicine

## 2015-07-07 ENCOUNTER — Telehealth: Payer: Self-pay | Admitting: *Deleted

## 2015-07-07 DIAGNOSIS — I10 Essential (primary) hypertension: Secondary | ICD-10-CM

## 2015-07-07 MED ORDER — AMLODIPINE BESYLATE 5 MG PO TABS: 5.0000 mg | ORAL_TABLET | Freq: Two times a day (BID) | ORAL | Status: DC

## 2015-07-07 NOTE — Telephone Encounter (Signed)
Yes changed the norvasc to 5 mg BID and sent it to her pharmacy with refills.   Thanks!  Dr. Naaman Plummer

## 2015-07-07 NOTE — Telephone Encounter (Signed)
Left message on ID recording Rx is at pharmacy.

## 2015-07-07 NOTE — Telephone Encounter (Signed)
Pt stopped by office - needs new Rx for amlodipine 5mg  taking one tablet twice a day #60 with refills .  [facility] on [address]. Reason she can get Rx for $8.00 vs $40.00. Hilda Blades Jenny Lai RN 07/07/15 9AM

## 2016-02-09 ENCOUNTER — Encounter: Payer: Self-pay | Admitting: Internal Medicine

## 2016-02-09 ENCOUNTER — Ambulatory Visit (INDEPENDENT_AMBULATORY_CARE_PROVIDER_SITE_OTHER): Payer: Self-pay | Admitting: Internal Medicine

## 2016-02-09 VITALS — BP 177/104 | HR 78 | Temp 97.4°F | Wt 177.0 lb

## 2016-02-09 DIAGNOSIS — K219 Gastro-esophageal reflux disease without esophagitis: Secondary | ICD-10-CM

## 2016-02-09 DIAGNOSIS — Z0000 Encounter for general adult medical examination without abnormal findings: Secondary | ICD-10-CM

## 2016-02-09 DIAGNOSIS — I10 Essential (primary) hypertension: Secondary | ICD-10-CM

## 2016-02-09 DIAGNOSIS — Z23 Encounter for immunization: Secondary | ICD-10-CM

## 2016-02-09 MED ORDER — OMEPRAZOLE 20 MG PO CPDR: 20.0000 mg | DELAYED_RELEASE_CAPSULE | Freq: Every day | ORAL | Status: DC

## 2016-02-09 MED ORDER — HYDROCHLOROTHIAZIDE 25 MG PO TABS: 25.0000 mg | ORAL_TABLET | Freq: Every day | ORAL | Status: DC

## 2016-02-09 MED ORDER — AMLODIPINE BESYLATE 5 MG PO TABS: 10.0000 mg | ORAL_TABLET | Freq: Every day | ORAL | Status: DC

## 2016-02-09 NOTE — Patient Instructions (Addendum)
-  Start taking HCTZ 25 mg daily for high blood pressure -Take norvasc 10 mg (take two 5 mg pills at once) daily for high blood pressure  -Take prilosec 20 mg daily for acid reflux  -Take OTC cetirizine 10 mg daily if needed for allergies -Will check your bloodwork today and call you with the results -Will give you tetanus and flu shot today -Please come back in 1 week to recheck your blood pressure -Very nice meeting you!   General Instructions:   Please bring your medicines with you each time you come to clinic.  Medicines may include prescription medications, over-the-counter medications, herbal remedies, eye drops, vitamins, or other pills.   Progress Toward Treatment Goals:  Treatment Goal 01/10/2013  Blood pressure improved    Self Care Goals & Plans:  Self Care Goal 12/27/2014  Manage my medications take my medicines as prescribed; bring my medications to every visit; refill my medications on time  Eat healthy foods drink diet soda or water instead of juice or soda; eat more vegetables; eat foods that are low in salt; eat baked foods instead of fried foods; eat fruit for snacks and desserts  Be physically active -    No flowsheet data found.   Care Management & Community Referrals:  No flowsheet data found.

## 2016-02-09 NOTE — Assessment & Plan Note (Signed)
Assessment: Pt with history of GERD not currently on medical therapy who presents with sore throat and acid reflux symptoms of 2-week duration most likely due to uncontrolled GERD.   Plan:  -Prescribe prilosec 20 mg daily -Obtain CBC to assess for anemia  -Monitor for alarm symptoms warranting EGD

## 2016-02-09 NOTE — Assessment & Plan Note (Addendum)
Assessment: Pt with moderately well-controlled hypertension compliant with one-class (CCB) anti-hypertensive therapy who presents with blood pressure of 177/104.   Plan:  -BP 177/104 not at goal <140/90 -Prescribe HCTZ 25 mg daily  -Continue amlodipine 10 mg daily  -Obtain CMP -Pt to return in 1 week for BP recheck

## 2016-02-09 NOTE — Assessment & Plan Note (Signed)
Pt received annual influenza vaccination as well as tdap vaccination today on 02/09/16

## 2016-02-09 NOTE — Progress Notes (Signed)
Patient ID: Ashley Huynh, female   DOB: 1966/04/17, 50 y.o.   MRN: [id number]    Subjective:   Patient ID: Ashley Huynh female   DOB: 1966-02-26 50 y.o.   MRN: [id number]  HPI: [name] is a 50 y.o. very pleasant woman with past medical history of hypertension, migraine headaches, essential head tremor, and GERD who presents for follow-up of hypertension and chief complaint of sore throat.   She is currently taking norvasc 5 mg twice a day. She is no longer taking HCTZ or propanol for essential tremor. She has chronic migraine headaches but denies chest pain, blurry vision, lightheadedness, or LE edema.     She reports having a sore throat for the past 2 weeks with acid reflux symptoms. She is no longer on prilosec. She also reports having possible allergies and has nasal congestion, rhinorrhea, and itchy eyes. She is not currently on an anti-histamine or nasal spray.   She would like to have tdap and influenza vaccinations today.     Past Medical History  Diagnosis Date  . Hypertension    Current Outpatient Prescriptions  Medication Sig Dispense Refill  . adapalene (DIFFERIN) 0.1 % cream Apply topically at bedtime. 45 g 0  . amLODipine (NORVASC) 5 MG tablet Take 1 tablet (5 mg total) by mouth 2 (two) times daily. 60 tablet 5  . hydrochlorothiazide (HYDRODIURIL) 12.5 MG tablet Take 2 tablets (25 mg total) by mouth daily. 30 tablet 2  . omeprazole (PRILOSEC) 40 MG capsule Take 1 capsule (40 mg total) by mouth daily. 90 capsule 3  . propranolol (INDERAL) 40 MG tablet Take 1 tablet (40 mg total) by mouth 2 (two) times daily. 60 tablet 11  . SUMAtriptan (IMITREX) 100 MG tablet Take 1 tablet (100 mg total) by mouth every 2 (two) hours as needed for migraine or headache. May repeat in 2 hours if headache persists or recurs. Maximum of 6 doses per a day. 30 tablet 12  . [DISCONTINUED] lisinopril-hydrochlorothiazide (PRINZIDE) 20-12.5 MG per tablet Take 2 tablets by mouth daily. 60  tablet 11   No current facility-administered medications for this visit.   Family History  Problem Relation Age of Onset  . Hypertension Mother   . Hypertension Father    Social History   Social History  . Marital Status: Married    Spouse Name: N/A  . Number of Children: N/A  . Years of Education: N/A   Social History Main Topics  . Smoking status: Never Smoker   . Smokeless tobacco: None  . Alcohol Use: No  . Drug Use: No  . Sexual Activity: Not Asked   Other Topics Concern  . None   Social History Narrative   Review of Systems: Review of Systems  Constitutional: Negative for fever and chills.  HENT: Positive for congestion and sore throat (2 weeks).   Eyes: Negative for blurred vision.  Respiratory: Negative for cough, shortness of breath and wheezing.   Cardiovascular: Negative for chest pain and leg swelling.  Gastrointestinal: Positive for heartburn (not on medical therapy). Negative for nausea, vomiting, abdominal pain, diarrhea, constipation and blood in stool.  Genitourinary: Negative for dysuria, urgency, frequency and hematuria.  Neurological: Positive for headaches (chronic migraine). Negative for dizziness.    Objective:  Physical Exam: Filed Vitals:   02/09/16 1106  BP: 177/104  Pulse: 78  Temp: 97.4 F (36.3 C)  TempSrc: Oral  Weight: 177 lb (80.287 kg)  SpO2: 98%    Physical Exam  Constitutional: She is  oriented to person, place, and time. She appears well-developed and well-nourished. No distress.  HENT:  Head: Normocephalic and atraumatic.  Right Ear: External ear normal.  Left Ear: External ear normal.  Nose: Nose normal.  Mouth/Throat: Oropharynx is clear and moist. No oropharyngeal exudate.  Eyes: Conjunctivae and EOM are normal. Pupils are equal, round, and reactive to light. Right eye exhibits no discharge. Left eye exhibits no discharge. No scleral icterus.  Neck: Normal range of motion. Neck supple.  Cardiovascular: Normal rate,  regular rhythm and normal heart sounds.   Pulmonary/Chest: Breath sounds normal. No respiratory distress. She has no wheezes. She has no rales.  Abdominal: Soft. Bowel sounds are normal. She exhibits no distension. There is no tenderness. There is no rebound and no guarding.  Musculoskeletal: Normal range of motion. She exhibits no edema or tenderness.  Neurological: She is alert and oriented to person, place, and time.  Skin: Skin is warm and dry. No rash noted. She is not diaphoretic. No erythema. No pallor.  Psychiatric: She has a normal mood and affect. Her behavior is normal. Judgment and thought content normal.    Assessment & Plan:   Please see problem list for problem-based assessment and plan

## 2016-02-10 LAB — CMP14 + ANION GAP
A/G RATIO: 1.3 (ref 1.1–2.5)
ALBUMIN: 4.1 g/dL (ref 3.5–5.5)
ALT: 28 IU/L (ref 0–32)
ANION GAP: 18 mmol/L (ref 10.0–18.0)
AST: 23 IU/L (ref 0–40)
Alkaline Phosphatase: 65 IU/L (ref 39–117)
BUN / CREAT RATIO: 19 (ref 9–23)
BUN: 12 mg/dL (ref 6–24)
Bilirubin Total: <0.2 mg/dL (ref 0.0–1.2)
CALCIUM: 9.6 mg/dL (ref 8.7–10.2)
CO2: 26 mmol/L (ref 18–29)
CREATININE: 0.63 mg/dL (ref 0.57–1.00)
Chloride: 97 mmol/L (ref 96–106)
GFR, EST AFRICAN AMERICAN: 123 mL/min/{1.73_m2} (ref >59)
GFR, EST NON AFRICAN AMERICAN: 106 mL/min/{1.73_m2} (ref >59)
GLOBULIN, TOTAL: 3.1 g/dL (ref 1.5–4.5)
Glucose: 98 mg/dL (ref 65–99)
POTASSIUM: 3.7 mmol/L (ref 3.5–5.2)
SODIUM: 141 mmol/L (ref 134–144)
Total Protein: 7.2 g/dL (ref 6.0–8.5)

## 2016-02-10 LAB — CBC WITH DIFFERENTIAL/PLATELET
BASOS: 0 %
Basophils Absolute: 0 10*3/uL (ref 0.0–0.2)
EOS (ABSOLUTE): 0.1 10*3/uL (ref 0.0–0.4)
EOS: 1 %
HEMATOCRIT: 42.5 % (ref 34.0–46.6)
Hemoglobin: 14 g/dL (ref 11.1–15.9)
Immature Grans (Abs): 0 10*3/uL (ref 0.0–0.1)
Immature Granulocytes: 0 %
LYMPHS ABS: 2.5 10*3/uL (ref 0.7–3.1)
Lymphs: 38 %
MCH: 27.8 pg (ref 26.6–33.0)
MCHC: 32.9 g/dL (ref 31.5–35.7)
MCV: 84 fL (ref 79–97)
MONOS ABS: 0.6 10*3/uL (ref 0.1–0.9)
Monocytes: 9 %
NEUTROS ABS: 3.3 10*3/uL (ref 1.4–7.0)
Neutrophils: 52 %
Platelets: 341 10*3/uL (ref 150–379)
RBC: 5.04 x10E6/uL (ref 3.77–5.28)
RDW: 14.8 % (ref 12.3–15.4)
WBC: 6.4 10*3/uL (ref 3.4–10.8)

## 2016-02-10 NOTE — Progress Notes (Signed)
Internal Medicine Clinic Attending  Case discussed with Dr. Rabbani soon after the resident saw the patient.  We reviewed the resident's history and exam and pertinent patient test results.  I agree with the assessment, diagnosis, and plan of care documented in the resident's note.  

## 2016-06-09 ENCOUNTER — Encounter: Payer: Self-pay | Admitting: *Deleted

## 2016-07-18 ENCOUNTER — Other Ambulatory Visit: Payer: Self-pay | Admitting: Internal Medicine

## 2016-07-18 DIAGNOSIS — I10 Essential (primary) hypertension: Secondary | ICD-10-CM

## 2016-07-19 NOTE — Telephone Encounter (Signed)
Last appointment 02/09/16

## 2016-07-30 ENCOUNTER — Other Ambulatory Visit: Payer: Self-pay | Admitting: *Deleted

## 2016-07-30 DIAGNOSIS — I10 Essential (primary) hypertension: Secondary | ICD-10-CM

## 2016-07-30 MED ORDER — HYDROCHLOROTHIAZIDE 25 MG PO TABS: 25.0000 mg | ORAL_TABLET | Freq: Every day | ORAL | 0 refills | Status: DC

## 2016-07-30 NOTE — Telephone Encounter (Signed)
Patient's daughter in waiting room requesting refill of HTN medications will ask to schedule an appointment.

## 2016-09-22 ENCOUNTER — Telehealth: Payer: Self-pay

## 2016-09-22 NOTE — Telephone Encounter (Signed)
APT. REMINDER CALL, LMTCB °

## 2016-09-23 ENCOUNTER — Encounter: Payer: Medicaid Other | Admitting: Internal Medicine

## 2016-10-28 ENCOUNTER — Telehealth: Payer: Self-pay | Admitting: Internal Medicine

## 2016-10-28 NOTE — Telephone Encounter (Signed)
APT. REMINDER CALL, LMTCB °

## 2016-10-29 ENCOUNTER — Ambulatory Visit: Payer: Medicaid Other

## 2016-11-05 ENCOUNTER — Ambulatory Visit: Payer: Medicaid Other

## 2017-02-01 ENCOUNTER — Encounter (INDEPENDENT_AMBULATORY_CARE_PROVIDER_SITE_OTHER): Payer: Self-pay

## 2017-02-01 ENCOUNTER — Encounter: Payer: Self-pay | Admitting: Pulmonary Disease

## 2017-02-01 ENCOUNTER — Ambulatory Visit (INDEPENDENT_AMBULATORY_CARE_PROVIDER_SITE_OTHER): Payer: Self-pay | Admitting: Pulmonary Disease

## 2017-02-01 VITALS — BP 175/107 | HR 79 | Temp 97.8°F | Ht 63.0 in | Wt 175.4 lb

## 2017-02-01 DIAGNOSIS — E876 Hypokalemia: Secondary | ICD-10-CM

## 2017-02-01 DIAGNOSIS — Z79899 Other long term (current) drug therapy: Secondary | ICD-10-CM

## 2017-02-01 DIAGNOSIS — Z9114 Patient's other noncompliance with medication regimen: Secondary | ICD-10-CM

## 2017-02-01 DIAGNOSIS — I10 Essential (primary) hypertension: Secondary | ICD-10-CM

## 2017-02-01 MED ORDER — HYDROCHLOROTHIAZIDE 25 MG PO TABS: 25.0000 mg | ORAL_TABLET | Freq: Every day | ORAL | 0 refills | Status: DC

## 2017-02-01 MED ORDER — AMLODIPINE BESYLATE 5 MG PO TABS: 5.0000 mg | ORAL_TABLET | Freq: Two times a day (BID) | ORAL | 0 refills | Status: DC

## 2017-02-01 NOTE — Patient Instructions (Signed)
Take amlodipine twice a day Take hydrochlorothiazide once a day Follow up in 2 weeks

## 2017-02-01 NOTE — Progress Notes (Signed)
   CC: Hypertension follow up  HPI:  [name] is a 51 y.o. woman with hypertension here for hypertension follow up.  She has not had her blood pressure medications in one week. She does not check her blood pressure at home.    Past Medical History:  Diagnosis Date  . Hypertension     Review of Systems:   +headache No chest pain No dyspnea   Physical Exam:  Vitals:   02/01/17 1114  BP: (!) 175/107  Pulse: 79  Temp: 97.8 F (36.6 C)  TempSrc: Oral  SpO2: 99%  Weight: 175 lb 6.4 oz (79.6 kg)  Height: 5\' 3"  (1.6 m)   General Apperance: NAD HEENT: Normocephalic, atraumatic, anicteric sclera Neck: Supple, trachea midline Lungs: Clear to auscultation bilaterally. No wheezes, rhonchi or rales. Breathing comfortably Heart: Regular rate and rhythm, no murmur/rub/gallop Abdomen: Soft, nontender, nondistended, no rebound/guarding Extremities: Warm and well perfused, no edema Skin: No rashes or lesions Neurologic: Alert and interactive. No gross deficits.  Assessment & Plan:   See Encounters Tab for problem based charting.  Patient discussed with Dr. Angelia Mould

## 2017-02-01 NOTE — Progress Notes (Signed)
Internal Medicine Clinic Attending  Case discussed with Dr. Krall at the time of the visit.  We reviewed the resident's history and exam and pertinent patient test results.  I agree with the assessment, diagnosis, and plan of care documented in the resident's note.  

## 2017-02-01 NOTE — Assessment & Plan Note (Signed)
Assessment: medication noncompliance. BP today 175/107.   Plan: Restart amlodipine 5mg  BID Restart HCTZ 25mg  daily BMP today Consider ARB at next visit Follow up in 2-3 weeks

## 2017-02-02 ENCOUNTER — Telehealth: Payer: Self-pay | Admitting: *Deleted

## 2017-02-02 ENCOUNTER — Ambulatory Visit: Payer: Medicaid Other

## 2017-02-02 DIAGNOSIS — E876 Hypokalemia: Secondary | ICD-10-CM | POA: Insufficient documentation

## 2017-02-02 LAB — BMP8+ANION GAP
Anion Gap: 19 mmol/L — ABNORMAL HIGH (ref 10.0–18.0)
BUN / CREAT RATIO: 12 (ref 9–23)
BUN: 8 mg/dL (ref 6–24)
CALCIUM: 9.3 mg/dL (ref 8.7–10.2)
CO2: 31 mmol/L — ABNORMAL HIGH (ref 18–29)
Chloride: 91 mmol/L — ABNORMAL LOW (ref 96–106)
Creatinine, Ser: 0.66 mg/dL (ref 0.57–1.00)
GFR, EST AFRICAN AMERICAN: 120 mL/min/{1.73_m2} (ref >59)
GFR, EST NON AFRICAN AMERICAN: 104 mL/min/{1.73_m2} (ref >59)
Glucose: 124 mg/dL — ABNORMAL HIGH (ref 65–99)
POTASSIUM: 2.8 mmol/L — AB (ref 3.5–5.2)
Sodium: 141 mmol/L (ref 134–144)

## 2017-02-02 MED ORDER — POTASSIUM CHLORIDE ER 20 MEQ PO TBCR: 20.0000 meq | EXTENDED_RELEASE_TABLET | Freq: Two times a day (BID) | ORAL | 0 refills | Status: DC

## 2017-02-02 NOTE — Telephone Encounter (Signed)
-----   Message from Milagros Loll, MD sent at 02/02/2017 12:05 PM EST ----- Regarding: Potassium I tried calling her with no answer. "Home" number is disconnected. "Mobile" number says no one at that number with her name. Husband's "home" number with no answer.   Potassium 2.8. Wrote prescription for potassium chloride 23meq BID and recheck BMP in 4-5 days (standing order placed).   Could you help me with tracking her down?  Thanks! Dr. Randell Patient

## 2017-02-02 NOTE — Addendum Note (Signed)
Addended by: Jacques Earthly T on: 02/02/2017 12:05 PM   Modules accepted: Orders

## 2017-02-02 NOTE — Assessment & Plan Note (Addendum)
Potassium 2.8. Possibly from HCTZ therapy. Will start her on potassium chloride 35meq BID and recheck BMP in 4-5 days. Was not able to discuss with patient over phone. Will send message to triage RN.  ADDENDUM: BMP with K up to 3 from 2.8. Will continue 39meq daily. Follow up in 1 month.

## 2017-02-02 NOTE — Telephone Encounter (Signed)
I tried all ph#s also, I lm at one for a rtc, did not mention names just to call [phone]

## 2017-02-07 NOTE — Telephone Encounter (Signed)
Talked to pt's daughter; informed "Potassium 2.8. Wrote prescription for potassium chloride 17meq BID and recheck BMP in 4-5 days." per Dr Randell Patient. Daughter voiced understanding- will let her mother know to start taking med today and will check lab on Friday.  Lab appt scheduled for Friday 3/2 @ 1330 PM.

## 2017-02-11 ENCOUNTER — Telehealth: Payer: Self-pay | Admitting: *Deleted

## 2017-02-11 ENCOUNTER — Other Ambulatory Visit (INDEPENDENT_AMBULATORY_CARE_PROVIDER_SITE_OTHER): Payer: Self-pay

## 2017-02-11 DIAGNOSIS — I10 Essential (primary) hypertension: Secondary | ICD-10-CM

## 2017-02-11 NOTE — Telephone Encounter (Signed)
Thanks

## 2017-02-11 NOTE — Telephone Encounter (Signed)
Pt and daughter here to lab - had questions about s/s of low K+ and foods she can eat to keep K+. Stated she is taking med and had this problem since 2006. Gave them list of high potassium foods and informed of possible symptoms muscle weakness, cramps,tingling,palpitations.

## 2017-02-12 LAB — BMP8+ANION GAP
Anion Gap: 19 mmol/L — ABNORMAL HIGH (ref 10.0–18.0)
BUN/Creatinine Ratio: 22 (ref 9–23)
BUN: 14 mg/dL (ref 6–24)
CALCIUM: 9.5 mg/dL (ref 8.7–10.2)
CO2: 30 mmol/L — ABNORMAL HIGH (ref 18–29)
Chloride: 90 mmol/L — ABNORMAL LOW (ref 96–106)
Creatinine, Ser: 0.64 mg/dL (ref 0.57–1.00)
GFR, EST AFRICAN AMERICAN: 121 mL/min/{1.73_m2} (ref >59)
GFR, EST NON AFRICAN AMERICAN: 105 mL/min/{1.73_m2} (ref >59)
Glucose: 198 mg/dL — ABNORMAL HIGH (ref 65–99)
Potassium: 3 mmol/L — ABNORMAL LOW (ref 3.5–5.2)
Sodium: 139 mmol/L (ref 134–144)

## 2017-02-16 MED ORDER — POTASSIUM CHLORIDE ER 20 MEQ PO TBCR: 40.0000 meq | EXTENDED_RELEASE_TABLET | Freq: Every day | ORAL | 0 refills | Status: DC

## 2017-02-16 NOTE — Addendum Note (Signed)
Addended by: Jacques Earthly T on: 02/16/2017 10:36 AM   Modules accepted: Orders

## 2017-02-18 ENCOUNTER — Other Ambulatory Visit: Payer: Self-pay

## 2017-03-10 ENCOUNTER — Other Ambulatory Visit: Payer: Self-pay | Admitting: Oncology

## 2017-03-10 ENCOUNTER — Other Ambulatory Visit (INDEPENDENT_AMBULATORY_CARE_PROVIDER_SITE_OTHER): Payer: Self-pay

## 2017-03-10 ENCOUNTER — Other Ambulatory Visit: Payer: Self-pay | Admitting: *Deleted

## 2017-03-10 DIAGNOSIS — E876 Hypokalemia: Secondary | ICD-10-CM

## 2017-03-10 DIAGNOSIS — I10 Essential (primary) hypertension: Secondary | ICD-10-CM

## 2017-03-10 LAB — BASIC METABOLIC PANEL
ANION GAP: 10 (ref 5–15)
BUN: 7 mg/dL (ref 6–20)
CHLORIDE: 97 mmol/L — AB (ref 101–111)
CO2: 32 mmol/L (ref 22–32)
Calcium: 8.9 mg/dL (ref 8.9–10.3)
Creatinine, Ser: 0.58 mg/dL (ref 0.44–1.00)
GFR calc non Af Amer: >60 mL/min (ref >60)
Glucose, Bld: 135 mg/dL — ABNORMAL HIGH (ref 65–99)
Potassium: 3 mmol/L — ABNORMAL LOW (ref 3.5–5.1)
SODIUM: 139 mmol/L (ref 135–145)

## 2017-03-11 ENCOUNTER — Ambulatory Visit (INDEPENDENT_AMBULATORY_CARE_PROVIDER_SITE_OTHER): Payer: Self-pay | Admitting: Internal Medicine

## 2017-03-11 VITALS — BP 149/93 | HR 87 | Temp 98.2°F | Ht 64.0 in | Wt 180.0 lb

## 2017-03-11 DIAGNOSIS — I10 Essential (primary) hypertension: Secondary | ICD-10-CM

## 2017-03-11 DIAGNOSIS — Z79899 Other long term (current) drug therapy: Secondary | ICD-10-CM

## 2017-03-11 DIAGNOSIS — E876 Hypokalemia: Secondary | ICD-10-CM

## 2017-03-11 MED ORDER — TRIAMTERENE-HCTZ 37.5-25 MG PO TABS: 1.0000 | ORAL_TABLET | Freq: Every day | ORAL | 11 refills | Status: DC

## 2017-03-11 MED ORDER — POTASSIUM CHLORIDE ER 10 MEQ PO TBCR: 10.0000 meq | EXTENDED_RELEASE_TABLET | Freq: Every day | ORAL | 0 refills | Status: DC

## 2017-03-11 NOTE — Patient Instructions (Addendum)
For your blood pressure --continue amlodipine 10mg  a day --stop HCTZ --start triamterene-HCTZ 37.5-25mg  once a day  For your potassium, take one pill once a day until you run out. Your new blood pressure medicine should work on also increasing your potassium.  Please schedule a lab visit to check your potassium; Please schedule appointment with me in May.  It was nice meeting you!

## 2017-03-11 NOTE — Progress Notes (Signed)
   CC: hypertension  HPI:  [name] is a 51 y.o. with a PMH of hypertension presenting to clinic for follow up on hypertension and hypokalemia.  Patient was last seen in February after running out of her home meds; she was restarted on amlodipine 10mg  daily and HCTZ 25mg  daily. BMP at that visit showed hypokalemia to 2.8 and patient was started on KCl 79mEq BID. Patient had a lab visit yesterday prior to today's appointment showing K of 3.0. Patient states that she ran out of her potassium about a week ago. She states she has been taking her BP meds regularly and every day but had not taken any today. She is unable to name her meds, but knows she takes one in the morning and one in the evening. She states that she has a long history of hypokalemia since about 2007 and has been on and off of potassium supplementation. She states that when she doesn't take it and feels her potassium is low, she experiences diffuse tingling, bil shoulder discomfort, headaches and fatigue. She denies chest pain, shortness of breath, nausea, vomiting, vision or hearing changes.   Please see problem based Assessment and Plan for status of patients chronic conditions.  Past Medical History:  Diagnosis Date  . Hypertension     Review of Systems:   Review of Systems  Constitutional: Negative for chills, fever, malaise/fatigue and weight loss.  HENT: Negative for hearing loss.   Eyes: Negative for blurred vision and double vision.  Respiratory: Negative for shortness of breath.   Cardiovascular: Negative for chest pain, palpitations and leg swelling.  Gastrointestinal: Negative for abdominal pain, constipation, diarrhea, nausea and vomiting.  Genitourinary: Negative for dysuria.  Musculoskeletal: Positive for joint pain (bil shoulder discomfort only periodically when hypoK).  Neurological: Negative for dizziness, tingling, sensory change, focal weakness, loss of consciousness and headaches.    Physical  Exam:  Vitals:   03/11/17 1509  BP: (!) 149/93  Pulse: 87  Temp: 98.2 F (36.8 C)  TempSrc: Oral  SpO2: 99%  Weight: 180 lb (81.6 kg)  Height: 5\' 4"  (1.626 m)   Physical Exam  Constitutional: She is oriented to person, place, and time. She appears well-developed and well-nourished. No distress.  HENT:  Head: Normocephalic and atraumatic.  Eyes: EOM are normal. No scleral icterus.  Neck: Normal range of motion. Neck supple. No thyromegaly present.  Cardiovascular: Normal rate, regular rhythm, normal heart sounds and intact distal pulses.  Exam reveals no gallop and no friction rub.   No murmur heard. Pulmonary/Chest: Effort normal and breath sounds normal. She has no wheezes. She has no rales.  Abdominal: Soft. Bowel sounds are normal. She exhibits no distension. There is no tenderness. There is no guarding.  Musculoskeletal: Normal range of motion. She exhibits no edema.  Neurological: She is alert and oriented to person, place, and time. No cranial nerve deficit.  Skin: Skin is warm and dry. Capillary refill takes less than 2 seconds. No rash noted. She is not diaphoretic. No erythema.  Psychiatric: She has a normal mood and affect. Her behavior is normal. Judgment and thought content normal.    Assessment & Plan:   See Encounters Tab for problem based charting.   Patient discussed with Dr. Angelia Mould   Alphonzo Grieve, MD Internal Medicine PGY1

## 2017-03-13 NOTE — Assessment & Plan Note (Signed)
Patient was last seen in February after running out of her home meds; she was restarted on amlodipine 10mg  daily and HCTZ 25mg  daily. BMP at that visit showed hypokalemia to 2.8 and patient was started on KCl 42mEq BID. Patient had a lab visit yesterday prior to today's appointment showing K of 3.0. Patient states that she ran out of her potassium about a week ago. She states she has been taking her BP meds regularly and every day but had not taken any today. She is unable to name her meds, but knows she takes one in the morning and one in the evening. She states that she has a long history of hypokalemia since about 2007 and has been on and off of potassium supplementation. She states that when she doesn't take it and feels her potassium is low, she experiences diffuse tingling, bil shoulder discomfort, headaches and fatigue. She denies chest pain, shortness of breath, nausea, vomiting, vision or hearing changes.   Today BP remains elevated to 149/93 though patient has not taken her meds today  Plan: --continue amlodipine 10mg  daily --change HCTZ to triamterene-HCTZ 37.5-25mg  daily for BP and hypok --f/u in 2 months

## 2017-03-13 NOTE — Assessment & Plan Note (Signed)
Hypokalemic again on recheck to 3.0, but ran out of potassium a week prior.   Plan: --start triamterene-HCTZ 37.5-25mg  daily  --start potassium chloride 36mEq daily --f/u in one month for BMET only; will adjust accordingly.

## 2017-03-14 NOTE — Progress Notes (Signed)
Internal Medicine Clinic Attending  Case discussed with Dr. Svalina  at the time of the visit.  We reviewed the resident's history and exam and pertinent patient test results.  I agree with the assessment, diagnosis, and plan of care documented in the resident's note.  

## 2017-03-25 ENCOUNTER — Other Ambulatory Visit: Payer: Self-pay | Admitting: Pulmonary Disease

## 2017-03-25 DIAGNOSIS — I10 Essential (primary) hypertension: Secondary | ICD-10-CM

## 2017-03-25 NOTE — Telephone Encounter (Signed)
Refill Request   amLODipine (NORVASC) 5 MG tablet

## 2017-06-23 NOTE — Addendum Note (Signed)
Addended by: Alphonzo Grieve on: 06/23/2017 04:08 PM   Modules accepted: Orders

## 2017-06-25 ENCOUNTER — Other Ambulatory Visit: Payer: Self-pay | Admitting: Internal Medicine

## 2017-06-25 DIAGNOSIS — E876 Hypokalemia: Secondary | ICD-10-CM

## 2017-07-29 ENCOUNTER — Encounter: Payer: Self-pay | Admitting: Internal Medicine

## 2017-08-02 NOTE — Progress Notes (Deleted)
   CC: ***  HPI:  Ms.Luise Moman is a 51 y.o. with a PMH of ***  Please see problem based Assessment and Plan for status of patients chronic conditions.  Past Medical History:  Diagnosis Date  . Essential tremor 04/24/2013  . Hypertension   . Hypokalemia   . Migraine 12/31/2013    Review of Systems:   ROS  Physical Exam:  There were no vitals filed for this visit. Physical Exam  Assessment & Plan:   See Encounters Tab for problem based charting.   Patient {GC/GE:[id number]::"discussed with","seen with"} Dr. {NAMES:[id number]::"Butcher","Granfortuna","E. Hoffman","Klima","Mullen","Narendra","Vincent"}   Alphonzo Grieve, MD Internal Medicine PGY2

## 2017-08-03 ENCOUNTER — Encounter: Payer: Self-pay | Admitting: Internal Medicine

## 2017-09-07 ENCOUNTER — Encounter: Payer: Self-pay | Admitting: Internal Medicine

## 2017-09-07 ENCOUNTER — Ambulatory Visit (INDEPENDENT_AMBULATORY_CARE_PROVIDER_SITE_OTHER): Payer: Self-pay | Admitting: Internal Medicine

## 2017-09-07 VITALS — BP 177/100 | HR 99 | Temp 98.4°F | Ht 64.0 in | Wt 177.0 lb

## 2017-09-07 DIAGNOSIS — Z79899 Other long term (current) drug therapy: Secondary | ICD-10-CM

## 2017-09-07 DIAGNOSIS — E119 Type 2 diabetes mellitus without complications: Secondary | ICD-10-CM | POA: Insufficient documentation

## 2017-09-07 DIAGNOSIS — E118 Type 2 diabetes mellitus with unspecified complications: Secondary | ICD-10-CM

## 2017-09-07 DIAGNOSIS — K1370 Unspecified lesions of oral mucosa: Secondary | ICD-10-CM

## 2017-09-07 DIAGNOSIS — I10 Essential (primary) hypertension: Secondary | ICD-10-CM

## 2017-09-07 DIAGNOSIS — E876 Hypokalemia: Secondary | ICD-10-CM

## 2017-09-07 DIAGNOSIS — L299 Pruritus, unspecified: Secondary | ICD-10-CM | POA: Insufficient documentation

## 2017-09-07 DIAGNOSIS — R002 Palpitations: Secondary | ICD-10-CM | POA: Insufficient documentation

## 2017-09-07 DIAGNOSIS — R51 Headache: Secondary | ICD-10-CM

## 2017-09-07 LAB — GLUCOSE, CAPILLARY: Glucose-Capillary: 181 mg/dL — ABNORMAL HIGH (ref 65–99)

## 2017-09-07 LAB — POCT GLYCOSYLATED HEMOGLOBIN (HGB A1C): HEMOGLOBIN A1C: 7

## 2017-09-07 MED ORDER — POTASSIUM CHLORIDE ER 10 MEQ PO TBCR: 10.0000 meq | EXTENDED_RELEASE_TABLET | Freq: Every day | ORAL | 0 refills | Status: DC

## 2017-09-07 MED ORDER — METFORMIN HCL 500 MG PO TABS
500.0000 mg | ORAL_TABLET | Freq: Two times a day (BID) | ORAL | 2 refills | Status: DC
Start: 2017-09-07 — End: 2017-11-18

## 2017-09-07 NOTE — Patient Instructions (Signed)
Take amlodipine 1 pill daily Take triamterene-HCTZ 1 pill daily Take potassium one pill daily  Start taking metformin 1 pill (500mg ) with breakfast and with dinner. Work on increasing your activity and decreasing the amount of fruits, pasta, rice, potatoes and bread in your diet.  Please make an appointment in 1 month

## 2017-09-07 NOTE — Assessment & Plan Note (Signed)
Patient with pruritus after water exposure with no appreciable lesions or rash on exam.   Plan: --advised patient to avoid fragrant products for now --f/u Cmet for bili though not jaundiced or with scleral icterus on exam --f/u TSH --CBC recently normal and unlikely that she would suddenly develop polycythemia vera; if persists and all other work up normal, will consider rechecking

## 2017-09-07 NOTE — Assessment & Plan Note (Signed)
Uncontrolled due to patient sporadically taking medications.  Discussed with patient complications of untreated hypertension.  Plan: --restart amlodipine 5mg  daily and triamterene-HCTZ 27.5-25mg  daily --given good rx coupon for amlodipine which patient states would be manageable; triam/hctz on $4 list at [facility]

## 2017-09-07 NOTE — Assessment & Plan Note (Signed)
Patient with polydipsia, dry mouth, elevated BMI, and somewhat elevated glucose on past Bmets.  Plan: --screening A1c - 7.0; newly diagnosed T2DM --Patient in agreement with starting metformin and working on diet and exercise  --start metformin 500mg  BID --f/u in 3 months

## 2017-09-07 NOTE — Assessment & Plan Note (Signed)
Patient with new onset palpitations without associated symptoms. Exam is unremarkable.  Plan: --f/u TSH

## 2017-09-07 NOTE — Progress Notes (Addendum)
   CC: rash  HPI:  Ashley Huynh is a 51 y.o. with a PMH of HTN, hypokalemia, chronic headache presenting to clinic for evaluation of rash, f/u for hypertension and hypokalemia.  HTN: Patient states that she only intermittently takes her amlodipine 5mg  daily and triamterene-HCTZ 37.5-25mg  daily. She endorses chronic, unchanged headaches, and new onset short episodes of palpitations; she denies chest pain, shortness of breath, focal weakness, numbness, tingling, change in vision or hearing, LE edema.  Hypokalemia: Patient endorses overall feeling poorly when she is not taking potassium supplement. She has been out for 2-3 months. Her headaches are improved but not completely resolved when she is taking potassium. She denies muscle cramps.  Pruritus: Patient endorses 3 day history of burning, pruritus and erythema when in contact with water. It is generally diffuse but seems to be concentrated on her mid-back, mid chest, hands and genital region. She denies prior similar occurrences. She has stopped using body wash and soap as she though that might be contributing, however this has not changed her symptoms.   Patient endorses polydipsia and dry mouth, however denies polyuria and polyphagia.  She denies fevers, chills, abd pain, nausea, vomiting, dysuria, vaginal itching, vaginal discharge, diarrhea, constipation.   Please see problem based Assessment and Plan for status of patients chronic conditions.  Past Medical History:  Diagnosis Date  . Essential tremor 04/24/2013  . Hypertension   . Hypokalemia   . Migraine 12/31/2013    Review of Systems:   ROS Per HPI  Physical Exam:  Vitals:   09/07/17 1332  BP: (!) 177/100  Pulse: 99  Temp: 98.4 F (36.9 C)  TempSrc: Oral  SpO2: 100%  Weight: 177 lb (80.3 kg)  Height: 5\' 4"  (1.626 m)   GENERAL- alert, co-operative, appears as stated age, not in any distress. HEENT- Atraumatic, normocephalic, EOMI, oral mucosa appears moist,  thyroid non-tender and not enlarged; single, small ulcer on hard palate. CARDIAC- RRR, no murmurs, rubs or gallops. RESP- Moving equal volumes of air, and clear to auscultation bilaterally, no wheezes or crackles. ABDOMEN- Soft, nontender, bowel sounds present. NEURO- No obvious Cr N abnormality. EXTREMITIES- pulse 2+, symmetric, no pedal edema. SKIN- Warm, dry, no rash or lesion appreciated on back/chest/hands. PSYCH- Normal mood and affect, appropriate thought content and speech.  Assessment & Plan:   See Encounters Tab for problem based charting.   Patient discussed with Dr. Angelia Mould   Alphonzo Grieve, MD Internal Medicine PGY2

## 2017-09-07 NOTE — Assessment & Plan Note (Signed)
Patient with h/o hypokalemia that is symptomatic. She has been out of KCl for a few months now.  Plan: --f/u Cmet --refilled KCl 34mEq daily --advised patient to take her BP meds daily as triamterene is potassium sparring  --f/u in 1 month for repeat Bmet

## 2017-09-09 LAB — CMP14 + ANION GAP
ALBUMIN: 4.3 g/dL (ref 3.5–5.5)
ALK PHOS: 67 IU/L (ref 39–117)
ALT: 37 IU/L — ABNORMAL HIGH (ref 0–32)
ANION GAP: 19 mmol/L — AB (ref 10.0–18.0)
AST: 36 IU/L (ref 0–40)
Albumin/Globulin Ratio: 1.4 (ref 1.2–2.2)
BUN / CREAT RATIO: 14 (ref 9–23)
BUN: 11 mg/dL (ref 6–24)
CHLORIDE: 93 mmol/L — AB (ref 96–106)
CO2: 26 mmol/L (ref 20–29)
CREATININE: 0.8 mg/dL (ref 0.57–1.00)
Calcium: 9.5 mg/dL (ref 8.7–10.2)
GFR calc Af Amer: 100 mL/min/{1.73_m2} (ref >59)
GFR calc non Af Amer: 87 mL/min/{1.73_m2} (ref >59)
GLOBULIN, TOTAL: 3.1 g/dL (ref 1.5–4.5)
Glucose: 174 mg/dL — ABNORMAL HIGH (ref 65–99)
Potassium: 3.3 mmol/L — ABNORMAL LOW (ref 3.5–5.2)
SODIUM: 138 mmol/L (ref 134–144)
TOTAL PROTEIN: 7.4 g/dL (ref 6.0–8.5)

## 2017-09-09 LAB — TSH: TSH: 0.617 u[IU]/mL (ref 0.450–4.500)

## 2017-09-09 NOTE — Progress Notes (Signed)
Internal Medicine Clinic Attending  Case discussed with Dr. Svalina  at the time of the visit.  We reviewed the resident's history and exam and pertinent patient test results.  I agree with the assessment, diagnosis, and plan of care documented in the resident's note.  

## 2017-09-12 ENCOUNTER — Encounter: Payer: Self-pay | Admitting: Internal Medicine

## 2017-09-12 ENCOUNTER — Ambulatory Visit (HOSPITAL_COMMUNITY)
Admission: RE | Admit: 2017-09-12 | Discharge: 2017-09-12 | Disposition: A | Discharge status: Home / Self Care | Payer: Self-pay | Source: Ambulatory Visit | Attending: Internal Medicine | Admitting: Internal Medicine

## 2017-09-12 ENCOUNTER — Ambulatory Visit (INDEPENDENT_AMBULATORY_CARE_PROVIDER_SITE_OTHER): Payer: Self-pay | Admitting: Internal Medicine

## 2017-09-12 VITALS — BP 167/105 | HR 96 | Wt 176.4 lb

## 2017-09-12 DIAGNOSIS — E876 Hypokalemia: Secondary | ICD-10-CM

## 2017-09-12 DIAGNOSIS — G43909 Migraine, unspecified, not intractable, without status migrainosus: Secondary | ICD-10-CM

## 2017-09-12 DIAGNOSIS — R002 Palpitations: Secondary | ICD-10-CM

## 2017-09-12 DIAGNOSIS — Z79899 Other long term (current) drug therapy: Secondary | ICD-10-CM

## 2017-09-12 DIAGNOSIS — R9431 Abnormal electrocardiogram [ECG] [EKG]: Secondary | ICD-10-CM | POA: Insufficient documentation

## 2017-09-12 DIAGNOSIS — I10 Essential (primary) hypertension: Secondary | ICD-10-CM

## 2017-09-12 DIAGNOSIS — G43009 Migraine without aura, not intractable, without status migrainosus: Secondary | ICD-10-CM

## 2017-09-12 DIAGNOSIS — Z23 Encounter for immunization: Secondary | ICD-10-CM

## 2017-09-12 MED ORDER — AMLODIPINE BESYLATE 10 MG PO TABS: 10.0000 mg | ORAL_TABLET | Freq: Two times a day (BID) | ORAL | 3 refills | Status: DC

## 2017-09-12 MED ORDER — KETOROLAC TROMETHAMINE 30 MG/ML IJ SOLN
15.0000 mg | Freq: Once | INTRAMUSCULAR | Status: AC
  Administered 2017-09-12: 15 mg via INTRAMUSCULAR

## 2017-09-12 MED ORDER — LISINOPRIL 5 MG PO TABS: 5.0000 mg | ORAL_TABLET | Freq: Every day | ORAL | 1 refills | Status: DC

## 2017-09-12 MED ORDER — AMLODIPINE BESYLATE 10 MG PO TABS: 10.0000 mg | ORAL_TABLET | Freq: Every day | ORAL | 3 refills | Status: DC

## 2017-09-12 NOTE — Progress Notes (Signed)
   CC: headache, HTN  HPI:  [name] is a 51 y.o. with a PMH of HTN, hypokalemia, chronic headache presenting to clinic for headache, hypertension and palpitations.  Patient states that since starting metformin 3 days prior, she has had worsening of her chronic headaches and feels her blood pressure is more elevated.   Her headache is bitemporal, associated with photophobia and palpitations when leaning forward, but patient denies N/V, numbness, tingling, focal weakness, vision or hearing changes. Pain is throbbing in nature. She states that when her headaches are exacerbated, she usually goes to the ED and receives a shot which resolves her headache. She has tried tylenol and ibuprofen at home without relief.  Patient states compliance for the last 3 days of triamterene-HCTZ 37.5-25mg  daily and amlodipine 10mg  daily. Other than palpitation on leaning forward during exacerbations of her headache, she denies chest pain, shortness of breath.   Please see problem based Assessment and Plan for status of patients chronic conditions.  Past Medical History:  Diagnosis Date  . Essential tremor 04/24/2013  . Hypertension   . Hypokalemia   . Migraine 12/31/2013    Review of Systems:   ROS Per HPI  Physical Exam:  Vitals:   09/12/17 1503 09/12/17 1528  BP: (!) 171/106 (!) 167/105  Pulse: (!) 101 96  SpO2: 100%   Weight: 176 lb 6.4 oz (80 kg)    GENERAL- alert, co-operative, appears as stated age, appears in mild distress. HEENT- Atraumatic, normocephalic, PERRL, EOMI, oral mucosa appears moist, no meningismus CARDIAC- RRR, no murmurs, rubs or gallops. RESP- Moving equal volumes of air, and clear to auscultation bilaterally, no wheezes or crackles. ABDOMEN- Soft, nontender, bowel sounds present. NEURO- No Cr N abnormality. EXTREMITIES- pulse 2+, symmetric, no pedal edema. SKIN- Warm, dry, no rash or lesion. PSYCH- Normal mood and affect, appropriate thought content and  speech.  Assessment & Plan:   See Encounters Tab for problem based charting.   Patient discussed with Dr. Abelardo Diesel, MD Internal Medicine PGY2

## 2017-09-12 NOTE — Patient Instructions (Addendum)
For your blood pressure: Amlodipine 10mg  daily, one tablet daily (I refilled it) Triamterene-HCTZ 37.5-25mg  daily, one tablet daily START lisinopril 5mg  daily  Make an appointment to be seen in 1 month.  Make an appointment to see Marlana Latus, our financial counselor for consideration for orange card for medical coverage.

## 2017-09-13 ENCOUNTER — Encounter: Payer: Self-pay | Admitting: Internal Medicine

## 2017-09-13 ENCOUNTER — Ambulatory Visit: Payer: Self-pay

## 2017-09-13 DIAGNOSIS — E119 Type 2 diabetes mellitus without complications: Secondary | ICD-10-CM

## 2017-09-13 HISTORY — DX: Type 2 diabetes mellitus without complications: E11.9

## 2017-09-13 NOTE — Assessment & Plan Note (Signed)
Patient with palpitations during headaches when leaning forward only, however had at last visit mentioned general intermittent palpitations that are not associated with other symptoms.  TSH checked at last visit was normal. EKG and rhythm strip obtained today; personally reviewed and reveals NSR, with no ST or T wave ischemic changes  Plan: --continue monitoring --advised patient to pursue orange card, then will likely refer for 30 day event monitor

## 2017-09-13 NOTE — Assessment & Plan Note (Addendum)
Patient with exacerbation of her chronic headache with no focal neurological signs or symptoms.   Patient given IM toradol 15mg  with complete resolution of her symptoms.  Plan: --will continue to address BP to hopefully decrease her headaches

## 2017-09-13 NOTE — Assessment & Plan Note (Addendum)
BP uncontrolled on triamterene-HCTZ 37.5-25mg  daily and amlodipine 10mg  daily.   Plan: --continue above, refilled amlodipine --start lisinopril 5mg  daily, will hopefully help with her hypokalemia as well --f/u in 1 month as previously planned, with repeat Bmet

## 2017-09-20 ENCOUNTER — Ambulatory Visit: Payer: Self-pay

## 2017-09-20 NOTE — Progress Notes (Signed)
Internal Medicine Clinic Attending  Case discussed with Dr. Svalina  at the time of the visit.  We reviewed the resident's history and exam and pertinent patient test results.  I agree with the assessment, diagnosis, and plan of care documented in the resident's note.  

## 2017-09-22 ENCOUNTER — Ambulatory Visit: Payer: Self-pay

## 2017-10-12 ENCOUNTER — Ambulatory Visit (INDEPENDENT_AMBULATORY_CARE_PROVIDER_SITE_OTHER): Payer: Self-pay | Admitting: Internal Medicine

## 2017-10-12 ENCOUNTER — Encounter: Payer: Self-pay | Admitting: Internal Medicine

## 2017-10-12 VITALS — BP 144/82 | HR 82 | Temp 98.3°F | Ht 64.0 in | Wt 177.0 lb

## 2017-10-12 DIAGNOSIS — E118 Type 2 diabetes mellitus with unspecified complications: Secondary | ICD-10-CM

## 2017-10-12 DIAGNOSIS — E119 Type 2 diabetes mellitus without complications: Secondary | ICD-10-CM

## 2017-10-12 DIAGNOSIS — Z872 Personal history of diseases of the skin and subcutaneous tissue: Secondary | ICD-10-CM

## 2017-10-12 DIAGNOSIS — R21 Rash and other nonspecific skin eruption: Secondary | ICD-10-CM

## 2017-10-12 DIAGNOSIS — L739 Follicular disorder, unspecified: Secondary | ICD-10-CM

## 2017-10-12 DIAGNOSIS — L02215 Cutaneous abscess of perineum: Secondary | ICD-10-CM

## 2017-10-12 DIAGNOSIS — N760 Acute vaginitis: Secondary | ICD-10-CM | POA: Insufficient documentation

## 2017-10-12 DIAGNOSIS — Z79899 Other long term (current) drug therapy: Secondary | ICD-10-CM

## 2017-10-12 DIAGNOSIS — L0231 Cutaneous abscess of buttock: Secondary | ICD-10-CM

## 2017-10-12 DIAGNOSIS — E876 Hypokalemia: Secondary | ICD-10-CM

## 2017-10-12 DIAGNOSIS — I10 Essential (primary) hypertension: Secondary | ICD-10-CM

## 2017-10-12 DIAGNOSIS — Z7984 Long term (current) use of oral hypoglycemic drugs: Secondary | ICD-10-CM

## 2017-10-12 DIAGNOSIS — L700 Acne vulgaris: Secondary | ICD-10-CM

## 2017-10-12 LAB — GLUCOSE, CAPILLARY: GLUCOSE-CAPILLARY: 140 mg/dL — AB (ref 65–99)

## 2017-10-12 MED ORDER — SULFAMETHOXAZOLE-TRIMETHOPRIM 800-160 MG PO TABS: 1.0000 | ORAL_TABLET | Freq: Two times a day (BID) | ORAL | 0 refills | Status: DC

## 2017-10-12 MED ORDER — SULFAMETHOXAZOLE-TRIMETHOPRIM 800-160 MG PO TABS: 1.0000 | ORAL_TABLET | Freq: Two times a day (BID) | ORAL | 0 refills | Status: AC

## 2017-10-12 NOTE — Patient Instructions (Addendum)
Start taking bactrim one pill once a day for 14 days.  I will call you with the results of your tests and if we need to change therapy.  Continue your blood pressure medicines.

## 2017-10-12 NOTE — Progress Notes (Signed)
   CC: rash, HTN  HPI:  [name] is a 51 y.o. with a PMH of HTN, hypokalemia, T2DM,  presenting to clinic for follow up on her HTN and evaluation of rash.  HTN: Patient states she has been adherent to triamterene-HCTZ 37.5-25mg  daily, amlodipine 10mg  daily and lisinopril 5mg  daily. She denies chest pain, shortness of breath, vision or hearing changes.   Folliculitis:  Patient endorses a couple week history of boils on her face which are pruritic and mildly tender. She endorses prior history of these, last episode about 2 years ago. She denies drainage. From note review patient was placed on multiple different antibiotic regimens with little improvement, however then seems to resolve and has not been an issue for her since.   Perineal abscess Vulvovaginitis: Patient endorses facial rash also present in her genital area for same duration of time with no prior episodes. Patient endorses associated vaginal pruritus and white, malodorous discharge. She denies dysuria, hematuria, fevers, chills, nausea, vomiting.  Please see problem based Assessment and Plan for status of patients chronic conditions.  Past Medical History:  Diagnosis Date  . Essential tremor 04/24/2013  . Hypertension   . Hypokalemia   . Migraine 12/31/2013    Review of Systems:   ROS Per HPI  Physical Exam:  Vitals:   10/12/17 1523  BP: (!) 144/82  Pulse: 82  Temp: 98.3 F (36.8 C)  TempSrc: Oral  SpO2: 99%  Weight: 177 lb (80.3 kg)  Height: 5\' 4"  (1.626 m)   GENERAL- alert, co-operative, appears as stated age, not in any distress. HEENT- Atraumatic, normocephalic, EOMI, oral mucosa appears moist CARDIAC- RRR, no murmurs, rubs or gallops. RESP- Moving equal volumes of air, and clear to auscultation bilaterally, no wheezes or crackles. ABDOMEN- Soft, nontender, bowel sounds present. NEURO- No obvious Cr N abnormality. EXTREMITIES- pulse 2+, symmetric, no pedal edema. SKIN- comedones and papules on  bilateral cheeks - nondraining, nontender. 2x1cm abscess on left buttock with no current drainage - TTP, with no surrounding induration or cellulitis, has access for drainage. Gyn: Gluteal abscess as above; speculum exam reveals mild, grey cervical drainage. PSYCH- Normal mood and affect, appropriate thought content and speech.  Assessment & Plan:   See Encounters Tab for problem based charting.   Patient discussed with Dr. Andris Baumann, MD Internal Medicine PGY2

## 2017-10-13 LAB — BMP8+ANION GAP
ANION GAP: 17 mmol/L (ref 10.0–18.0)
BUN/Creatinine Ratio: 19 (ref 9–23)
BUN: 11 mg/dL (ref 6–24)
CO2: 28 mmol/L (ref 20–29)
CREATININE: 0.59 mg/dL (ref 0.57–1.00)
Calcium: 9.2 mg/dL (ref 8.7–10.2)
Chloride: 98 mmol/L (ref 96–106)
GFR calc Af Amer: 124 mL/min/{1.73_m2} (ref >59)
GFR calc non Af Amer: 108 mL/min/{1.73_m2} (ref >59)
Glucose: 104 mg/dL — ABNORMAL HIGH (ref 65–99)
Potassium: 3.2 mmol/L — ABNORMAL LOW (ref 3.5–5.2)
SODIUM: 143 mmol/L (ref 134–144)

## 2017-10-13 LAB — CERVICOVAGINAL ANCILLARY ONLY
BACTERIAL VAGINITIS: NEGATIVE
CANDIDA VAGINITIS: NEGATIVE
CHLAMYDIA, DNA PROBE: NEGATIVE
NEISSERIA GONORRHEA: NEGATIVE
TRICH (WINDOWPATH): NEGATIVE

## 2017-10-14 ENCOUNTER — Encounter: Payer: Self-pay | Admitting: Internal Medicine

## 2017-10-14 NOTE — Assessment & Plan Note (Signed)
BP with better control on triamterene-HCTZ 37.5-25mg  daily, amlodipine 10mg  daily and lisinopril 5mg  daily.  Plan: --f/u Bmet - Na and renal function wnl; K still low at 3.2 - will have patient restart her Kdur 44mEq daily --f/u in 2 months, and consider inc lisinopril to 10mg  daily if BP not controlled

## 2017-10-14 NOTE — Assessment & Plan Note (Addendum)
Patient with reported malodorous discharge and vaginal itching - speculum exam remarkable for mild cervical drainage.  Plan: --f/u Wet prep - neg for candida, bac vaginitis, GC/Chlamydia, Trichomonas --it is likely that her gluteal abscess was spontaneously draining and causing above symptoms due to close proximity  --address abscess with warm compresses, bactrim - pt to f/u if no improvement or worsening symptoms, can consider I&D in future

## 2017-10-14 NOTE — Assessment & Plan Note (Addendum)
Patient with multiple comedones and pustules on periphery of her face that are nondraining.   Plan: --bactrim DS BID x14d --f/u if no improvement or worsening symptoms

## 2017-10-14 NOTE — Assessment & Plan Note (Signed)
Bmet checked and reveals K 3.2 - patient states compliance with potassium sparing antihypertensives. Will restart supplementing with daily Kdur 44mEq and repeat Bmet at f/u visit.

## 2017-10-14 NOTE — Assessment & Plan Note (Signed)
Patient states compliance with metformin 500mg  BID. We again discussed diet and lifestyle changes for optimal control of her newly diagnosed diabetes. She does note that rice is a staple of a lot of her meals but she has switched to brown rice recently. She is working on increasing her daily activity with walks.

## 2017-10-24 NOTE — Progress Notes (Signed)
Case discussed with Dr. Svalina at the time of the visit.  We reviewed the resident's history and exam and pertinent patient test results.  I agree with the assessment, diagnosis and plan of care documented in the resident's note. 

## 2017-11-18 ENCOUNTER — Ambulatory Visit (INDEPENDENT_AMBULATORY_CARE_PROVIDER_SITE_OTHER): Payer: Self-pay | Admitting: Internal Medicine

## 2017-11-18 ENCOUNTER — Encounter: Payer: Self-pay | Admitting: Internal Medicine

## 2017-11-18 ENCOUNTER — Other Ambulatory Visit: Payer: Self-pay

## 2017-11-18 VITALS — BP 144/98 | HR 89 | Temp 98.2°F | Wt 171.2 lb

## 2017-11-18 DIAGNOSIS — I10 Essential (primary) hypertension: Secondary | ICD-10-CM

## 2017-11-18 DIAGNOSIS — E119 Type 2 diabetes mellitus without complications: Secondary | ICD-10-CM

## 2017-11-18 DIAGNOSIS — Z79899 Other long term (current) drug therapy: Secondary | ICD-10-CM

## 2017-11-18 DIAGNOSIS — E118 Type 2 diabetes mellitus with unspecified complications: Secondary | ICD-10-CM

## 2017-11-18 DIAGNOSIS — E876 Hypokalemia: Secondary | ICD-10-CM

## 2017-11-18 LAB — GLUCOSE, CAPILLARY: Glucose-Capillary: 126 mg/dL — ABNORMAL HIGH (ref 65–99)

## 2017-11-18 LAB — POCT GLYCOSYLATED HEMOGLOBIN (HGB A1C): Hemoglobin A1C: 6.6

## 2017-11-18 MED ORDER — LISINOPRIL 10 MG PO TABS: 5.0000 mg | ORAL_TABLET | Freq: Every day | ORAL | 0 refills | Status: DC

## 2017-11-18 NOTE — Patient Instructions (Signed)
Ashley Huynh,  You A1c today was 6.6. You are doing a great job with your diabetes. I think we can stop taking the metformin as I am worried you have having low blood sugars at home.  Your blood pressure is doing better today. I would like to increase the lisinopril to 10 mg daily and continue the amlodipine 10 mg daily.   Please follow up with Dr. Jari Favre in January.  FOLLOW-UP INSTRUCTIONS When: January For: BP, DM What to bring: Your medications

## 2017-11-18 NOTE — Progress Notes (Signed)
    CC: HTN follow up  HPI:  [name] is a 51 y.o. female with a past medical history listed below here today for follow up of her HTN.  For details of today's visit and the status of her chronic medical issues please refer to the assessment and plan.   Past Medical History:  Diagnosis Date  . Essential tremor 04/24/2013  . Hypertension   . Hypokalemia   . Migraine 12/31/2013  . T2DM (type 2 diabetes mellitus) ([place]) 09/13/2017   Review of Systems:   No chest pain or shortness of breath  Physical Exam:  Vitals:   11/18/17 1426  BP: (!) 144/98  Pulse: 89  SpO2: 100%  Weight: 171 lb 3.2 oz (77.7 kg)   Physical Exam  Constitutional: She is well-developed, well-nourished, and in no distress.  HENT:  Head: Normocephalic and atraumatic.  Eyes: Conjunctivae are normal. Pupils are equal, round, and reactive to light.  Cardiovascular: Normal rate and regular rhythm.  Pulmonary/Chest: Effort normal and breath sounds normal.  Abdominal: Soft. Bowel sounds are normal.  Skin: Skin is warm and dry.  Psychiatric: Mood and affect normal.  Vitals reviewed.  Assessment & Plan:   See Encounters Tab for problem based charting.  Patient discussed with Dr. Daryll Drown

## 2017-11-19 LAB — BMP8+ANION GAP
Anion Gap: 17 mmol/L (ref 10.0–18.0)
BUN / CREAT RATIO: 18 (ref 9–23)
BUN: 13 mg/dL (ref 6–24)
CHLORIDE: 98 mmol/L (ref 96–106)
CO2: 25 mmol/L (ref 20–29)
Calcium: 10 mg/dL (ref 8.7–10.2)
Creatinine, Ser: 0.74 mg/dL (ref 0.57–1.00)
GFR calc non Af Amer: 95 mL/min/{1.73_m2} (ref >59)
GFR, EST AFRICAN AMERICAN: 110 mL/min/{1.73_m2} (ref >59)
GLUCOSE: 109 mg/dL — AB (ref 65–99)
POTASSIUM: 3.8 mmol/L (ref 3.5–5.2)
SODIUM: 140 mmol/L (ref 134–144)

## 2017-11-19 LAB — MAGNESIUM: MAGNESIUM: 1.8 mg/dL (ref 1.6–2.3)

## 2017-11-21 NOTE — Assessment & Plan Note (Signed)
Lab Results  Component Value Date   HGBA1C 6.6 11/18/2017   HGBA1C 7.0 09/07/2017    Patient currently on metformin 500 mg bid. A1C today is 6.6. She has an intentional 8 lb weight loss since last visit. Report diet and lifestyle changes. Notes that she is having weekly episodes of feeling tired, lightheaded, like she is going to pass out. Reports she eats some fruit or drinks orange juice and feels better. Has not lost consciousness.   A/P May be having hypoglycemic events. CBG 126 today and asymptomatic. Will discontinue metformin today. If symptoms persist, return to clinic for further evaluation.

## 2017-11-21 NOTE — Assessment & Plan Note (Signed)
BP Readings from Last 3 Encounters:  11/18/17 (!) 144/98  10/12/17 (!) 144/82  09/12/17 (!) 167/105    Lab Results  Component Value Date   NA 140 11/18/2017   K 3.8 11/18/2017   CREATININE 0.74 11/18/2017   BP 144/98 today. She is currently prescribed triamterene-hctz 37.5-25 mg daily, amlodipine 10 mg daily and lisinopril 5 mg daily. However, she only has amlodipine and lisinopril with her today and reports no knowledge of the triamterene-hctz. Her pharmacy verifies that it has been several months since this medication was refilled. She also reports no taking her K supplement unless she 'feels like her potassium is low' which is infrequent.  She denies any chest pain, shortness of breath, headaches.  A/P: BP much improved to previous. Suspect previously her BP was difficult to control due to noncompliance with medications. Of note, she has been persistently hypokalemic. Will check BMET today. If she remains hypoklameic will pursue further workup for hyperaldo and consider RTA if that is unremarkable.  Will increase her lisinopril to 10 mg daily, continue amlodipine 10 mg daily. Discontinue previous triamterene-hctz.  BMET today with K 3.8. Will not pursue further work up at this time.

## 2017-11-21 NOTE — Progress Notes (Signed)
Internal Medicine Clinic Attending  Case discussed with Dr. Boswell at the time of the visit.  We reviewed the resident's history and exam and pertinent patient test results.  I agree with the assessment, diagnosis, and plan of care documented in the resident's note.  

## 2017-12-09 ENCOUNTER — Ambulatory Visit: Payer: Self-pay

## 2018-02-14 ENCOUNTER — Other Ambulatory Visit: Payer: Self-pay | Admitting: *Deleted

## 2018-02-14 DIAGNOSIS — I10 Essential (primary) hypertension: Secondary | ICD-10-CM

## 2018-02-14 MED ORDER — LISINOPRIL 10 MG PO TABS: 10.0000 mg | ORAL_TABLET | Freq: Every day | ORAL | 1 refills | Status: DC

## 2018-02-15 ENCOUNTER — Encounter: Payer: Self-pay | Admitting: Internal Medicine

## 2018-03-07 NOTE — Progress Notes (Signed)
   CC: hypertension  HPI:  [name] is a 52 y.o. with a PMH of HTN, hypokalemia, T2DM,  presenting to clinic for follow up on her HTN.  HTN: Patient supposed to be on lisinopril 10mg  daily and amlodipine 10mg  daily. At the last clinic visit, triamterene-HCTZ was discontinued. She thinks she is taking the lisinopril 10mg  daily and amlodipine 20mg  daily. She sometimes skips a day of lisinopril. She has not taken her meds today; endorses improvement in her headaches; denies vision or hearing changes, focal weakness or numbness, chest pain, shortness of breath.  T2DM: At last visit, patient was suspected of having hypoglycemic episodes in setting of weight loss and metformin use (500mg  BID). Last A1c was 6.6 in Nov 2018. She denies further episodes. She states she continues to make diet changes and increasing her activity. She denies polyuria or polydipsia.  Please see problem based Assessment and Plan for status of patients chronic conditions.  Past Medical History:  Diagnosis Date  . Essential tremor 04/24/2013  . Hypertension   . Hypokalemia   . Migraine 12/31/2013  . T2DM (type 2 diabetes mellitus) ([place]) 09/13/2017    Review of Systems:   ROS Per HPI  Physical Exam:  Vitals:   03/08/18 1514 03/08/18 1536  BP: (!) 145/92 (!) 142/92  Pulse: 89   Temp: 98 F (36.7 C)   TempSrc: Oral   SpO2: 97%   Weight: 175 lb 1.6 oz (79.4 kg)   Height: 5\' 4"  (1.626 m)    GENERAL- alert, co-operative, appears as stated age, not in any distress. HEENT- Atraumatic, normocephalic, EOMI, oral mucosa appears moist CARDIAC- RRR, no murmurs, rubs or gallops. RESP- Moving equal volumes of air, and clear to auscultation bilaterally, no wheezes or crackles. ABDOMEN- Soft, nontender, bowel sounds present. NEURO- No obvious Cr N abnormality. EXTREMITIES- pulse 2+, symmetric, no pedal edema. SKIN- Warm, dry. PSYCH- Normal mood and affect, appropriate thought content and speech.  Assessment &  Plan:   See Encounters Tab for problem based charting.   Patient discussed with Dr. Abelardo Diesel, MD Internal Medicine PGY2

## 2018-03-08 ENCOUNTER — Encounter: Payer: Self-pay | Admitting: Internal Medicine

## 2018-03-08 ENCOUNTER — Ambulatory Visit (INDEPENDENT_AMBULATORY_CARE_PROVIDER_SITE_OTHER): Payer: Self-pay | Admitting: Internal Medicine

## 2018-03-08 ENCOUNTER — Encounter (INDEPENDENT_AMBULATORY_CARE_PROVIDER_SITE_OTHER): Payer: Self-pay

## 2018-03-08 ENCOUNTER — Other Ambulatory Visit: Payer: Self-pay

## 2018-03-08 VITALS — BP 142/92 | HR 89 | Temp 98.0°F | Ht 64.0 in | Wt 175.1 lb

## 2018-03-08 DIAGNOSIS — I10 Essential (primary) hypertension: Secondary | ICD-10-CM

## 2018-03-08 DIAGNOSIS — E119 Type 2 diabetes mellitus without complications: Secondary | ICD-10-CM

## 2018-03-08 DIAGNOSIS — Z79899 Other long term (current) drug therapy: Secondary | ICD-10-CM

## 2018-03-08 DIAGNOSIS — E876 Hypokalemia: Secondary | ICD-10-CM

## 2018-03-08 DIAGNOSIS — E118 Type 2 diabetes mellitus with unspecified complications: Secondary | ICD-10-CM

## 2018-03-08 DIAGNOSIS — Z7984 Long term (current) use of oral hypoglycemic drugs: Secondary | ICD-10-CM

## 2018-03-08 LAB — GLUCOSE, CAPILLARY: Glucose-Capillary: 125 mg/dL — ABNORMAL HIGH (ref 65–99)

## 2018-03-08 LAB — POCT GLYCOSYLATED HEMOGLOBIN (HGB A1C): HEMOGLOBIN A1C: 7.6

## 2018-03-08 MED ORDER — METFORMIN HCL 500 MG PO TABS: 500.0000 mg | ORAL_TABLET | Freq: Every day | ORAL | 1 refills | Status: DC

## 2018-03-08 NOTE — Patient Instructions (Addendum)
For your blood pressure: Take lisinopril 10mg  daily (one pill) Take amlodipine 10mg  daily (one pill)  For your diabetes, start taking metformin 500mg  (one tab) once a day Continue working on diet changes and exercise  Come back in 4 weeks for recheck of blood pressure before you leave for your trip.  I have given you the script for a glucose meter that you can take to the health department. The health department can also help you get all your vaccinations that you need prior to your trip.

## 2018-03-09 LAB — BMP8+ANION GAP
ANION GAP: 15 mmol/L (ref 10.0–18.0)
BUN/Creatinine Ratio: 20 (ref 9–23)
BUN: 10 mg/dL (ref 6–24)
CO2: 30 mmol/L — ABNORMAL HIGH (ref 20–29)
Calcium: 9.6 mg/dL (ref 8.7–10.2)
Chloride: 94 mmol/L — ABNORMAL LOW (ref 96–106)
Creatinine, Ser: 0.51 mg/dL — ABNORMAL LOW (ref 0.57–1.00)
GFR calc Af Amer: 130 mL/min/{1.73_m2} (ref >59)
GFR calc non Af Amer: 112 mL/min/{1.73_m2} (ref >59)
Glucose: 124 mg/dL — ABNORMAL HIGH (ref 65–99)
POTASSIUM: 2.8 mmol/L — AB (ref 3.5–5.2)
Sodium: 139 mmol/L (ref 134–144)

## 2018-03-11 ENCOUNTER — Encounter: Payer: Self-pay | Admitting: Internal Medicine

## 2018-03-11 MED ORDER — POTASSIUM CHLORIDE ER 10 MEQ PO TBCR: EXTENDED_RELEASE_TABLET | ORAL | 0 refills | Status: DC

## 2018-03-11 NOTE — Assessment & Plan Note (Addendum)
K down to 2.8 today; she was asymptomatic.   Plan: --Kdur 59mEq x2 days then 9mEq daily --f/u in 2 weeks; discussed with patient on the phone - she is in agreement

## 2018-03-11 NOTE — Assessment & Plan Note (Addendum)
Patient supposed to be on lisinopril 10mg  daily and amlodipine 10mg  daily. At the last clinic visit, triamterene-HCTZ was discontinued. She thinks she is taking the lisinopril 10mg  daily and amlodipine 20mg  daily. She sometimes skips a day of lisinopril. She endorses improvement in her headaches; denies vision or hearing changes, focal weakness or numbness, chest pain, shortness of breath.  She has not taken her meds today; BP is persistently mildly elevated in 140s/90s.  Plan: --f/u Bmet - K 2.8 - will give her 18mEq x2 days then 72mEq daily --bicarb is elevated so not RTA --continue lisinopril 10mg  daily, amlodipine 10mg  daily - re-iterated dosing --f/u in 2 wks for BP check with her medicines - discussed w/ pt on phone and she is in agreement -- if confirmed she is not and has not been taking diuretic, can consider hyperaldo testing with plasma aldosterone concentration and plasma renin activity, vs trial of spironolactone

## 2018-03-11 NOTE — Assessment & Plan Note (Signed)
At last visit, patient was suspected of having hypoglycemic episodes in setting of weight loss and metformin use (500mg  BID). Last A1c was 6.6 in Nov 2018. She denies further episodes. She states she continues to make diet changes and increasing her activity. She denies polyuria or polydipsia.  Plan: --A1c 7.6 today, worsening --weight is back up  --start metformin 500mg  daily --f/u A1c in 3 months

## 2018-03-13 NOTE — Progress Notes (Signed)
Internal Medicine Clinic Attending  Case discussed with Dr. Svalina  at the time of the visit.  We reviewed the resident's history and exam and pertinent patient test results.  I agree with the assessment, diagnosis, and plan of care documented in the resident's note.  

## 2018-03-29 ENCOUNTER — Ambulatory Visit: Payer: Self-pay

## 2018-03-30 ENCOUNTER — Ambulatory Visit (INDEPENDENT_AMBULATORY_CARE_PROVIDER_SITE_OTHER): Payer: Self-pay | Admitting: Internal Medicine

## 2018-03-30 ENCOUNTER — Other Ambulatory Visit: Payer: Self-pay

## 2018-03-30 ENCOUNTER — Encounter: Payer: Self-pay | Admitting: Internal Medicine

## 2018-03-30 VITALS — BP 161/92 | HR 88 | Temp 97.9°F | Ht 64.0 in | Wt 179.6 lb

## 2018-03-30 DIAGNOSIS — Z7189 Other specified counseling: Secondary | ICD-10-CM

## 2018-03-30 DIAGNOSIS — Z7984 Long term (current) use of oral hypoglycemic drugs: Secondary | ICD-10-CM

## 2018-03-30 DIAGNOSIS — Z7184 Encounter for health counseling related to travel: Secondary | ICD-10-CM

## 2018-03-30 DIAGNOSIS — E119 Type 2 diabetes mellitus without complications: Secondary | ICD-10-CM

## 2018-03-30 DIAGNOSIS — Z79899 Other long term (current) drug therapy: Secondary | ICD-10-CM

## 2018-03-30 DIAGNOSIS — E876 Hypokalemia: Secondary | ICD-10-CM

## 2018-03-30 DIAGNOSIS — I10 Essential (primary) hypertension: Secondary | ICD-10-CM

## 2018-03-30 LAB — GLUCOSE, CAPILLARY: Glucose-Capillary: 253 mg/dL — ABNORMAL HIGH (ref 65–99)

## 2018-03-30 MED ORDER — LISINOPRIL 10 MG PO TABS: 10.0000 mg | ORAL_TABLET | Freq: Every day | ORAL | 0 refills | Status: DC

## 2018-03-30 MED ORDER — SPIRONOLACTONE 25 MG PO TABS: 12.5000 mg | ORAL_TABLET | Freq: Every day | ORAL | 0 refills | Status: DC

## 2018-03-30 MED ORDER — AMLODIPINE BESYLATE 10 MG PO TABS: 10.0000 mg | ORAL_TABLET | Freq: Every day | ORAL | 3 refills | Status: DC

## 2018-03-30 MED ORDER — POTASSIUM CHLORIDE ER 10 MEQ PO TBCR: EXTENDED_RELEASE_TABLET | ORAL | 0 refills | Status: DC

## 2018-03-30 MED ORDER — LISINOPRIL 10 MG PO TABS: 10.0000 mg | ORAL_TABLET | Freq: Every day | ORAL | 1 refills | Status: DC

## 2018-03-30 MED ORDER — AMLODIPINE BESYLATE 10 MG PO TABS: 10.0000 mg | ORAL_TABLET | Freq: Every day | ORAL | 0 refills | Status: DC

## 2018-03-30 NOTE — Assessment & Plan Note (Addendum)
Assessment: She is hypertensive today to 154/89, which actually worsened on re-check to 163/116. She admits to being "mostly" compliant with her blood pressure medications and did not take them this morning.   Plan: Encouraged strict compliance. Overall, she would benefit from a more controlled blood pressure. Patient is to continue Lisinopril and Amlodipine, but have prescribed Spironolactone 12.5mg  daily. This should also help with her chronic hypokalemia.

## 2018-03-30 NOTE — Assessment & Plan Note (Signed)
Assessment: Metformin was restarted at her clinic visit a few weeks ago when A1c returned as 7.6%. She had recurrence of her night sweats and weakness just after resuming it and is concerned she may be having hypoglycemic episodes. She stopped this medication about a week ago and has had resolution of these symptoms. She does report some increased thirst since this time, but overall feels well.   Plan: Given her upcoming prolonged trip out of the country, I am extremely hesitant to start a medication that would not be closely followed, especially given her concern for hypoglycemia on Metformin which is an agent not typically associated with hyperglycemia.   She was well-controlled off of any anti-diabetic agents, and has been able to lower her A1c in the past with diet and weight loss. She will try splitting the metformin in half, but we discussed that it's reasonable to hold metformin for now, and work on diet/weight loss to control her hyperglycemia.

## 2018-03-30 NOTE — Assessment & Plan Note (Addendum)
Assessment and Plan: Patient requests a 4 month supply of her medication as she will be leaving in 3 weeks for a 29-month trip to [place]. I've sent a 90-day supply of her Lisinopril, Amlodipine and Spironolactone to her pharmacy with instructions that it is OK to fill prior to typical refill date. I have also provided printed prescriptions for an additional month for her to take with her, in case these are needed. She has history of medication non-compliance, and while I hope she takes her medications daily, I suspect a 90-day supply should last the duration of her trip.

## 2018-03-30 NOTE — Progress Notes (Signed)
   CC: follow-up of HTN, travel advice  HPI:  Ashley Huynh is a 52 y.o. F with type 2 diabetes, hypertension and chronic hypokalemia who presents today for blood pressure recheck. She also has complaint of metformin side effect and also would like four-month supply of her prescriptions as she'll be leaving for [place].  For details regarding today's visit and the status of their chronic medical issues, please refer to the assessment and plan.  Past Medical History:  Diagnosis Date  . Essential tremor 04/24/2013  . Hypertension   . Hypokalemia   . Migraine 12/31/2013  . T2DM (type 2 diabetes mellitus) ([place]) 09/13/2017   Review of Systems:   General: +Occasional fatigue. Denies fevers, chills, weight loss HEENT: Denies changes in vision, headache Cardiac: Denies CP, SOB Pulmonary: Denies cough, wheezing Abd: Denies vomiting, abdominal pain, changes in bowels Extremities: Denies weakness or swelling  Physical Exam: General: Alert, in no acute distress. Pleasant and conversant. Translator present.  HEENT: No icterus, injection or ptosis. No hoarseness or dysarthria  Cardiac: RRR, no MGR appreciated Pulmonary: CTA BL with normal WOB on RA. Able to speak in complete sentences Abd: Soft, non-tender. +bs Extremities: Warm, perfused. No significant pedal edema.   Vitals:   03/30/18 1423 03/30/18 1451 03/30/18 1452  BP: (!) 154/89 (!) 163/116 (!) 161/92  Pulse: 88 90 88  Temp: 97.9 F (36.6 C)    TempSrc: Oral    SpO2: 99% 99% 97%  Weight: 179 lb 9.6 oz (81.5 kg)    Height: 5\' 4"  (1.626 m)     Body mass index is 30.83 kg/m.  Assessment & Plan:   See Encounters Tab for problem based charting.  Patient discussed with Dr. Rebeca Alert

## 2018-03-30 NOTE — Assessment & Plan Note (Addendum)
Assessment & Plan: Patient with long history of unexplained hypokalemia dating back to 2013. Her potassium was 2.8 at her most recent visit and was given an Rx for K-dur 41mEq x2 days, then 34mEq daily. Patient states she has been "mostly" compliant with this medication.  We discussed addition of a blood pressure agent which would also have a positive effect on her potassium. I will check a BMET today, and start Arlyce Harman as above.  -Follow-up BMET today -Have also placed a standing order for patient to repeat BMET in 2 weeks -- she only requires a LAB VISIT, unless of course she has issues she would like to discuss

## 2018-03-30 NOTE — Patient Instructions (Addendum)
It was great meeting you today!   I'm sorry you are not tolerating Metformin. You can try taking 1/2 tablet per day, but it is OK to hold that medication until you are back in the [place]. I do need you to be mindful of your diet and also work on weight loss. This can sometimes be hard when you're traveling, but I don't want to start a new medication that could have worse side effects just before you're about to leave for several months.    Today we talked about your blood pressure. Please continue taking Lisinopril 10mg  and Amlodipine 10mg  daily. I am prescribing an additional medication called Spironolactone. You will take 1/2 tablet daily. This medication also helps keep your potassium up.    I am going to check your potassium level today, and I would like you to return to clinic for a LAB VISIT in 2 weeks to check your potassium again. YOU DO NOT NEED TO SEE THE PHYSICIAN UNLESS YOU HAVE A COMPLAINT.    I have tried to provide a 6-month supply of your medications!

## 2018-03-31 LAB — BMP8+ANION GAP
Anion Gap: 17 mmol/L (ref 10.0–18.0)
BUN/Creatinine Ratio: 17 (ref 9–23)
BUN: 12 mg/dL (ref 6–24)
CO2: 28 mmol/L (ref 20–29)
Calcium: 9 mg/dL (ref 8.7–10.2)
Chloride: 93 mmol/L — ABNORMAL LOW (ref 96–106)
Creatinine, Ser: 0.7 mg/dL (ref 0.57–1.00)
GFR calc Af Amer: 117 mL/min/{1.73_m2} (ref >59)
GFR calc non Af Amer: 101 mL/min/{1.73_m2} (ref >59)
Glucose: 261 mg/dL — ABNORMAL HIGH (ref 65–99)
Potassium: 3.6 mmol/L (ref 3.5–5.2)
Sodium: 138 mmol/L (ref 134–144)

## 2018-04-09 NOTE — Progress Notes (Signed)
Internal Medicine Clinic Attending  Case discussed with Dr. Danford Bad  at the time of the visit.  We reviewed the resident's history and exam and pertinent patient test results.  I agree with the assessment, diagnosis, and plan of care documented in the resident's note.  In addition to the interventions described by Dr. Danford Bad, would benefit from travel vaccinations (HepA and typhoid in addition to routine vaccinations) and malaria prophylaxis. Cost may be prohibitive.   Oda Kilts, MD

## 2018-04-13 ENCOUNTER — Other Ambulatory Visit (INDEPENDENT_AMBULATORY_CARE_PROVIDER_SITE_OTHER): Payer: Self-pay

## 2018-04-13 DIAGNOSIS — E876 Hypokalemia: Secondary | ICD-10-CM

## 2018-04-14 LAB — BMP8+ANION GAP
Anion Gap: 16 mmol/L (ref 10.0–18.0)
BUN/Creatinine Ratio: 16 (ref 9–23)
BUN: 10 mg/dL (ref 6–24)
CO2: 22 mmol/L (ref 20–29)
Calcium: 8.8 mg/dL (ref 8.7–10.2)
Chloride: 100 mmol/L (ref 96–106)
Creatinine, Ser: 0.64 mg/dL (ref 0.57–1.00)
GFR calc Af Amer: 120 mL/min/{1.73_m2} (ref >59)
GFR calc non Af Amer: 104 mL/min/{1.73_m2} (ref >59)
Glucose: 203 mg/dL — ABNORMAL HIGH (ref 65–99)
Potassium: 3.7 mmol/L (ref 3.5–5.2)
Sodium: 138 mmol/L (ref 134–144)

## 2018-04-17 ENCOUNTER — Other Ambulatory Visit: Payer: Self-pay | Admitting: Obstetrics and Gynecology

## 2018-04-17 DIAGNOSIS — Z1231 Encounter for screening mammogram for malignant neoplasm of breast: Secondary | ICD-10-CM

## 2018-04-21 LAB — HM DIABETES EYE EXAM

## 2018-05-04 ENCOUNTER — Encounter: Payer: Self-pay | Admitting: *Deleted

## 2018-05-18 ENCOUNTER — Ambulatory Visit (HOSPITAL_COMMUNITY): Payer: Self-pay

## 2018-08-17 ENCOUNTER — Other Ambulatory Visit: Payer: Self-pay | Admitting: Internal Medicine

## 2018-08-17 ENCOUNTER — Telehealth: Payer: Self-pay

## 2018-08-17 DIAGNOSIS — E118 Type 2 diabetes mellitus with unspecified complications: Secondary | ICD-10-CM

## 2018-08-17 NOTE — Telephone Encounter (Signed)
This medicine was stopped in April so will not refill at this time. Thank you! Per Dr Jari Favre   Called pt - no VM, unable to leave message.

## 2018-08-17 NOTE — Telephone Encounter (Signed)
Requesting a refill on metformin, at [facility] on [address].

## 2018-08-17 NOTE — Telephone Encounter (Signed)
Not on current med list.

## 2018-08-22 ENCOUNTER — Other Ambulatory Visit: Payer: Self-pay

## 2018-08-22 ENCOUNTER — Ambulatory Visit (INDEPENDENT_AMBULATORY_CARE_PROVIDER_SITE_OTHER): Payer: Self-pay | Admitting: Internal Medicine

## 2018-08-22 VITALS — BP 138/83 | HR 74 | Temp 98.8°F | Wt 165.7 lb

## 2018-08-22 DIAGNOSIS — I10 Essential (primary) hypertension: Secondary | ICD-10-CM

## 2018-08-22 DIAGNOSIS — Z79899 Other long term (current) drug therapy: Secondary | ICD-10-CM

## 2018-08-22 DIAGNOSIS — E119 Type 2 diabetes mellitus without complications: Secondary | ICD-10-CM

## 2018-08-22 DIAGNOSIS — Z7984 Long term (current) use of oral hypoglycemic drugs: Secondary | ICD-10-CM

## 2018-08-22 DIAGNOSIS — Z0000 Encounter for general adult medical examination without abnormal findings: Secondary | ICD-10-CM

## 2018-08-22 LAB — POCT GLYCOSYLATED HEMOGLOBIN (HGB A1C): Hemoglobin A1C: 7 % — AB (ref 4.0–5.6)

## 2018-08-22 LAB — GLUCOSE, CAPILLARY: Glucose-Capillary: 141 mg/dL — ABNORMAL HIGH (ref 70–99)

## 2018-08-22 MED ORDER — LISINOPRIL 10 MG PO TABS
10.0000 mg | ORAL_TABLET | Freq: Every day | ORAL | 0 refills | Status: DC
Start: 2018-08-22 — End: 2018-10-25

## 2018-08-22 MED ORDER — METFORMIN HCL 500 MG PO TABS: 500.0000 mg | ORAL_TABLET | Freq: Two times a day (BID) | ORAL | 3 refills | Status: DC

## 2018-08-22 MED ORDER — AMLODIPINE BESYLATE 10 MG PO TABS: 10.0000 mg | ORAL_TABLET | Freq: Every day | ORAL | 3 refills | Status: DC

## 2018-08-22 MED ORDER — DULOXETINE HCL 30 MG PO CPEP: 30.0000 mg | ORAL_CAPSULE | Freq: Every day | ORAL | 2 refills | Status: DC

## 2018-08-22 NOTE — Assessment & Plan Note (Signed)
Patient endorses compliance with metformin 500mg  daily; ?had hypoglycemic episodes at night when taking BID w/o meal at night. She has overall lost about 10lbs since diagnosis of DM in Sept 2018.   Plan: --A1c today 7.0 - improved --increase metformin to 500mg  BID to make sure she takes with meals; she is to call if she has return of ?hypoglycemic symptoms --continue diet changes and increased walking --f/u in 3 months

## 2018-08-22 NOTE — Patient Instructions (Signed)
For your diabetes: Your A1c is 7.0; our goal is to get it under 7. Increase the metformin to 500mg  twice daily with meals. Continue the great job of increasing your activity and watching how much sugar and carbs you're eating.  For your blood pressure: Continue taking amlodipine 10mg  daily, lisinopril 10mg  daily and spironolactone 12.5mg  daily.  We'll see you in about 3 months.

## 2018-08-22 NOTE — Assessment & Plan Note (Signed)
Flu shot administered today. 

## 2018-08-22 NOTE — Progress Notes (Signed)
   CC: diabetes  HPI:  Ashley Huynh is a 52 y.o. with a PMH of T2DM, HTN presenting to clinic for follow up on DM and HTN.  HTN: Patient endorses compliance with amlodipine 10mg  daily, lisinopril 10mg  daily, and spironolactone 12.5mg  daily. She denies chest pain, shortness of breath.  T2DM: Patient had metformin stopped in April due to possible hypoglycemic episodes in the middle of the night (felt weak, sweaty) which resolve after cutting down on the metformin. She states she would inconsistently take the metformin with meals at that time. She has been taking 500mg  daily w/o these symptoms. She ran out of metformin while on her trip to [place] and has been taking metformin which she got there; she feels she has fluctuations in her glucose which present as episodes of feeling tired with polyuria and polydipsia.   Please see problem based Assessment and Plan for status of patients chronic conditions.  Past Medical History:  Diagnosis Date  . Essential tremor 04/24/2013  . Hypertension   . Hypokalemia   . Migraine 12/31/2013  . T2DM (type 2 diabetes mellitus) ([place]) 09/13/2017    Review of Systems:   Denies chest pain, shortness of breath, LE swelling, nausea, vomiting, diarrhea, constipation.  Physical Exam:  Vitals:   08/22/18 1530 08/22/18 1618  BP: (!) 162/93 138/83  Pulse: 87 74  Temp: 98.8 F (37.1 C)   TempSrc: Oral   SpO2: 100%   Weight: 165 lb 11.2 oz (75.2 kg)    GENERAL- alert, co-operative, appears as stated age, not in any distress. HEENT- oral mucosa appears moist. CARDIAC- RRR RESP- Moving equal volumes of air, and clear to auscultation bilaterally, no wheezes or crackles. ABDOMEN- Soft, nontender, bowel sounds present. EXTREMITIES- pulse 2+, symmetric, no pedal edema. SKIN- Warm, dry, no rash or lesion. PSYCH- Normal mood and affect, appropriate thought content and speech.  Assessment & Plan:   See Encounters Tab for problem based charting.   Patient  discussed with Dr. Abelardo Diesel, MD Internal Medicine PGY-3

## 2018-08-22 NOTE — Assessment & Plan Note (Signed)
BP well controlled on repeat measurement; patient asymptomatic. She had repeat Bmet after starting spironolactone which revealed stable renal function and potassium.  Plan: --continue lisinopril 10mg  daily, amlodipine 10mg  daily, and spironolactone 12.5mg  daily --f/u 3 months

## 2018-08-23 NOTE — Progress Notes (Signed)
Internal Medicine Clinic Attending  Case discussed with Dr. Svalina  at the time of the visit.  We reviewed the resident's history and exam and pertinent patient test results.  I agree with the assessment, diagnosis, and plan of care documented in the resident's note.  

## 2018-10-25 ENCOUNTER — Ambulatory Visit (INDEPENDENT_AMBULATORY_CARE_PROVIDER_SITE_OTHER): Payer: Self-pay | Admitting: Internal Medicine

## 2018-10-25 ENCOUNTER — Other Ambulatory Visit: Payer: Self-pay

## 2018-10-25 ENCOUNTER — Encounter: Payer: Self-pay | Admitting: Internal Medicine

## 2018-10-25 VITALS — BP 137/90 | HR 81 | Temp 97.8°F | Ht 64.0 in | Wt 172.2 lb

## 2018-10-25 DIAGNOSIS — Z7984 Long term (current) use of oral hypoglycemic drugs: Secondary | ICD-10-CM

## 2018-10-25 DIAGNOSIS — Z0000 Encounter for general adult medical examination without abnormal findings: Secondary | ICD-10-CM

## 2018-10-25 DIAGNOSIS — R0982 Postnasal drip: Secondary | ICD-10-CM

## 2018-10-25 DIAGNOSIS — Z23 Encounter for immunization: Secondary | ICD-10-CM

## 2018-10-25 DIAGNOSIS — E119 Type 2 diabetes mellitus without complications: Secondary | ICD-10-CM

## 2018-10-25 DIAGNOSIS — Z79899 Other long term (current) drug therapy: Secondary | ICD-10-CM

## 2018-10-25 DIAGNOSIS — I10 Essential (primary) hypertension: Secondary | ICD-10-CM

## 2018-10-25 LAB — POCT GLYCOSYLATED HEMOGLOBIN (HGB A1C): HEMOGLOBIN A1C: 7.2 % — AB (ref 4.0–5.6)

## 2018-10-25 LAB — GLUCOSE, CAPILLARY: GLUCOSE-CAPILLARY: 128 mg/dL — AB (ref 70–99)

## 2018-10-25 MED ORDER — FLUTICASONE PROPIONATE 50 MCG/ACT NA SUSP: 2.0000 | Freq: Every day | NASAL | 0 refills | Status: DC

## 2018-10-25 MED ORDER — SPIRONOLACTONE 25 MG PO TABS: 12.5000 mg | ORAL_TABLET | Freq: Every day | ORAL | 1 refills | Status: DC

## 2018-10-25 MED ORDER — LISINOPRIL 10 MG PO TABS: 10.0000 mg | ORAL_TABLET | Freq: Every day | ORAL | 3 refills | Status: DC

## 2018-10-25 MED ORDER — METFORMIN HCL 500 MG PO TABS: 500.0000 mg | ORAL_TABLET | Freq: Two times a day (BID) | ORAL | 11 refills | Status: DC

## 2018-10-25 MED ORDER — AMLODIPINE BESYLATE 10 MG PO TABS: 10.0000 mg | ORAL_TABLET | Freq: Every day | ORAL | 3 refills | Status: DC

## 2018-10-25 NOTE — Assessment & Plan Note (Signed)
Well controlled, 137/90. Last BMP 6 months ago WNL. Sent refills to pharmacy. -- Continue amlodipine 10 mg -- Continue lisinopril 10 mg -- Continue spironolactone 12.5 mg daily  -- Follow up 3 months

## 2018-10-25 NOTE — Patient Instructions (Signed)
Ashley Huynh,  It was a pleasure to meet you. I have sent refills of your medicine to your pharmacy. Please continue to take everything as previously prescribed. You will be called to schedule your appointment with our diabetic dietitian. Follow up with your primary care doctor again in 3 months. If you have any questions or concerns, call our clinic at [phone] or after hours call [phone] and ask for the internal medicine resident on call. Thank you!  Dr. Philipp Ovens

## 2018-10-25 NOTE — Progress Notes (Signed)
   CC: HTN, DM follow up  HPI:  [name] is a 52 y.o. female with past medical history outlined below here for HTN and DM follow up. For the details of today's visit, please refer to the assessment and plan.  Past Medical History:  Diagnosis Date  . Essential tremor 04/24/2013  . Hypertension   . Hypokalemia   . Migraine 12/31/2013  . T2DM (type 2 diabetes mellitus) ([place]) 09/13/2017    Review of Systems  HENT: Positive for congestion and sore throat.     Physical Exam:  Vitals:   10/25/18 1357  BP: 137/90  Pulse: 81  Temp: 97.8 F (36.6 C)  TempSrc: Oral  SpO2: 98%  Weight: 172 lb 3.2 oz (78.1 kg)  Height: 5\' 4"  (1.626 m)    Constitutional: NAD, appears comfortable Cardiovascular: RRR, no murmurs, rubs, or gallops.  Pulmonary/Chest: CTAB, no wheezes, rales, or rhonchi.  Extremities: Warm and well perfused.No edema.  Psychiatric: Normal mood and affect  Assessment & Plan:   See Encounters Tab for problem based charting.  Patient discussed with Dr. Daryll Drown

## 2018-10-25 NOTE — Assessment & Plan Note (Signed)
Patient is complaining of sore throat and sinus drainage x 3 days. Symptoms are most bothersome at night. No fevers, no sick contacts. Advised that symptoms may be URI and will hopefully resolve over the next few days.  -- Flonase PRN

## 2018-10-25 NOTE — Assessment & Plan Note (Signed)
Patient is here for diabetes follow up. A1c is unchanged, 7.2 from 7.0 two months ago. She is taking metformin 500 mg BID. Denies symptoms of hypoglycemia (previously reported possible episodes). Today we discussed dietary and lifestyle habits. She eats rice daily and bread frequently. Patient speaks english as her second language and there is somewhat of a language barrier. She did not know that carbohydrates raised blood sugar. We discussed limiting rice and bread; she is agreeable to meeting with our diabetic dietitian.  -- MNT referral -- Continue metformin 500 mg BID -- Follow up PCP in 3 months

## 2018-10-25 NOTE — Progress Notes (Deleted)
   CC: ***  HPI:  [name] is a 52 y.o. with a PMH of ***  Please see problem based Assessment and Plan for status of patients chronic conditions.  Past Medical History:  Diagnosis Date  . Essential tremor 04/24/2013  . Hypertension   . Hypokalemia   . Migraine 12/31/2013  . T2DM (type 2 diabetes mellitus) ([place]) 09/13/2017    Review of Systems:   ***  Physical Exam:  There were no vitals filed for this visit. GENERAL- alert, co-operative, appears as stated age, not in any distress. HEENT- Atraumatic, normocephalic, PERRL, EOMI, oral mucosa appears moist. CARDIAC- RRR, no murmurs, rubs or gallops. RESP- Moving equal volumes of air, and clear to auscultation bilaterally, no wheezes or crackles. ABDOMEN- Soft, nontender, bowel sounds present. NEURO- CN 2-12 grossly intact. EXTREMITIES- pulse 2+, symmetric, no pedal edema. SKIN- Warm, dry, no rash or lesion. PSYCH- Normal mood and affect, appropriate thought content and speech.  Assessment & Plan:   See Encounters Tab for problem based charting.   Patient {GC/GE:[id number]::"discussed with","seen with"} Dr. {NAMES:[id number]::"Butcher","Granfortuna","E. Hoffman","Klima","Mullen","Narendra","Raines","Vincent"}   Alphonzo Grieve, MD Internal Medicine PGY-3

## 2018-10-25 NOTE — Assessment & Plan Note (Signed)
Received flu shot today. Provided with fecal occult cards for colon cancer screening.

## 2018-10-26 ENCOUNTER — Encounter: Payer: Self-pay | Admitting: Internal Medicine

## 2018-10-27 NOTE — Progress Notes (Signed)
Internal Medicine Clinic Attending  Case discussed with Dr. Guilloud at the time of the visit.  We reviewed the resident's history and exam and pertinent patient test results.  I agree with the assessment, diagnosis, and plan of care documented in the resident's note.  

## 2018-11-01 ENCOUNTER — Ambulatory Visit: Payer: Self-pay

## 2018-11-20 ENCOUNTER — Encounter: Payer: Self-pay | Admitting: Dietician

## 2018-11-20 ENCOUNTER — Encounter: Payer: Self-pay | Admitting: Internal Medicine

## 2018-11-20 ENCOUNTER — Telehealth (HOSPITAL_COMMUNITY): Payer: Self-pay | Admitting: Obstetrics and Gynecology

## 2018-11-20 NOTE — Telephone Encounter (Signed)
Called patient regarding rescheduling screening mammogram.  Phone number would not go through.

## 2018-11-27 NOTE — Addendum Note (Signed)
Addended by: Hulan Fray on: 11/27/2018 05:59 PM   Modules accepted: Orders

## 2019-01-01 ENCOUNTER — Encounter: Payer: Self-pay | Admitting: Internal Medicine

## 2019-05-31 ENCOUNTER — Encounter: Payer: Self-pay | Admitting: *Deleted

## 2019-07-23 ENCOUNTER — Ambulatory Visit (INDEPENDENT_AMBULATORY_CARE_PROVIDER_SITE_OTHER): Payer: Self-pay | Admitting: Internal Medicine

## 2019-07-23 ENCOUNTER — Encounter (INDEPENDENT_AMBULATORY_CARE_PROVIDER_SITE_OTHER): Payer: Self-pay

## 2019-07-23 ENCOUNTER — Encounter: Payer: Self-pay | Admitting: Internal Medicine

## 2019-07-23 ENCOUNTER — Other Ambulatory Visit: Payer: Self-pay

## 2019-07-23 VITALS — BP 141/97 | HR 80 | Temp 98.3°F | Ht 64.0 in | Wt 169.5 lb

## 2019-07-23 DIAGNOSIS — L299 Pruritus, unspecified: Secondary | ICD-10-CM

## 2019-07-23 NOTE — Patient Instructions (Signed)
It was a pleasure to see you today. Here is a summary of the issues we discussed:  1. Itchiness (Diffuse Pruritis):  - Continue washing you body with mild soaps like Dove. Apply unscented lotion over you entire body immediately after showering and before bed each night.   - Take over the counter Zyrtec (Cetirizine), one 10 mg pill by mouth in the morning each day.   - Take over the counter Benadryl (Diphenhydramine) one 25 mg pill every night before bed.   - Apply Calamine lotion to areas that are itchy as needed.    If you symptoms do no resolve during the next week, call to make a follow up appointment.

## 2019-07-23 NOTE — Assessment & Plan Note (Addendum)
Presented with a 1 week history of generalized pruritis without skin eruption that is worse at night. She denies recent change in soap, detergent, or medications. She works from home and denies contact with any caustic material. States that she washes her hands frequently throughout the day. She had a previous episode of pruritis about 10 years ago that resolved after being prescribed an unknown medication. Her recent lab work shows no sign of renal impairment, but due to lack of insurance we will hold off on performing a lipid panel and TSH until approved for Medicaid.   Plan:  - Counseled the patient to wash with unscented, mild soap like Dove, use a body moisturizer immediately after showering and before bed, and limit washing with hot water.  - Told her to take OTC Cetirizine each morning and Diphenhydramine at night.  - Use calamine lotion as need for pruritis. - Instructed her to make a follow up appointment in 1 week if her symptoms do not resolve.

## 2019-07-23 NOTE — Progress Notes (Signed)
   CC: Pruritis  HPI:  Ashley Huynh is a 53 y.o. female with a past medical history stated below and presented with a 1 week history of diffuse pruritis. See Problem Specific documentation for more information.  Past Medical History:  Diagnosis Date  . Essential tremor 04/24/2013  . Hypertension   . Hypokalemia   . Migraine 12/31/2013  . T2DM (type 2 diabetes mellitus) ([place]) 09/13/2017   Review of Systems: Review of Systems  Constitutional: Negative for chills and fever.  HENT: Negative for congestion, nosebleeds and sinus pain.   Eyes: Positive for redness. Negative for pain and discharge.  Gastrointestinal: Negative for abdominal pain.  Skin: Positive for itching (diffuse pruritis). Negative for rash.     Physical Exam:  Vitals:   07/23/19 0924  BP: (!) 141/97  Pulse: 80  Temp: 98.3 F (36.8 C)  TempSrc: Oral  SpO2: 99%  Weight: 169 lb 8 oz (76.9 kg)  Height: 5\' 4"  (1.626 m)   Physical Exam  Constitutional: She is well-developed, well-nourished, and in no distress.  Eyes: Right eye exhibits no discharge. Left eye exhibits no discharge. Right conjunctiva is injected. Left conjunctiva is injected. No scleral icterus.  Cardiovascular: Normal rate.  Pulmonary/Chest: Effort normal and breath sounds normal. No respiratory distress.  Abdominal: There is no abdominal tenderness.  Skin: Skin is warm, dry and intact. No rash noted. No erythema.     Assessment & Plan:   See Encounters Tab for problem based charting.  Patient seen with Dr. Dareen Piano

## 2019-07-24 ENCOUNTER — Ambulatory Visit: Payer: Self-pay

## 2019-07-24 NOTE — Progress Notes (Signed)
Internal Medicine Clinic Attending  I saw and evaluated the patient.  I personally confirmed the key portions of the history and exam documented by Dr. Coe and I reviewed pertinent patient test results.  The assessment, diagnosis, and plan were formulated together and I agree with the documentation in the resident's note.    

## 2020-02-12 ENCOUNTER — Encounter: Payer: Self-pay | Admitting: Internal Medicine

## 2020-03-17 ENCOUNTER — Other Ambulatory Visit: Payer: Self-pay | Admitting: Internal Medicine

## 2020-03-17 DIAGNOSIS — E119 Type 2 diabetes mellitus without complications: Secondary | ICD-10-CM

## 2020-03-17 DIAGNOSIS — I10 Essential (primary) hypertension: Secondary | ICD-10-CM

## 2020-03-17 MED ORDER — AMLODIPINE BESYLATE 10 MG PO TABS: 10.0000 mg | ORAL_TABLET | Freq: Every day | ORAL | 3 refills | Status: DC

## 2020-03-17 MED ORDER — METFORMIN HCL 500 MG PO TABS: 500.0000 mg | ORAL_TABLET | Freq: Two times a day (BID) | ORAL | 11 refills | Status: DC

## 2020-03-17 MED ORDER — LISINOPRIL 10 MG PO TABS: 10.0000 mg | ORAL_TABLET | Freq: Every day | ORAL | 3 refills | Status: DC

## 2020-03-17 NOTE — Telephone Encounter (Signed)
REFILL REQUEST  Per patient she has been out of her meds since Friday.  If any questions please call patient phone number on file is correct.  amLODipine (NORVASC) 10 MG tablet  lisinopril (PRINIVIL,ZESTRIL) 10 MG tablet(Expired)   metFORMIN (GLUCOPHAGE) 500 MG tablet(Expired)   [place], [place]. [phone] (Phone) [phone] (Fax)

## 2020-08-04 ENCOUNTER — Ambulatory Visit (INDEPENDENT_AMBULATORY_CARE_PROVIDER_SITE_OTHER): Payer: Self-pay | Admitting: Internal Medicine

## 2020-08-04 ENCOUNTER — Telehealth: Payer: Self-pay

## 2020-08-04 DIAGNOSIS — L259 Unspecified contact dermatitis, unspecified cause: Secondary | ICD-10-CM | POA: Insufficient documentation

## 2020-08-04 MED ORDER — TRIAMCINOLONE ACETONIDE 0.025 % EX OINT: 1.0000 "application " | TOPICAL_OINTMENT | Freq: Two times a day (BID) | CUTANEOUS | 0 refills | Status: DC

## 2020-08-04 NOTE — Telephone Encounter (Signed)
Please call pt back rash.

## 2020-08-04 NOTE — Assessment & Plan Note (Addendum)
Ashley Huynh states that approximately 3 days ago, she tried on a silk hijab and noticed immediate itchiness and redness around her neck. This is a hijab she tried on previously and had a similar reaction, so she waited a few months before trying it on again. She notes persistent redness and itchness that has mildly improved with application of coconut oil and shea oil. She also tried Benadryl with some response. She denies any swelling of her interior neck, dysphagia, or SOB.   Assessment/Plan:  History and examination consistent with contact dermatitis. Will sent in script for Kenalog ointment to be applied BID.   - Kenalog ointment 0.025% BID x 5 days  - Follow up in 5 days if no improvement

## 2020-08-04 NOTE — Telephone Encounter (Signed)
Returned call to patient [address] [place] [address] and Ahmed ID [phone]. States rash on neck and chest from new apparel x 3 days.c/o redness and itchiness. Has taken benadryl and applied triple antibiotic cream to affected area. Cream cools it down for a little while then itchiness and sometimes pain returns. Given appt today at 3:45 with PCP. Hubbard Hartshorn, BSN, RN-BC

## 2020-08-04 NOTE — Patient Instructions (Signed)
It was nice seeing you today! Thank you for choosing [facility] for your Primary Care.    Today we talked about:   1. Rash: Apply ointment to the neck area twice a day for 5 days or until rash resolves. If it does not get better in 5 days, please call the clinic back.    ---------------------------------------------------      !   [facility]  .     :   :         5      .      5       .  kan min allatif ruyatuk alyawma! shkran liakhtiarik [facility] lirieayatik al'awaliati.  tahadathna alyawm ean: altafh aljildi: dae mrhman ealaa mintaqat alraqabat maratayn ywmyan limudat 5 'ayaam 'aw hataa yazul altafh aljildi. 'iidha lam tatahasan alhalat khilal 5 'ayaam , yurjaa alaitisal bialeiadat Jerold Coombe 'ukhraa

## 2020-08-04 NOTE — Progress Notes (Signed)
   CC: Rash  HPI:  [name] is a 54 y.o. with a PMHx as listed below who presents to the clinic for rash.   Please see the Encounters tab for problem-based Assessment & Plan regarding status of patient's acute and chronic conditions.  Past Medical History:  Diagnosis Date  . Essential tremor 04/24/2013  . Hypertension   . Hypokalemia   . Migraine 12/31/2013  . T2DM (type 2 diabetes mellitus) ([place]) 09/13/2017   Review of Systems: Review of Systems  Constitutional: Negative for chills and fever.  HENT: Negative for congestion.        No neck swelling, no dysphagia   Respiratory: Negative for cough, shortness of breath and wheezing.   Skin: Positive for itching and rash.   Physical Exam:  Vitals:   08/04/20 1630  BP: (!) 158/105  Pulse: 74  Temp: 98.4 F (36.9 C)  TempSrc: Oral  SpO2: 99%  Weight: 171 lb 6.4 oz (77.7 kg)   Physical Exam Vitals and nursing note reviewed.  Constitutional:      Appearance: She is normal weight.  Musculoskeletal:     Cervical back: Normal range of motion and neck supple. No tenderness.  Lymphadenopathy:     Cervical: No cervical adenopathy.  Skin:    General: Skin is warm and dry.     Comments: Erythematous rash extending fully around base of neck and that is irregular with clear filled vesicles spread throughout in clusters. No TPP.   Neurological:     General: No focal deficit present.     Mental Status: She is alert and oriented to person, place, and time. Mental status is at baseline.    Assessment & Plan:   See Encounters Tab for problem based charting.  Patient discussed with Dr. Philipp Ovens

## 2020-08-05 NOTE — Progress Notes (Signed)
Internal Medicine Clinic Attending  Case discussed with Dr. Basaraba  At the time of the visit.  We reviewed the resident's history and exam and pertinent patient test results.  I agree with the assessment, diagnosis, and plan of care documented in the resident's note.  

## 2021-01-26 ENCOUNTER — Ambulatory Visit (INDEPENDENT_AMBULATORY_CARE_PROVIDER_SITE_OTHER): Payer: 59 | Admitting: Student

## 2021-01-26 VITALS — BP 160/98 | HR 80 | Wt 172.3 lb

## 2021-01-26 DIAGNOSIS — Z1231 Encounter for screening mammogram for malignant neoplasm of breast: Secondary | ICD-10-CM | POA: Diagnosis not present

## 2021-01-26 DIAGNOSIS — Z23 Encounter for immunization: Secondary | ICD-10-CM

## 2021-01-26 DIAGNOSIS — E11621 Type 2 diabetes mellitus with foot ulcer: Secondary | ICD-10-CM | POA: Insufficient documentation

## 2021-01-26 DIAGNOSIS — Z1211 Encounter for screening for malignant neoplasm of colon: Secondary | ICD-10-CM

## 2021-01-26 DIAGNOSIS — E119 Type 2 diabetes mellitus without complications: Secondary | ICD-10-CM | POA: Diagnosis not present

## 2021-01-26 DIAGNOSIS — Z0000 Encounter for general adult medical examination without abnormal findings: Secondary | ICD-10-CM

## 2021-01-26 DIAGNOSIS — I10 Essential (primary) hypertension: Secondary | ICD-10-CM

## 2021-01-26 DIAGNOSIS — L97529 Non-pressure chronic ulcer of other part of left foot with unspecified severity: Secondary | ICD-10-CM

## 2021-01-26 HISTORY — DX: Type 2 diabetes mellitus with foot ulcer: E11.621

## 2021-01-26 LAB — POCT GLYCOSYLATED HEMOGLOBIN (HGB A1C): Hemoglobin A1C: 10 % — AB (ref 4.0–5.6)

## 2021-01-26 LAB — GLUCOSE, CAPILLARY: Glucose-Capillary: 230 mg/dL — ABNORMAL HIGH (ref 70–99)

## 2021-01-26 MED ORDER — LISINOPRIL 10 MG PO TABS: 10.0000 mg | ORAL_TABLET | Freq: Every day | ORAL | 3 refills | Status: DC

## 2021-01-26 MED ORDER — DOXYCYCLINE MONOHYDRATE 100 MG PO CAPS: 100.0000 mg | ORAL_CAPSULE | Freq: Two times a day (BID) | ORAL | 0 refills | Status: DC

## 2021-01-26 MED ORDER — AMLODIPINE BESYLATE 10 MG PO TABS: 10.0000 mg | ORAL_TABLET | Freq: Every day | ORAL | 3 refills | Status: DC

## 2021-01-26 MED ORDER — METFORMIN HCL ER 500 MG PO TB24: 1000.0000 mg | ORAL_TABLET | Freq: Two times a day (BID) | ORAL | 5 refills | Status: DC

## 2021-01-26 NOTE — Progress Notes (Signed)
   CC: cut on foot with smell  HPI:  [name] is a 55 y.o. female with history of HTN, T2DM, migraines presenting to the [facility] for a cut on her left foot with an associated smell. Please see individualized A&P for full HPI.  Past Medical History:  Diagnosis Date  . Essential tremor 04/24/2013  . Hypertension   . Hypokalemia   . Migraine 12/31/2013  . T2DM (type 2 diabetes mellitus) ([place]) 09/13/2017   Review of Systems:  Negative aside from that listed in individualized A&P  Physical Exam:  Vitals:   01/26/21 0853  BP: (!) 160/98  Pulse: 80  SpO2: 98%  Weight: 172 lb 4.8 oz (78.2 kg)   Physical Exam Constitutional:      Appearance: She is obese. She is not ill-appearing.  HENT:     Head: Normocephalic and atraumatic.     Mouth/Throat:     Mouth: Mucous membranes are moist.     Pharynx: Oropharynx is clear.  Eyes:     Extraocular Movements: Extraocular movements intact.     Conjunctiva/sclera: Conjunctivae normal.     Pupils: Pupils are equal, round, and reactive to light.  Cardiovascular:     Rate and Rhythm: Normal rate and regular rhythm.     Pulses: Normal pulses.     Heart sounds: Normal heart sounds. No murmur heard. No friction rub. No gallop.   Pulmonary:     Effort: Pulmonary effort is normal.     Breath sounds: Normal breath sounds. No wheezing, rhonchi or rales.  Abdominal:     General: Bowel sounds are normal. There is no distension.     Palpations: Abdomen is soft.     Tenderness: There is no abdominal tenderness.  Musculoskeletal:        General: No swelling. Normal range of motion.     Cervical back: Normal range of motion.  Skin:    General: Skin is warm and dry.     Comments: Lesion on left foot between 4th and 5th digits. No erythema or warmth noted around wound. Lack of sensation on plantar surface just below 2nd and 3rd digits of left foot. TTP on lateral surface of foot close to digits.  Neurological:     General: No focal deficit  present.     Mental Status: She is alert and oriented to person, place, and time.  Psychiatric:        Mood and Affect: Mood normal.        Behavior: Behavior normal.      Assessment & Plan:   See Encounters Tab for problem based charting.  Patient seen with Dr. Heber Collin

## 2021-01-26 NOTE — Assessment & Plan Note (Addendum)
Today's Vitals   01/26/21 0853 01/26/21 1041  BP: (!) 160/98   Pulse: 80   SpO2: 98%   Weight: 172 lb 4.8 oz (78.2 kg)   PainSc:  5    Body mass index is 29.58 kg/m.   Patient has not been regularly taking HTN meds. I have refilled them and emphasized importance of continued use. Patient was on spironolactone for hypokalemia of unclear etiology but this was prescribed 2 years ago. Will not refill at this time and will assess BP control on lisinopril and norvasc. Ordered lipid panel, patient likely needs statin therapy given HTN and T2DM, can revisit this at next visit.  -refilled norvasc 10mg , lisinopril 10mg  -f/u lipid panel

## 2021-01-26 NOTE — Assessment & Plan Note (Addendum)
Patient with history of T2DM on metformin 500mg  BID. Reports nonadherence, states Ashley Huynh takes it once some days, but otherwise does not take it. HbA1c 10.8% today. Patient also  found to have lack of sensation on plantar surface of left foot below 2nd and 3rd digits on monofilament test. Ashley Huynh is coming in with a presumed diabetic foot wound (see separate problem). Emphasized taking metformin regularly for better glycemic control. Increased metformin dose to 1000mg  BID. Patient not interested in starting another agent at this time. Can reassess this at next visit.  -metformin 1000mg  BID -repeat HbA1c in 3 months

## 2021-01-26 NOTE — Assessment & Plan Note (Signed)
Ordered GI referral for colon cancer screening and mammogram as well. Received flu shot today.

## 2021-01-26 NOTE — Patient Instructions (Signed)
Ms Ashley Huynh,  It was a pleasure seeing you in the clinic today.   We discussed the following today:  1. Please take your increased Metformin dose as prescribed. Please take it every day!  2. We are getting some labs today. I will call you if any labs are abnormal.  3. I have refilled your blood pressure medications. Please take them every day.  4. I have prescribed you an antibiotic (doxycycline). Please take 1 tablet twice daily for the next 7 days.  5. Please come back in 1 week for a follow up visit to make sure you foot wound is healing well.  Please call our clinic at [phone] if you have any questions or concerns. The best time to call is Monday-Friday from 9am-4pm, but there is someone available 24/7 at the same number. If you need medication refills, please notify your pharmacy one week in advance and they will send [place] a request.   Thank you for letting us take part in your care. We look forward to seeing you next time!

## 2021-01-26 NOTE — Assessment & Plan Note (Addendum)
Patient presenting with left foot wound between 4th and 5th digits that started 6 days ago. Reports that it was initially a blister at this location that popped and began draining green pus with associated smell. States that Ashley Huynh has been applying neosporin and has been washing it 3-4 times daily. States that it has significantly improved and there is now only intermittent mild greenish discharge and no pain. On exam, patient's foot wound is not draining pus and appears to be healing well. No redness or pain noted on exam around foot wound itself. Patient does have uncontrolled diabetes (HbA1c 10.8%) and monofilament test showed lack of sensation below 2nd and 3rd digits (plantar). Ashley Huynh does endorse infrequent use of metformin. At this time, patient's foot wound is presumed to be a diabetic foot wound given rest of history. As patient is complaining of discharge with foul smell, will prescribe 7-day course of PO doxycyline to cover for infections given uncontrolled diabetes and open lesion. Will have patient f/u in 1 week to assess healing. Will order CBC to check for infection and CMP to assess kidney function in setting of uncontrolled T2DM.  -doxycycline 100mg  BID for 7-days -f/u in 1 week -emphasized importance of compliance with metformin therapy -f/u CBC and CMP

## 2021-01-27 ENCOUNTER — Telehealth: Payer: Self-pay | Admitting: Internal Medicine

## 2021-01-27 LAB — CBC WITH DIFFERENTIAL/PLATELET
Basophils Absolute: 0 10*3/uL (ref 0.0–0.2)
Basos: 1 %
EOS (ABSOLUTE): 0.1 10*3/uL (ref 0.0–0.4)
Eos: 2 %
Hematocrit: 45.6 % (ref 34.0–46.6)
Hemoglobin: 15 g/dL (ref 11.1–15.9)
Immature Grans (Abs): 0 10*3/uL (ref 0.0–0.1)
Immature Granulocytes: 0 %
Lymphocytes Absolute: 2.2 10*3/uL (ref 0.7–3.1)
Lymphs: 42 %
MCH: 26.7 pg (ref 26.6–33.0)
MCHC: 32.9 g/dL (ref 31.5–35.7)
MCV: 81 fL (ref 79–97)
Monocytes Absolute: 0.4 10*3/uL (ref 0.1–0.9)
Monocytes: 7 %
Neutrophils Absolute: 2.6 10*3/uL (ref 1.4–7.0)
Neutrophils: 48 %
Platelets: 345 10*3/uL (ref 150–450)
RBC: 5.62 x10E6/uL — ABNORMAL HIGH (ref 3.77–5.28)
RDW: 13.9 % (ref 11.7–15.4)
WBC: 5.2 10*3/uL (ref 3.4–10.8)

## 2021-01-27 LAB — LIPID PANEL
Chol/HDL Ratio: 4.1 ratio (ref 0.0–4.4)
Cholesterol, Total: 201 mg/dL — ABNORMAL HIGH (ref 100–199)
HDL: 49 mg/dL (ref >39)
LDL Chol Calc (NIH): 128 mg/dL — ABNORMAL HIGH (ref 0–99)
Triglycerides: 137 mg/dL (ref 0–149)
VLDL Cholesterol Cal: 24 mg/dL (ref 5–40)

## 2021-01-27 LAB — CMP14 + ANION GAP
ALT: 22 IU/L (ref 0–32)
AST: 21 IU/L (ref 0–40)
Albumin/Globulin Ratio: 1.5 (ref 1.2–2.2)
Albumin: 4.5 g/dL (ref 3.8–4.9)
Alkaline Phosphatase: 77 IU/L (ref 44–121)
Anion Gap: 14 mmol/L (ref 10.0–18.0)
BUN/Creatinine Ratio: 21 (ref 9–23)
BUN: 16 mg/dL (ref 6–24)
Bilirubin Total: 0.4 mg/dL (ref 0.0–1.2)
CO2: 31 mmol/L — ABNORMAL HIGH (ref 20–29)
Calcium: 9.4 mg/dL (ref 8.7–10.2)
Chloride: 93 mmol/L — ABNORMAL LOW (ref 96–106)
Creatinine, Ser: 0.77 mg/dL (ref 0.57–1.00)
GFR calc Af Amer: 102 mL/min/{1.73_m2} (ref >59)
GFR calc non Af Amer: 88 mL/min/{1.73_m2} (ref >59)
Globulin, Total: 3 g/dL (ref 1.5–4.5)
Glucose: 224 mg/dL — ABNORMAL HIGH (ref 65–99)
Potassium: 3.3 mmol/L — ABNORMAL LOW (ref 3.5–5.2)
Sodium: 138 mmol/L (ref 134–144)
Total Protein: 7.5 g/dL (ref 6.0–8.5)

## 2021-01-27 NOTE — Telephone Encounter (Signed)
Pt had had the covid vaccine pfizer. daughter Etheleen Sia who was interpreting as pt does not speak [place]. But confirmed on her vaccine card she has had covid vaccie.

## 2021-01-28 NOTE — Progress Notes (Signed)
Internal Medicine Clinic Attending  I saw and evaluated the patient.  I personally confirmed the key portions of the history and exam documented by Dr. Allyson Sabal and I reviewed pertinent patient test results.  The assessment, diagnosis, and plan were formulated together and I agree with the documentation in the resident's note. No erythema or palpable induration, no active drainage from small wound however I am concerned that she is an uncontrolled diabetic with reports of pus drainage.  We will provide a course of doxycyline and close follow up.

## 2021-01-29 ENCOUNTER — Telehealth: Payer: Self-pay | Admitting: Student

## 2021-01-29 ENCOUNTER — Encounter: Payer: Self-pay | Admitting: Internal Medicine

## 2021-01-29 ENCOUNTER — Other Ambulatory Visit: Payer: Self-pay | Admitting: Student

## 2021-01-29 MED ORDER — POTASSIUM CHLORIDE CRYS ER 20 MEQ PO TBCR: 20.0000 meq | EXTENDED_RELEASE_TABLET | Freq: Two times a day (BID) | ORAL | 0 refills | Status: DC

## 2021-01-29 MED ORDER — ATORVASTATIN CALCIUM 10 MG PO TABS: 10.0000 mg | ORAL_TABLET | Freq: Every day | ORAL | 2 refills | Status: DC

## 2021-01-29 NOTE — Telephone Encounter (Signed)
Spoke with Ashley Huynh and her daughter (translated). Discussed lipid profile showing high total cholesterol and LDL, will start her on lipitor 10mg  daily (can titrate up as tolerated) given hx of uncontrolled T2DM and HTN. Discussed CMP showing mildly low potassium at 3.3, will supplement with PO KCl 11mEq BID for 2 days. Also discussed normal CBC findings.  Plan: -start lipitor 10mg  daily -PO KCl 1mEq BID for 2 days

## 2021-02-02 ENCOUNTER — Encounter: Payer: Self-pay | Admitting: Internal Medicine

## 2021-02-06 ENCOUNTER — Ambulatory Visit (INDEPENDENT_AMBULATORY_CARE_PROVIDER_SITE_OTHER): Payer: 59 | Admitting: Internal Medicine

## 2021-02-06 ENCOUNTER — Encounter: Payer: Self-pay | Admitting: Internal Medicine

## 2021-02-06 ENCOUNTER — Other Ambulatory Visit: Payer: Self-pay

## 2021-02-06 VITALS — BP 128/86 | HR 89 | Temp 98.4°F | Ht 62.0 in | Wt 167.2 lb

## 2021-02-06 DIAGNOSIS — B370 Candidal stomatitis: Secondary | ICD-10-CM

## 2021-02-06 DIAGNOSIS — E11621 Type 2 diabetes mellitus with foot ulcer: Secondary | ICD-10-CM | POA: Diagnosis not present

## 2021-02-06 DIAGNOSIS — L97529 Non-pressure chronic ulcer of other part of left foot with unspecified severity: Secondary | ICD-10-CM

## 2021-02-06 DIAGNOSIS — E119 Type 2 diabetes mellitus without complications: Secondary | ICD-10-CM

## 2021-02-06 MED ORDER — NYSTATIN 100000 UNIT/ML MT SUSP: 5.0000 mL | Freq: Four times a day (QID) | OROMUCOSAL | 0 refills | Status: AC

## 2021-02-06 NOTE — Patient Instructions (Signed)
Dear Ashley Huynh,  Thank you for allowing [place] to provide your care today. Today we discussed your mouth discomfort and foot ulcer    I have ordered no labs for you. I will call if any are abnormal.    Today we made the following changes to your medications:    Please take nystatin solution 4 times a day for 7 days  Please follow-up in 3 months.    Please call the internal medicine center clinic if you have any questions or concerns, we may be able to help and keep you from a long and expensive emergency room wait. Our clinic and after hours phone number is [phone], the best time to call is Monday through Friday 9 am to 4 pm but there is always someone available 24/7 if you have an emergency. If you need medication refills please notify your pharmacy one week in advance and they will send [place] a request.    If you have not gotten the COVID vaccine, I recommend doing so:  You may get it at your local [facility] or [facility] OR To schedule an appointment for a COVID vaccine or be added to the vaccine wait list: Go to [email or web address]   OR Go to [email or web address]                  OR Call [phone]                                     OR Call [phone] and select Option 2  Thank you for choosing [facility], Adult Oral thrush is an infection in your mouth and throat and on your tongue. It causes white patches to form in your mouth and on your tongue. Many cases of thrush are mild. But, sometimes, thrush can be serious. People who have a weak body defense system (immune system) or other diseases can be affected more. What are the causes? This condition is caused by a type of fungus called yeast. The fungus is normally present in small amounts in the mouth and nose. If a person has a long-term illness or a weak body defense system, the fungus can grow and spread quickly. This causes thrush. What increases the risk? You are more likely to develop this  condition if:  You have a weak body defense system.  You are an older adult.  You have diabetes, cancer, or HIV.  You have a dry mouth.  You are pregnant or breastfeeding.  You do not take good care of your teeth. This risk is greater for people who have false teeth (dentures).  You use antibiotic or steroid medicines. What are the signs or symptoms? Symptoms of this condition include:  A burning feeling in the mouth and throat.  White patches that stick to the mouth and tongue.  A bad taste in the mouth or trouble tasting foods.  A feeling like you have cotton in your mouth.  Pain when you eat and swallow.  Not wanting to eat as much as usual.  Cracking at the corners of the mouth.   How is this treated? This condition is treated with medicines called antifungals. These medicines prevent a fungus from growing. The medicines are either put right on the area (topical) or swallowed (oral). Your doctor will also treat other problems that you may have, such as diabetes or HIV. Follow these instructions at home:  Medicines  Take or use over-the-counter and prescription medicines only as told by your doctor.  Ask your doctor about an over-the-counter medicine called gentian violet. Helping with pain and soreness To lessen your pain:  Drink cold liquids, like water and iced tea.  Eat frozen ice pops or frozen juices.  Eat foods that are easy to swallow, like gelatin and ice cream.  Drink from a straw if you have too much pain in your mouth.   General instructions  Eat plain yogurt that has live cultures in it. Read the label to make sure that there are live cultures in your yogurt.  If you wear false teeth: ? Take them out before you go to bed. ? Brush them well. ? Soak them in a cleaner.  Rinse your mouth with warm salt-water many times a day. To make the salt-water mixture, dissolve -1 teaspoon (3-6 g) of salt in 1 cup (237 mL) of warm water. Contact a doctor  if:  Your problems do not get better within 7 days of treatment.  Your infection is spreading. This may show as white areas on the skin outside of your mouth.  You are nursing your baby and you have redness and pain in the nipples. Summary  Oral thrush is an infection in your mouth and throat. It is caused by a fungus.  You are more likely to get this condition if you have a weak body defense system. Diseases like diabetes, cancer, or HIV also add to your risk.  This condition is treated with medicines called antifungals.  Contact a doctor if you do not get better within 7 days of starting treatment. This information is not intended to replace advice given to you by your health care provider. Make sure you discuss any questions you have with your health care provider. Document Revised: 10/12/2019 Document Reviewed: 10/12/2019 Elsevier Patient Education  [place].

## 2021-02-06 NOTE — Progress Notes (Signed)
   CC: Diabetes  HPI: Ashley Huynh is a 55 y.o. with PMH listed below presenting with complaint of diabetes. Please see problem based assessment and plan for further details.  Past Medical History:  Diagnosis Date  . Essential tremor 04/24/2013  . Hypertension   . Hypokalemia   . Migraine 12/31/2013  . T2DM (type 2 diabetes mellitus) ([place]) 09/13/2017    Review of Systems: Review of Systems  Constitutional: Negative for chills, fever and malaise/fatigue.  Respiratory: Negative for shortness of breath.   Cardiovascular: Negative for chest pain.  Gastrointestinal: Negative for constipation, diarrhea, nausea and vomiting.  Genitourinary: Negative for dysuria.  Neurological: Positive for sensory change. Negative for dizziness and tingling.  All other systems reviewed and are negative.    Physical Exam: Vitals:   02/06/21 1000 02/06/21 1055  BP: (!) 158/97 128/86  Pulse: 89   Temp: 98.4 F (36.9 C)   TempSrc: Oral   SpO2: 98%   Weight: 167 lb 3.2 oz (75.8 kg)   Height: 5\' 2"  (1.575 m)    Gen: Well-developed, well nourished, NAD HEENT: NCAT head, hearing intact, + oral thrush CV: RRR, S1, S2 normal Pulm: CTAB, No rales, no wheezes Extm: ROM intact, Peripheral pulses intact, No peripheral edema Skin: Dry, Warm, normal turgor, well-healing ulcer between 4th and 5th toe without fluctuance, drainage     Assessment & Plan:   Oral candidiasis She presents with complaint of oral discomfort. Denies any change in taste or obvious ulcers. Does mention white color on her tongue. Also endorsing discomfort near the back of her throat. Denies fevers, chills, nausea, vomiting.  A/P Exam consistent with oral thrush likely sequelae of her uncontrolled diabetes. Will treat w/ nystatin - Nystatin po sus for 7 days  Diabetic foot ulcer associated with type 2 diabetes mellitus ([place]) Presents for f/u management of diabetic ulcer. Mentions she took her doxycycline as prescribed with  reduction in size. Denies any further episodes of purulent drainage. Denies fevers, chills, nausea, vomiting, diarrhea  A/P Exam shows well-healing ulcer. High risk for re-occurence with last hgb a1c >10. Will refer to poditary for further monitoring. No indication for additional antibiotics. - Referral to podiatry - C/w monitor  Type 2 diabetes mellitus ([place]) Lab Results  Component Value Date   HGBA1C 10.0 (A) 01/26/2021   Re-discussed importance of controlling her diabetes and need to start additional agents for diabetic control. She mentions that she is watching her diet and trying to avoid high carb foods. Offered additional therapies. She is hesitant to start additional meds but states will think about it.  - C/w metformin 1000mg  BID - F/u in 2 months    Patient discussed with Dr. Dareen Piano  -Gilberto Better, [facility] Pager: [phone]

## 2021-02-09 ENCOUNTER — Encounter: Payer: Self-pay | Admitting: *Deleted

## 2021-02-09 ENCOUNTER — Encounter: Payer: Self-pay | Admitting: Internal Medicine

## 2021-02-09 NOTE — Progress Notes (Signed)
Internal Medicine Clinic Attending  Case discussed with Dr. Lee  At the time of the visit.  We reviewed the resident's history and exam and pertinent patient test results.  I agree with the assessment, diagnosis, and plan of care documented in the resident's note.    

## 2021-02-09 NOTE — Assessment & Plan Note (Signed)
She presents with complaint of oral discomfort. Denies any change in taste or obvious ulcers. Does mention white color on her tongue. Also endorsing discomfort near the back of her throat. Denies fevers, chills, nausea, vomiting.  A/P Exam consistent with oral thrush likely sequelae of her uncontrolled diabetes. Will treat w/ nystatin - Nystatin po sus for 7 days

## 2021-02-09 NOTE — Assessment & Plan Note (Signed)
Lab Results  Component Value Date   HGBA1C 10.0 (A) 01/26/2021   Re-discussed importance of controlling her diabetes and need to start additional agents for diabetic control. She mentions that she is watching her diet and trying to avoid high carb foods. Offered additional therapies. She is hesitant to start additional meds but states will think about it.  - C/w metformin 1000mg  BID - F/u in 2 months

## 2021-02-09 NOTE — Assessment & Plan Note (Signed)
Presents for f/u management of diabetic ulcer. Mentions she took her doxycycline as prescribed with reduction in size. Denies any further episodes of purulent drainage. Denies fevers, chills, nausea, vomiting, diarrhea  A/P Exam shows well-healing ulcer. High risk for re-occurence with last hgb a1c >10. Will refer to poditary for further monitoring. No indication for additional antibiotics. - Referral to podiatry - C/w monitor

## 2021-02-10 ENCOUNTER — Encounter: Payer: Self-pay | Admitting: Gastroenterology

## 2021-02-16 ENCOUNTER — Ambulatory Visit (INDEPENDENT_AMBULATORY_CARE_PROVIDER_SITE_OTHER): Payer: 59 | Admitting: Podiatry

## 2021-02-16 ENCOUNTER — Other Ambulatory Visit: Payer: Self-pay

## 2021-02-16 ENCOUNTER — Ambulatory Visit (INDEPENDENT_AMBULATORY_CARE_PROVIDER_SITE_OTHER): Payer: 59

## 2021-02-16 DIAGNOSIS — E0843 Diabetes mellitus due to underlying condition with diabetic autonomic (poly)neuropathy: Secondary | ICD-10-CM

## 2021-02-16 DIAGNOSIS — M7752 Other enthesopathy of left foot: Secondary | ICD-10-CM

## 2021-02-16 MED ORDER — MELOXICAM 15 MG PO TABS: 15.0000 mg | ORAL_TABLET | Freq: Every day | ORAL | 1 refills | Status: DC

## 2021-02-16 MED ORDER — BETAMETHASONE SOD PHOS & ACET 6 (3-3) MG/ML IJ SUSP
3.0000 mg | Freq: Once | INTRAMUSCULAR | Status: AC
  Administered 2021-02-16: 3 mg via INTRA_ARTICULAR

## 2021-02-16 NOTE — Progress Notes (Signed)
   HPI: 55 y.o. female presenting today as a new patient for evaluation of left forefoot pain is been going on for several months now.  She does have a history of open wound between the fourth and fifth toes which is healed uneventfully.  She continues to have pain in the left fifth toe.  She presents for further treatment and evaluation  Past Medical History:  Diagnosis Date  . Essential tremor 04/24/2013  . Hypertension   . Hypokalemia   . Migraine 12/31/2013  . T2DM (type 2 diabetes mellitus) ([place]) 09/13/2017     Physical Exam: General: The patient is alert and oriented x3 in no acute distress.  Dermatology: Skin is warm, dry and supple bilateral lower extremities. Negative for open lesions or macerations.  Vascular: Palpable pedal pulses bilaterally. No edema or erythema noted. Capillary refill within normal limits.  Neurological: Epicritic and protective threshold grossly intact bilaterally.   Musculoskeletal Exam: Range of motion within normal limits to all pedal and ankle joints bilateral. Muscle strength 5/5 in all groups bilateral.   Radiographic Exam:  Normal osseous mineralization. Joint spaces preserved. No fracture/dislocation/boney destruction.    Assessment: 1.  Fifth MTPJ capsulitis left   Plan of Care:  1. Patient evaluated. X-Rays reviewed.  2.  Injection of 0.5 cc Celestone Soluspan injection of the fifth MTPJ left 3.  Prescription for meloxicam 15 mg daily 4.  Patient believes that perhaps her pain is coming from irritation from her work boots.  Recommend close observation of her work boots and she may need to get a different pair that are wider in the toebox area 5.  Return to clinic in 4 weeks  *[place].  Daughter's name is Scientist, forensic, Engineer, maintenance (IT) for a [employer] firm      Edrick Kins, DPM [facility]  Dr. Edrick Kins, DPM    [address],  [address]                Office [phone]  Fax [phone]

## 2021-03-02 ENCOUNTER — Encounter: Payer: Self-pay | Admitting: Student

## 2021-03-02 ENCOUNTER — Ambulatory Visit (INDEPENDENT_AMBULATORY_CARE_PROVIDER_SITE_OTHER): Payer: 59 | Admitting: Student

## 2021-03-02 ENCOUNTER — Other Ambulatory Visit: Payer: Self-pay

## 2021-03-02 VITALS — BP 159/101 | HR 91 | Temp 98.2°F | Ht 62.0 in | Wt 169.0 lb

## 2021-03-02 DIAGNOSIS — J029 Acute pharyngitis, unspecified: Secondary | ICD-10-CM

## 2021-03-02 DIAGNOSIS — B9689 Other specified bacterial agents as the cause of diseases classified elsewhere: Secondary | ICD-10-CM | POA: Insufficient documentation

## 2021-03-02 HISTORY — DX: Acute pharyngitis, unspecified: J02.9

## 2021-03-02 MED ORDER — ACETAMINOPHEN ER 650 MG PO TBCR: 650.0000 mg | EXTENDED_RELEASE_TABLET | Freq: Four times a day (QID) | ORAL | 0 refills | Status: AC | PRN

## 2021-03-02 NOTE — Assessment & Plan Note (Signed)
Ashley Huynh presents to clinic with three day history of sore throat, fatigue, and headaches. She states symptoms started last Friday and have been constant since then. Mentions she has taken some Tylenol with minimal relief. When asked about her eyes, states her they are often red but there is now some drainage, mainly in the morning. She also reports decreased Reports she has had allergies in the past but this feels different. Denies dyspnea, chest pain, cough, rhinorrhea, ear pain, nausea, vomiting, abdominal pain, diarrhea. Denies sick household or close contacts. She did received J&J COVID-19 booster roughly 3 weeks ago.  A/P: Patient likely has acute pharyngitis, most likely viral. Centor score of 1. Counseled patient on supportive care measures, including hydration and Tylenol as needed. Encouraged patient to return to clinic if symptoms worsen or do not improve by the end of the week. Can also consider allergies as etiology, but less likely with decreased appetite and overal fatigue. Patient's anterior cervical lymphadenopathy most consistent with inflammatory etiology, will need to monitor at next visit for resolution. - Supportive care - Tylenol 650mg  q4h PRN - Monitor LAD for resolution at next visit

## 2021-03-02 NOTE — Patient Instructions (Signed)
Ms. Crafton,  It was a pleasure seeing you today!  Today we discussed your recent illness. This is likely a throat infection, likely viral. If your symptoms do not improve by the end of the week, please make an appointment to reutrn. I have written you a work excuse note for the next few days. If you need more time due to your symptoms, please let us know. In the mean time, make sure to drink plenty of fluids and take Tylenol as needed. You can take 650mg  every four hours as needed.   We look forward to seeing you next time. Please call our clinic at [phone] if you have any questions or concerns. The best time to call is Monday-Friday from 9am-4pm, but there is someone available 24/7 at the same number. If you need medication refills, please notify your pharmacy one week in advance and they will send [place] a request.  Thank you for letting us take part in your care. Wishing you the best!  Thank you, Dr. Sanjuan Dame, MD

## 2021-03-02 NOTE — Progress Notes (Signed)
   CC: sore throat, fatigue  HPI:  [name] is a 55 y.o. with history as listed below presenting to [facility] for sore throat and fatigue.  Please see problem-based list for further details, assessments, and plans.  Past Medical History:  Diagnosis Date  . Essential tremor 04/24/2013  . Hypertension   . Hypokalemia   . Migraine 12/31/2013  . T2DM (type 2 diabetes mellitus) ([place]) 09/13/2017   Review of Systems:  As per HPI  Physical Exam:  Vitals:   03/02/21 1528  BP: (!) 159/101  Pulse: 91  Temp: 98.2 F (36.8 C)  TempSrc: Oral  SpO2: 97%  Weight: 169 lb (76.7 kg)  Height: 5\' 2"  (1.575 m)   General: Sitting comfortably in room, no acute distress HENT: Normocephalic. Painful anterior cervical lymphadenopathy present, R>L. No auricular, clavicular, or posterior cervical lymphadenopathy. Tonsils erythematous, no swelling or exudate. Eyes: Conjunctival injection bilaterally, no drainage or purulence appreciated. CV: Regular rate, rhythm. No m/r/g appreciated Pulm: Normal work of breathing. Clear to ausculation bilaterally.  Assessment & Plan:   See Encounters Tab for problem based charting.  Patient discussed with Dr. Philipp Ovens

## 2021-03-03 ENCOUNTER — Telehealth: Payer: Self-pay

## 2021-03-03 NOTE — Telephone Encounter (Signed)
Result is not yet back.

## 2021-03-03 NOTE — Telephone Encounter (Signed)
Requesting COVID test result, please call pt back.

## 2021-03-04 ENCOUNTER — Encounter: Payer: Self-pay | Admitting: Student

## 2021-03-04 ENCOUNTER — Telehealth: Payer: Self-pay

## 2021-03-04 LAB — SARS-COV-2, NAA 2 DAY TAT

## 2021-03-04 LAB — NOVEL CORONAVIRUS, NAA: SARS-CoV-2, NAA: NOT DETECTED

## 2021-03-04 NOTE — Telephone Encounter (Signed)
Thanks Stacee! 

## 2021-03-04 NOTE — Telephone Encounter (Signed)
Pt daughter is requesting a call back about her results for covid test done

## 2021-03-04 NOTE — Telephone Encounter (Signed)
RTC to daughter [facility]) who is with patient and they were both notified that the Covid test was negative.  They were informed that Dr. Collene Gobble did try calling, could not reach patient by phone, so he mailed a letter to her notifying her of the negative Covid test result.  Patient states she is feeling much better.  Asking if she can go back to work.  RN informed patient yes and there is no need to quarantine since results are negative. SChaplin, RN,BSN

## 2021-03-05 NOTE — Progress Notes (Signed)
Internal Medicine Clinic Attending ? ?Case discussed with Dr. Braswell  At the time of the visit.  We reviewed the resident?s history and exam and pertinent patient test results.  I agree with the assessment, diagnosis, and plan of care documented in the resident?s note.  ?

## 2021-03-09 ENCOUNTER — Other Ambulatory Visit: Payer: 59

## 2021-03-16 ENCOUNTER — Other Ambulatory Visit: Payer: Self-pay

## 2021-03-16 ENCOUNTER — Ambulatory Visit (INDEPENDENT_AMBULATORY_CARE_PROVIDER_SITE_OTHER): Payer: 59 | Admitting: Podiatry

## 2021-03-16 DIAGNOSIS — M7752 Other enthesopathy of left foot: Secondary | ICD-10-CM

## 2021-03-16 DIAGNOSIS — E0843 Diabetes mellitus due to underlying condition with diabetic autonomic (poly)neuropathy: Secondary | ICD-10-CM | POA: Diagnosis not present

## 2021-03-19 NOTE — Progress Notes (Signed)
   HPI: 55 y.o. female presenting today for follow-up evaluation of fifth MTPJ capsulitis to the left foot.  Patient states that since the injection she has had no pain.  She is doing very well.  She is currently not taking meloxicam.  She is wearing good supportive shoes and has no new complaints at this time  Past Medical History:  Diagnosis Date  . Essential tremor 04/24/2013  . Hypertension   . Hypokalemia   . Migraine 12/31/2013  . T2DM (type 2 diabetes mellitus) ([place]) 09/13/2017     Physical Exam: General: The patient is alert and oriented x3 in no acute distress.  Dermatology: Skin is warm, dry and supple bilateral lower extremities. Negative for open lesions or macerations.  Vascular: Palpable pedal pulses bilaterally. No edema or erythema noted. Capillary refill within normal limits.  Neurological: Epicritic and protective threshold grossly intact bilaterally.   Musculoskeletal Exam: Range of motion within normal limits to all pedal and ankle joints bilateral. Muscle strength 5/5 in all groups bilateral.  Negative for any significant pain on palpation to the fifth MTPJ today  Assessment: 1.  Fifth MTPJ capsulitis left; resolved   Plan of Care:  1. Patient evaluated.  2.  Patient may discontinue meloxicam daily 3.  Recommend good supportive shoes that do not irritate and aggravate the toe area 4.  Return to clinic as needed  *[place].  Daughter's name is Scientist, forensic, Engineer, maintenance (IT) for a [employer] firm      Edrick Kins, DPM [facility]  Dr. Edrick Kins, DPM    [address], [place] [address]                Office [phone]  Fax [phone]

## 2021-03-25 ENCOUNTER — Encounter: Payer: 59 | Admitting: Gastroenterology

## 2021-04-20 ENCOUNTER — Other Ambulatory Visit: Payer: Self-pay

## 2021-04-20 ENCOUNTER — Ambulatory Visit (INDEPENDENT_AMBULATORY_CARE_PROVIDER_SITE_OTHER): Payer: 59 | Admitting: Podiatry

## 2021-04-20 DIAGNOSIS — M2041 Other hammer toe(s) (acquired), right foot: Secondary | ICD-10-CM | POA: Diagnosis not present

## 2021-04-20 DIAGNOSIS — M2042 Other hammer toe(s) (acquired), left foot: Secondary | ICD-10-CM

## 2021-04-20 DIAGNOSIS — M79671 Pain in right foot: Secondary | ICD-10-CM | POA: Diagnosis not present

## 2021-04-20 DIAGNOSIS — E0843 Diabetes mellitus due to underlying condition with diabetic autonomic (poly)neuropathy: Secondary | ICD-10-CM | POA: Diagnosis not present

## 2021-04-20 DIAGNOSIS — M79672 Pain in left foot: Secondary | ICD-10-CM

## 2021-04-20 NOTE — Progress Notes (Signed)
   HPI: 55 y.o. female presenting today PMHx DM type II for evaluation of pain and tenderness to the bilateral feet.  She states that it is very painful with weightbearing.  She presents for further treatment evaluation.  She does have a history of diabetes  Past Medical History:  Diagnosis Date  . Essential tremor 04/24/2013  . Hypertension   . Hypokalemia   . Migraine 12/31/2013  . T2DM (type 2 diabetes mellitus) ([place]) 09/13/2017      Objective: Physical Exam General: The patient is alert and oriented x3 in no acute distress.  Dermatology: Skin is cool, dry and supple bilateral lower extremities. Negative for open lesions or macerations.  Vascular: Palpable pedal pulses bilaterally. No edema or erythema noted. Capillary refill within normal limits.  Neurological: Epicritic and protective threshold diminished bilaterally.   Musculoskeletal Exam: All pedal and ankle joints range of motion within normal limits bilateral. Muscle strength 5/5 in all groups bilateral. Hammertoe contracture deformity noted to digits 2-5 of the bilateral foot.   Assessment: 1.  Diabetes mellitus with peripheral polyneuropathy 2.  Hammertoe deformity 2-5 bilateral   Plan of Care:  1. Patient evaluated. 2.  Comprehensive diabetic foot exam was performed today. 3.  Appointment with Pedorthist for custom molded diabetic shoes and insoles 4.  Return to clinic as needed  Edrick Kins, DPM [facility]  Dr. Edrick Kins, DPM    [address], [place] [address]                Office [phone]  Fax [phone]

## 2021-05-04 ENCOUNTER — Other Ambulatory Visit: Payer: 59

## 2021-05-04 ENCOUNTER — Other Ambulatory Visit: Payer: Self-pay

## 2021-05-04 ENCOUNTER — Ambulatory Visit (AMBULATORY_SURGERY_CENTER): Payer: Self-pay | Admitting: *Deleted

## 2021-05-04 VITALS — Ht 62.0 in | Wt 164.0 lb

## 2021-05-04 DIAGNOSIS — Z1211 Encounter for screening for malignant neoplasm of colon: Secondary | ICD-10-CM

## 2021-05-04 MED ORDER — NA SULFATE-K SULFATE-MG SULF 17.5-3.13-1.6 GM/177ML PO SOLN: ORAL | 0 refills | Status: DC

## 2021-05-04 NOTE — Progress Notes (Signed)
Patient and interpreter is here in-person for PV. Interpreter/Patient denies any allergies to eggs or soy, Patient denies any problems with anesthesia/sedation. Patient denies any oxygen use at home, Patient denies taking any diet/weight loss medications or blood thinners. Patient is not being treated for MRSA or C-diff. Patient is aware of our care-partner policy and AYTKZ-60 safety protocol.   Patient is COVID-19 vaccinated, per patient.

## 2021-05-12 ENCOUNTER — Encounter: Payer: Self-pay | Admitting: Certified Registered Nurse Anesthetist

## 2021-05-13 ENCOUNTER — Ambulatory Visit (AMBULATORY_SURGERY_CENTER): Payer: 59 | Admitting: Gastroenterology

## 2021-05-13 ENCOUNTER — Other Ambulatory Visit: Payer: Self-pay

## 2021-05-13 ENCOUNTER — Encounter: Payer: Self-pay | Admitting: Gastroenterology

## 2021-05-13 VITALS — BP 151/85 | HR 72 | Temp 98.6°F | Resp 14 | Ht 62.0 in | Wt 164.0 lb

## 2021-05-13 DIAGNOSIS — D122 Benign neoplasm of ascending colon: Secondary | ICD-10-CM | POA: Diagnosis not present

## 2021-05-13 DIAGNOSIS — Z1211 Encounter for screening for malignant neoplasm of colon: Secondary | ICD-10-CM | POA: Diagnosis present

## 2021-05-13 DIAGNOSIS — D123 Benign neoplasm of transverse colon: Secondary | ICD-10-CM | POA: Diagnosis not present

## 2021-05-13 MED ORDER — SODIUM CHLORIDE 0.9 % IV SOLN: 500.0000 mL | Freq: Once | INTRAVENOUS | Status: DC

## 2021-05-13 NOTE — Progress Notes (Signed)
Called to room to assist during endoscopic procedure.  Patient ID and intended procedure confirmed with present staff. Received instructions for my participation in the procedure from the performing physician.  

## 2021-05-13 NOTE — Patient Instructions (Signed)
YOU HAD AN ENDOSCOPIC PROCEDURE TODAY AT THE [facility]:   Refer to the procedure report that was given to you for any specific questions about what was found during the examination.  If the procedure report does not answer your questions, please call your gastroenterologist to clarify.  If you requested that your care partner not be given the details of your procedure findings, then the procedure report has been included in a sealed envelope for you to review at your convenience later.  YOU SHOULD EXPECT: Some feelings of bloating in the abdomen. Passage of more gas than usual.  Walking can help get rid of the air that was put into your GI tract during the procedure and reduce the bloating. If you had a lower endoscopy (such as a colonoscopy or flexible sigmoidoscopy) you may notice spotting of blood in your stool or on the toilet paper. If you underwent a bowel prep for your procedure, you may not have a normal bowel movement for a few days.  Please Note:  You might notice some irritation and congestion in your nose or some drainage.  This is from the oxygen used during your procedure.  There is no need for concern and it should clear up in a day or so.  SYMPTOMS TO REPORT IMMEDIATELY:   Following lower endoscopy (colonoscopy or flexible sigmoidoscopy):  Excessive amounts of blood in the stool  Significant tenderness or worsening of abdominal pains  Swelling of the abdomen that is new, acute  Fever of 100F or higher  For urgent or emergent issues, a gastroenterologist can be reached at any hour by calling [phone]. Do not use MyChart messaging for urgent concerns.    DIET:  We do recommend a small meal at first, but then you may proceed to your regular diet.  Drink plenty of fluids but you should avoid alcoholic beverages for 24 hours.  ACTIVITY:  You should plan to take it easy for the rest of today and you should NOT DRIVE or use heavy machinery until tomorrow (because  of the sedation medicines used during the test).    FOLLOW UP: Our staff will call the number listed on your records 48-72 hours following your procedure to check on you and address any questions or concerns that you may have regarding the information given to you following your procedure. If we do not reach you, we will leave a message.  We will attempt to reach you two times.  During this call, we will ask if you have developed any symptoms of COVID 19. If you develop any symptoms (ie: fever, flu-like symptoms, shortness of breath, cough etc.) before then, please call [phone].  If you test positive for Covid 19 in the 2 weeks post procedure, please call and report this information to us.    If any biopsies were taken you will be contacted by phone or by letter within the next 1-3 weeks.  Please call us at [phone] if you have not heard about the biopsies in 3 weeks.    SIGNATURES/CONFIDENTIALITY: You and/or your care partner have signed paperwork which will be entered into your electronic medical record.  These signatures attest to the fact that that the information above on your After Visit Summary has been reviewed and is understood.  Full responsibility of the confidentiality of this discharge information lies with you and/or your care-partner. 

## 2021-05-13 NOTE — Progress Notes (Signed)
BP   198/127, Labetalol given IV, MD update, vss

## 2021-05-13 NOTE — Op Note (Signed)
[place] Patient Name: Ashley Huynh Procedure Date: 05/13/2021 10:26 AM MRN: [id number] Endoscopist: Remo Lipps P. Havery Moros , MD Age: 55 Referring MD:  Date of Birth: 01-10-1966 Gender: Female Account #: [id number] Procedure:                Colonoscopy Indications:              Screening for colorectal malignant neoplasm, This                            is the patient's first colonoscopy Medicines:                Monitored Anesthesia Care Procedure:                Pre-Anesthesia Assessment:                           - Prior to the procedure, a History and Physical                            was performed, and patient medications and                            allergies were reviewed. The patient's tolerance of                            previous anesthesia was also reviewed. The risks                            and benefits of the procedure and the sedation                            options and risks were discussed with the patient.                            All questions were answered, and informed consent                            was obtained. Prior Anticoagulants: The patient has                            taken no previous anticoagulant or antiplatelet                            agents. ASA Grade Assessment: II - A patient with                            mild systemic disease. After reviewing the risks                            and benefits, the patient was deemed in                            satisfactory condition to undergo the procedure.  After obtaining informed consent, the colonoscope                            was passed under direct vision. Throughout the                            procedure, the patient's blood pressure, pulse, and                            oxygen saturations were monitored continuously. The                            Olympus PFC-[id number] (#[id number]) Colonoscope was                            introduced through  the anus and advanced to the the                            cecum, identified by appendiceal orifice and                            ileocecal valve. The colonoscopy was performed                            without difficulty. The patient tolerated the                            procedure well. The quality of the bowel                            preparation was good. The ileocecal valve,                            appendiceal orifice, and rectum were photographed. Scope In: 10:34:49 AM Scope Out: 10:52:07 AM Scope Withdrawal Time: 0 hours 15 minutes 18 seconds  Total Procedure Duration: 0 hours 17 minutes 18 seconds  Findings:                 The perianal and digital rectal examinations were                            normal.                           Two flat polyps were found in the ascending colon.                            The polyps were diminutive in size. These polyps                            were removed with a cold biopsy forceps. Resection                            and retrieval were complete.  A diminutive polyp was found in the hepatic                            flexure. The polyp was sessile. The polyp was                            removed with a cold biopsy forceps. Resection and                            retrieval were complete.                           Internal hemorrhoids were found during retroflexion.                           The exam was otherwise without abnormality. Complications:            No immediate complications. Estimated blood loss:                            Minimal. Estimated Blood Loss:     Estimated blood loss was minimal. Impression:               - Two diminutive polyps in the ascending colon,                            removed with a cold biopsy forceps. Resected and                            retrieved.                           - One diminutive polyp at the hepatic flexure,                            removed with a  cold biopsy forceps. Resected and                            retrieved.                           - Internal hemorrhoids.                           - The examination was otherwise normal. Recommendation:           - Patient has a contact number available for                            emergencies. The signs and symptoms of potential                            delayed complications were discussed with the                            patient. Return to normal activities tomorrow.  Written discharge instructions were provided to the                            patient.                           - Resume previous diet.                           - Continue present medications.                           - Await pathology results. Remo Lipps P. Havery Moros, MD 05/13/2021 10:55:53 AM This report has been signed electronically.

## 2021-05-13 NOTE — Progress Notes (Signed)
Report given to PACU, vss 

## 2021-05-13 NOTE — Progress Notes (Signed)
Pt. Reports no change in her medical or surgical history since her pre-visit 05/04/2021.

## 2021-05-15 ENCOUNTER — Telehealth: Payer: Self-pay

## 2021-05-15 NOTE — Telephone Encounter (Signed)
LVM

## 2021-06-10 ENCOUNTER — Ambulatory Visit (INDEPENDENT_AMBULATORY_CARE_PROVIDER_SITE_OTHER): Payer: 59 | Admitting: Podiatry

## 2021-06-10 ENCOUNTER — Other Ambulatory Visit: Payer: Self-pay

## 2021-06-10 DIAGNOSIS — M67472 Ganglion, left ankle and foot: Secondary | ICD-10-CM

## 2021-06-10 DIAGNOSIS — M778 Other enthesopathies, not elsewhere classified: Secondary | ICD-10-CM

## 2021-06-10 MED ORDER — MELOXICAM 15 MG PO TABS: 15.0000 mg | ORAL_TABLET | Freq: Every day | ORAL | 1 refills | Status: DC

## 2021-06-10 NOTE — Progress Notes (Signed)
   HPI: 55 y.o. female presenting today PMHx DM type II for evaluation of pain and tenderness to the bilateral feet.  She states that it is very painful with weightbearing.  She presents for further treatment evaluation.   Patient has a new complaint today regarding small bump to the lateral aspect of the left forefoot which is very symptomatic in shoes.  She denies a history of injury.  She has not done anything for treatment.  Is been present for about 6 weeks now.  She presents for further treatment and evaluation   Past Medical History:  Diagnosis Date   Essential tremor 04/24/2013   Hyperlipidemia    Hypertension    Hypokalemia    Migraine 12/31/2013   T2DM (type 2 diabetes mellitus) ([place]) 09/13/2017      Objective: Physical Exam General: The patient is alert and oriented x3 in no acute distress.  Dermatology: Skin is cool, dry and supple bilateral lower extremities. Negative for open lesions or macerations.  Vascular: Palpable pedal pulses bilaterally. No edema or erythema noted. Capillary refill within normal limits.  Neurological: Epicritic and protective threshold diminished bilaterally.   Musculoskeletal Exam: All pedal and ankle joints range of motion within normal limits bilateral. Muscle strength 5/5 in all groups bilateral. Hammertoe contracture deformity noted to digits 2-5 of the bilateral foot.  There is associated tenderness palpation also to the fifth digits bilateral Not adhered small palpable fluctuant lesion noted to the left lateral forefoot overlying the fifth EDL tendon with associated tenderness to palpation   Assessment: 1.  Diabetes mellitus with peripheral polyneuropathy 2.  Hammertoe deformity 2-5 bilateral 3.  Capsulitis fifth digits bilateral 4.  Ganglion cyst left lateral forefoot   Plan of Care:  1. Patient evaluated. 2.  Injection of 0.5 cc Celestone Soluspan injected into the ganglion on tendon and tendon sheath of the lateral aspect of the left  forefoot 3.  Regarding the bilateral fifth toe pain, recommend that she wears wide fitting shoes and sandals that do not constrict the toebox or rub against the fifth toe 4.  Prescription for meloxicam 15 mg daily as needed 5.  Return to clinic as needed  Edrick Kins, DPM [facility]  Dr. Edrick Kins, DPM    [address], [place] [address]                Office [phone]  Fax [phone]

## 2021-06-30 ENCOUNTER — Ambulatory Visit
Admission: RE | Admit: 2021-06-30 | Discharge: 2021-06-30 | Disposition: A | Discharge status: Home / Self Care | Payer: 59 | Source: Ambulatory Visit | Attending: Internal Medicine | Admitting: Internal Medicine

## 2021-06-30 ENCOUNTER — Other Ambulatory Visit: Payer: Self-pay

## 2021-06-30 DIAGNOSIS — Z1231 Encounter for screening mammogram for malignant neoplasm of breast: Secondary | ICD-10-CM

## 2021-09-11 ENCOUNTER — Other Ambulatory Visit: Payer: Self-pay

## 2021-09-11 ENCOUNTER — Encounter: Payer: Self-pay | Admitting: Internal Medicine

## 2021-09-11 ENCOUNTER — Ambulatory Visit (INDEPENDENT_AMBULATORY_CARE_PROVIDER_SITE_OTHER): Payer: 59 | Admitting: Internal Medicine

## 2021-09-11 VITALS — BP 161/96 | HR 80 | Temp 97.9°F | Ht 64.0 in | Wt 159.6 lb

## 2021-09-11 DIAGNOSIS — Z23 Encounter for immunization: Secondary | ICD-10-CM

## 2021-09-11 DIAGNOSIS — I10 Essential (primary) hypertension: Secondary | ICD-10-CM

## 2021-09-11 DIAGNOSIS — R5383 Other fatigue: Secondary | ICD-10-CM

## 2021-09-11 DIAGNOSIS — E119 Type 2 diabetes mellitus without complications: Secondary | ICD-10-CM | POA: Diagnosis not present

## 2021-09-11 DIAGNOSIS — Z0000 Encounter for general adult medical examination without abnormal findings: Secondary | ICD-10-CM | POA: Diagnosis not present

## 2021-09-11 DIAGNOSIS — M25562 Pain in left knee: Secondary | ICD-10-CM

## 2021-09-11 LAB — POCT GLYCOSYLATED HEMOGLOBIN (HGB A1C): Hemoglobin A1C: 6.9 % — AB (ref 4.0–5.6)

## 2021-09-11 LAB — GLUCOSE, CAPILLARY: Glucose-Capillary: 151 mg/dL — ABNORMAL HIGH (ref 70–99)

## 2021-09-11 MED ORDER — AMLODIPINE BESYLATE 10 MG PO TABS: 10.0000 mg | ORAL_TABLET | Freq: Every day | ORAL | 3 refills | Status: DC

## 2021-09-11 MED ORDER — DICLOFENAC SODIUM 1 % EX GEL: 4.0000 g | Freq: Four times a day (QID) | CUTANEOUS | 0 refills | Status: DC

## 2021-09-11 MED ORDER — LISINOPRIL 10 MG PO TABS: 10.0000 mg | ORAL_TABLET | Freq: Every day | ORAL | 3 refills | Status: DC

## 2021-09-11 MED ORDER — BLOOD GLUCOSE METER KIT: PACK | 0 refills | Status: AC

## 2021-09-11 MED ORDER — FREESTYLE LANCETS MISC: 12 refills | Status: DC

## 2021-09-11 MED ORDER — CVS GLUCOSE METER TEST STRIPS VI STRP: ORAL_STRIP | 12 refills | Status: DC

## 2021-09-11 NOTE — Assessment & Plan Note (Signed)
Patient with blood pressure of 844B systolic today.  She reports that she is typically compliant with her medications however she did not take them this morning.  She denies any headache chest pain or shortness of breath.  Advised patient take medications each day.  We will make no changes to her regimen currently.

## 2021-09-11 NOTE — Assessment & Plan Note (Addendum)
Left knee pain 2 months in duration. On physical exam she is tender to palpation with no edema or erythema.  There is an audible click reproducible with movement each time. Consider osteoarthritis versus meniscal injury versus patellofemoral syndrome.  Recommended that the patient begin to take scheduled Tylenol to help with pain, also prescribed voltaren gel to apply.  Ordered x-ray to rule out meniscal injury.

## 2021-09-11 NOTE — Assessment & Plan Note (Addendum)
It is possible that the patient's recent dietary changes are the cause of her weight loss however do not feel that it adequately explains her fatigue. Patient had metabolic panel and CBC done approximately 6 months ago we will hold off on repeating these for now. Will check TSH T3 and free T4.

## 2021-09-11 NOTE — Assessment & Plan Note (Addendum)
Type 2 diabetes with complications including diabetic foot ulcer and oral thrush diagnosed in February 2022.  He is currently on metformin 1000 mg twice daily.  A1c 6.9 today, however she is reporting polydipsia and blurry vision.  She does not check her blood sugar at home.  Patient with type 2 diabetes with complications and reporting symptoms consistent with poorly managed diabetes.  Patient is also endorsing some bilateral pins and needle sensation which could be attributed to metformin B12 depletion.  We will check B12 level.  Will order a glucose meter to evaluate for highs and lows.  Advised patient to monitor blood sugars at home and bring log with her.  Urine collected for UA and creatinine microalbumin ratio also ordere.we will continue with metformin for now.  Referral to ophthalmology also placed.

## 2021-09-11 NOTE — Patient Instructions (Addendum)
Dear Ashley Huynh,  Thank you for trusting [place] with your care today.  Today we discussed your knee pain as well as your diabetes.  For your knee pain we recommend taking Tylenol on a more scheduled basis, I have also sent in a prescription for Voltaren gel to apply to your knees, and in order for an x-ray to be done of your left knee.  This can be done sometime next week.  As for your diabetes I have sent in an order for a glucose meter as well as the test strips and lancets.  We will also check a B12 level.  We have also ordered a urine microalbumin and urinalysis to evaluate for any sugar that is spilling into your urine.  We also recommend seeing an ophthalmologist to have your eyes evaluated.  As for your hypertension we recommend continuing to take your medications as prescribed.  Lastly for the fatigue and weight loss we will check your thyroid.  When you are checking your blood sugar we recommend that you keep a log of your daily values. Please follow-up in 3 months for a diabetes recheck.

## 2021-09-11 NOTE — Progress Notes (Signed)
CC: knee pain and a1c  HPI:[name] is a 55 y.o. female who presents for evaluation of knee pain and a1c. Please see individual problem based A/P for details.  Please see encounters tab for problem-based charting.  Problem List Items Addressed This Visit       Cardiovascular and Mediastinum   Essential hypertension (Chronic)    Patient with blood pressure of 128N systolic today.  She reports that she is typically compliant with her medications however she did not take them this morning.  She denies any headache chest pain or shortness of breath.  Advised patient take medications each day.  We will make no changes to her regimen currently.      Relevant Medications   amLODipine (NORVASC) 10 MG tablet   lisinopril (ZESTRIL) 10 MG tablet     Endocrine   Type 2 diabetes mellitus ([place]) - Primary (Chronic)    Type 2 diabetes with complications including diabetic foot ulcer and oral thrush diagnosed in February 2022.  He is currently on metformin 1000 mg twice daily.  A1c 6.9 today, however she is reporting polydipsia and blurry vision.  She does not check her blood sugar at home.  Patient with type 2 diabetes with complications and reporting symptoms consistent with poorly managed diabetes.  Patient is also endorsing some bilateral pins and needle sensation which could be attributed to metformin B12 depletion.  We will check B12 level.  Will order a glucose meter to evaluate for highs and lows.  Advised patient to monitor blood sugars at home and bring log with her.  Urine collected for UA and creatinine microalbumin ratio also ordere.we will continue with metformin for now.  Referral to ophthalmology also placed.      Relevant Medications   lisinopril (ZESTRIL) 10 MG tablet   Other Relevant Orders   POC Hbg A1C (Completed)   Microalbumin / Creatinine Urine Ratio   Vitamin B12   Urinalysis, Reflex Microscopic   Ambulatory referral to Ophthalmology     Other    Healthcare maintenance (Chronic)   Relevant Orders   Hepatitis C antibody   Left knee pain    Left knee pain 2 months in duration. On physical exam she is tender to palpation with no edema or erythema.  There is an audible click reproducible with movement each time. Consider osteoarthritis versus meniscal injury versus patellofemoral syndrome.  Recommended that the patient begin to take scheduled Tylenol to help with pain, also prescribed voltaren gel to apply.  Ordered x-ray to rule out meniscal injury.      Relevant Orders   DG Knee Complete 4 Views Left   Fatigue    It is possible that the patient's recent dietary changes are the cause of her weight loss however do not feel that it adequately explains her fatigue. Patient had metabolic panel and CBC done approximately 6 months ago we will hold off on repeating these for now. Will check TSH T3 and free T4.      Other Visit Diagnoses     Need for immunization against influenza       Relevant Orders   Flu Vaccine QUAD 20mo+IM (Fluarix, Fluzone & Alfiuria Quad PF) (Completed)        Depression, PHQ-9: Based on the patients  [place] Office Visit from 09/11/2021 in [place]  PHQ-9 Total Score 2      score we have 2.  Past Medical History:  Diagnosis Date  Essential tremor 04/24/2013   Hyperlipidemia    Hypertension    Hypokalemia    Migraine 12/31/2013   T2DM (type 2 diabetes mellitus) ([place]) 09/13/2017   Review of Systems:   Review of Systems  Constitutional:  Positive for malaise/fatigue and weight loss.  HENT: Negative.    Respiratory: Negative.    Cardiovascular: Negative.   Gastrointestinal: Negative.   Genitourinary:  Positive for frequency.  Musculoskeletal: Negative.   Skin: Negative.   Neurological: Negative.   Endo/Heme/Allergies:  Positive for polydipsia.  Psychiatric/Behavioral: Negative.      Physical Exam: Vitals:   09/11/21 0949 09/11/21 1050  BP: (!) 156/98 (!)  161/96  Pulse: 80   Temp: 97.9 F (36.6 C)   TempSrc: Oral   SpO2: 100%   Weight: 159 lb 9.6 oz (72.4 kg)   Height: 5\' 4"  (1.626 m)      General: Alert and oriented no acute distress HEENT: Conjunctiva nl , antiicteric sclerae, moist mucous membranes, no exudate or erythema Cardiovascular: Normal rate, regular rhythm.  No murmurs, rubs, or gallops Pulmonary : Equal breath sounds, No wheezes, rales, or rhonchi Abdominal: soft, nontender,  bowel sounds present Ext: No edema in lower extremities, no tenderness to palpation of lower extremities.   Assessment & Plan:   See Encounters Tab for problem based charting.  Patient seen with Dr. Angelia Mould

## 2021-09-11 NOTE — Progress Notes (Signed)
Internal Medicine Clinic Attending  I saw and evaluated the patient.  I personally confirmed the key portions of the history and exam documented by Dr. Gawaluck and I reviewed pertinent patient test results.  The assessment, diagnosis, and plan were formulated together and I agree with the documentation in the resident's note.  

## 2021-09-12 LAB — MICROALBUMIN / CREATININE URINE RATIO
Creatinine, Urine: 258.2 mg/dL
Microalb/Creat Ratio: 264 mg/g creat — ABNORMAL HIGH (ref 0–29)
Microalbumin, Urine: 680.5 ug/mL

## 2021-09-12 LAB — MICROSCOPIC EXAMINATION
Bacteria, UA: NONE SEEN
Casts: NONE SEEN /lpf
WBC, UA: >30 /hpf — AB (ref 0–5)

## 2021-09-12 LAB — URINALYSIS, ROUTINE W REFLEX MICROSCOPIC
Bilirubin, UA: NEGATIVE
Glucose, UA: NEGATIVE
Nitrite, UA: NEGATIVE
Specific Gravity, UA: 1.021 (ref 1.005–1.030)
Urobilinogen, Ur: 0.2 mg/dL (ref 0.2–1.0)
pH, UA: 6.5 (ref 5.0–7.5)

## 2021-09-12 LAB — VITAMIN B12: Vitamin B-12: 609 pg/mL (ref 232–1245)

## 2021-09-12 LAB — HEPATITIS C ANTIBODY: Hep C Virus Ab: <0.1 s/co ratio (ref 0.0–0.9)

## 2021-09-15 MED ORDER — GLUCOSE BLOOD VI STRP: ORAL_STRIP | 12 refills | Status: DC

## 2021-09-15 NOTE — Addendum Note (Signed)
Addended by: Delene Ruffini T on: 09/15/2021 01:25 PM   Modules accepted: Orders

## 2021-09-30 ENCOUNTER — Encounter: Payer: Self-pay | Admitting: Internal Medicine

## 2021-09-30 ENCOUNTER — Ambulatory Visit (INDEPENDENT_AMBULATORY_CARE_PROVIDER_SITE_OTHER): Payer: 59 | Admitting: Internal Medicine

## 2021-09-30 ENCOUNTER — Other Ambulatory Visit: Payer: Self-pay | Admitting: Internal Medicine

## 2021-09-30 ENCOUNTER — Other Ambulatory Visit (HOSPITAL_COMMUNITY)
Admission: RE | Admit: 2021-09-30 | Discharge: 2021-09-30 | Disposition: A | Discharge status: Home / Self Care | Payer: 59 | Source: Ambulatory Visit | Attending: Internal Medicine | Admitting: Internal Medicine

## 2021-09-30 VITALS — BP 159/98 | HR 97 | Temp 98.5°F | Wt 159.2 lb

## 2021-09-30 DIAGNOSIS — M25562 Pain in left knee: Secondary | ICD-10-CM

## 2021-09-30 DIAGNOSIS — E119 Type 2 diabetes mellitus without complications: Secondary | ICD-10-CM

## 2021-09-30 DIAGNOSIS — Z124 Encounter for screening for malignant neoplasm of cervix: Secondary | ICD-10-CM | POA: Insufficient documentation

## 2021-09-30 DIAGNOSIS — I10 Essential (primary) hypertension: Secondary | ICD-10-CM

## 2021-09-30 DIAGNOSIS — M62830 Muscle spasm of back: Secondary | ICD-10-CM

## 2021-09-30 DIAGNOSIS — E876 Hypokalemia: Secondary | ICD-10-CM

## 2021-09-30 MED ORDER — ACCU-CHEK SOFTCLIX LANCETS MISC: 12 refills | Status: AC

## 2021-09-30 MED ORDER — DICLOFENAC SODIUM 1 % EX GEL: 2.0000 g | Freq: Three times a day (TID) | CUTANEOUS | Status: DC | PRN

## 2021-09-30 MED ORDER — FREESTYLE LITE W/DEVICE KIT: 1.0000 | PACK | Freq: Every day | 1 refills | Status: AC

## 2021-09-30 MED ORDER — GLUCOSE BLOOD VI STRP: ORAL_STRIP | 12 refills | Status: DC

## 2021-09-30 MED ORDER — FREESTYLE LANCETS MISC: 12 refills | Status: AC

## 2021-09-30 MED ORDER — CYCLOBENZAPRINE HCL 5 MG PO TABS: 5.0000 mg | ORAL_TABLET | Freq: Every day | ORAL | Status: DC | PRN

## 2021-09-30 MED ORDER — ACCU-CHEK GUIDE ME W/DEVICE KIT: PACK | 0 refills | Status: DC

## 2021-09-30 MED ORDER — FREESTYLE LITE TEST VI STRP: ORAL_STRIP | 12 refills | Status: AC

## 2021-09-30 NOTE — Progress Notes (Signed)
   CC: Hospital follow up  HPI:  [name] is a 55 y.o. PMH noted below, who presents to the [facility] with complaints of low potassium and chest muscle pain. To see the management of his acute and chronic conditions, please refer to the A&P note under the encounters tab.   Past Medical History:  Diagnosis Date   Essential tremor 04/24/2013   Hyperlipidemia    Hypertension    Hypokalemia    Migraine 12/31/2013   T2DM (type 2 diabetes mellitus) ([place]) 09/13/2017   Review of Systems:  positive for chest pain, back pain  Physical Exam: Gen: WNWD woman in NAD HEENT: normocephalic atraumatic, MMM, neck supple CV: RRR, no m/r/g, no pitting edema Resp: CTAB, normal WOB  GI: soft, nontender MSK: moves all extremities without difficulty, reproducible pain over the left anterior chest wall and posterior interscapular area  Skin:warm and dry Neuro:alert answering questions appropriately Psych: normal affect   Assessment & Plan:   See Encounters Tab for problem based charting.  Patient seen with Dr. Dareen Piano

## 2021-09-30 NOTE — Assessment & Plan Note (Addendum)
Patient's blood pressure elevated today, 159/98. Patient reports elevation during recent hospital stay. Asked patient to obtain blood pressure cuff at a pharmacy and keep a log of readings at home. - continue amlodipine 10 - continue lisinopril 10 - consider increasing/starting new medications if still elevated on home log  Addendum: Aldo/renin ratio came back >90, suspect hyperaldosteronism could be at play. Will defer any confirmatory testing with fludrocortisone and start spironolactone 25.  - recheck K at next visit - titrate spironolactone

## 2021-09-30 NOTE — Assessment & Plan Note (Addendum)
Pap smear with HPV cotesting collected today -f/u results  Addendum:  Pap smear came back negative - HPV-negative with ASC-US and a prior -HPV-negative screen (risk of CIN 3+ at five years 0.36 percent). - pap smear needed in 3 years (Oct 2025)

## 2021-09-30 NOTE — Assessment & Plan Note (Signed)
Patient well controlled with an A1c of 6.6. Currently takes metformin 100 BID. Placed orders for patient to have a blood glucose meter and supplies to check at home. - continue metformin - A1c in three months

## 2021-09-30 NOTE — Assessment & Plan Note (Addendum)
Patient has anterior anterior chest and thoracic pain that appears to be musculoskeletal in nature. Is reproducible on exam but this pain is not exactly the same as the sharp shooting pain she gets sometimes. Could be related to her low levels of potassium in combination with muscle spasms. On exam her back is very tight. Counseled not to use heavy machinery or drive while using the medication. - flexeril 5 mg nightly as needed - follow up in one month

## 2021-09-30 NOTE — Assessment & Plan Note (Addendum)
Potassium was low at 2.8 on recent hospitalization, unable to see all labs from recent hospital stay. Ordered a renin/aldo ratio today given patient's persistently low levels. Patient reports this has been going on for the last ten years but it was never bad enough to be hospitalized before. - K normal on BMP today - f/u renin/aldo test  Addendum: starting spironolactone 25 mg. Informed patient to stop taking her potassium supplements when she starts this medication and to follow up in clinic in 2-4 weeks

## 2021-09-30 NOTE — Patient Instructions (Addendum)
Ashley Huynh  It was a pleasure seeing you in the clinic today.   We discussed your recent hospitalization including your chest/back pain, your low magnesium, and your low potassium. I also placed a referral for you to see the eye doctor.  Low magnesium and low potassium We checked your labs today. I will call you with the results if they are abnormal. There is also a lab test that will take a little longer that might help [place] figure out why you are having repeated episodes of these low electrolytes. Continue taking your oral potassium and magnesium supplements for now.   2. Blood pressure Please keep a log of your blood pressures daily at home. You can pick up a cuff at a pharmacy.  3. Pap smear We did a pap smear today to screen for abnormal cells/cancer. We will call you with the results if anything comes back abnormal.   4. Chest pain/back pain We prescribed you a muscle relaxer to take once a day for your back pain. Do not drive a car or operate heavy machinery while taking this medication as it can make you drowsy.   Please call our clinic at [phone] if you have any questions or concerns. The best time to call is Monday-Friday from 9am-4pm, but there is someone available 24/7 at the same number. If you need medication refills, please notify your pharmacy one week in advance and they will send [place] a request.   Thank you for letting us take part in your care. We look forward to seeing you next time!

## 2021-10-01 MED ORDER — DICLOFENAC SODIUM 1 % EX GEL: 2.0000 g | Freq: Four times a day (QID) | CUTANEOUS | 2 refills | Status: DC

## 2021-10-01 MED ORDER — CYCLOBENZAPRINE HCL 10 MG PO TABS: 10.0000 mg | ORAL_TABLET | Freq: Every day | ORAL | 0 refills | Status: DC

## 2021-10-01 NOTE — Addendum Note (Signed)
Addended by: Inda Coke on: 10/01/2021 04:37 PM   Modules accepted: Orders

## 2021-10-01 NOTE — Assessment & Plan Note (Signed)
Patient was persistently low at last hospitalization.  - normal on labs today

## 2021-10-01 NOTE — Addendum Note (Signed)
Addended by: Aldine Contes on: 10/01/2021 02:38 PM   Modules accepted: Level of Service

## 2021-10-01 NOTE — Progress Notes (Signed)
Internal Medicine Clinic Attending  I saw and evaluated the patient.  I personally confirmed the key portions of the history and exam documented by Dr. DeMaio   and I reviewed pertinent patient test results.  The assessment, diagnosis, and plan were formulated together and I agree with the documentation in the resident's note.  

## 2021-10-04 LAB — ALDOSTERONE + RENIN ACTIVITY W/ RATIO
ALDOS/RENIN RATIO: >92.8 — ABNORMAL HIGH (ref 0.0–30.0)
ALDOSTERONE: 15.5 ng/dL (ref 0.0–30.0)
Renin: <0.167 ng/mL/hr — ABNORMAL LOW (ref 0.167–5.380)

## 2021-10-04 LAB — BMP8+ANION GAP
Anion Gap: 16 mmol/L (ref 10.0–18.0)
BUN/Creatinine Ratio: 23 (ref 9–23)
BUN: 16 mg/dL (ref 6–24)
CO2: 27 mmol/L (ref 20–29)
Calcium: 9.2 mg/dL (ref 8.7–10.2)
Chloride: 97 mmol/L (ref 96–106)
Creatinine, Ser: 0.7 mg/dL (ref 0.57–1.00)
Glucose: 140 mg/dL — ABNORMAL HIGH (ref 70–99)
Potassium: 3.7 mmol/L (ref 3.5–5.2)
Sodium: 140 mmol/L (ref 134–144)
eGFR: 103 mL/min/{1.73_m2} (ref >59)

## 2021-10-04 LAB — MAGNESIUM: Magnesium: 2 mg/dL (ref 1.6–2.3)

## 2021-10-05 LAB — CYTOLOGY - PAP
Comment: NEGATIVE
Diagnosis: UNDETERMINED — AB
High risk HPV: NEGATIVE

## 2021-10-05 MED ORDER — SPIRONOLACTONE 25 MG PO TABS: 25.0000 mg | ORAL_TABLET | Freq: Every day | ORAL | 11 refills | Status: DC

## 2021-10-05 NOTE — Addendum Note (Signed)
Addended by: Inda Coke on: 10/05/2021 10:10 AM   Modules accepted: Orders

## 2021-10-22 ENCOUNTER — Ambulatory Visit (INDEPENDENT_AMBULATORY_CARE_PROVIDER_SITE_OTHER): Payer: 59 | Admitting: Internal Medicine

## 2021-10-22 ENCOUNTER — Other Ambulatory Visit: Payer: Self-pay

## 2021-10-22 VITALS — BP 164/105 | HR 89 | Temp 98.3°F | Ht 64.0 in | Wt 157.8 lb

## 2021-10-22 DIAGNOSIS — E782 Mixed hyperlipidemia: Secondary | ICD-10-CM

## 2021-10-22 DIAGNOSIS — E876 Hypokalemia: Secondary | ICD-10-CM

## 2021-10-22 DIAGNOSIS — E785 Hyperlipidemia, unspecified: Secondary | ICD-10-CM | POA: Insufficient documentation

## 2021-10-22 DIAGNOSIS — I10 Essential (primary) hypertension: Secondary | ICD-10-CM | POA: Diagnosis not present

## 2021-10-22 MED ORDER — ATORVASTATIN CALCIUM 40 MG PO TABS: 40.0000 mg | ORAL_TABLET | Freq: Every day | ORAL | 1 refills | Status: DC

## 2021-10-22 MED ORDER — LISINOPRIL 20 MG PO TABS: 20.0000 mg | ORAL_TABLET | Freq: Every day | ORAL | 0 refills | Status: DC

## 2021-10-22 NOTE — Assessment & Plan Note (Addendum)
The 10-year ASCVD risk score (Arnett DK, et al., 2019) is: 23.5%   Values used to calculate the score:     Age: 55 years     Sex: Female     Is Non-Hispanic African American: Yes     Diabetic: Yes     Tobacco smoker: No     Systolic Blood Pressure: 583 mmHg     Is BP treated: Yes     HDL Cholesterol: 43 MG/DL     Total Cholesterol: 168 MG/DL  - Start atorvastatin 40 mg daily

## 2021-10-22 NOTE — Progress Notes (Signed)
   CC: Hypokalemia; hypertension  HPI:  [name] is a 55 y.o. with a PMHx as listed below who presents to the clinic for hypokalemia; hypertension.   Please see the Encounters tab for problem-based Assessment & Plan regarding status of patient's acute and chronic conditions.  Past Medical History:  Diagnosis Date   Acute pharyngitis 03/02/2021   Diabetic foot ulcer associated with type 2 diabetes mellitus ([place]) 01/26/2021   Essential tremor 04/24/2013   Hyperlipidemia    Hypertension    Hypokalemia    Migraine 12/31/2013   T2DM (type 2 diabetes mellitus) ([place]) 09/13/2017   Review of Systems: Review of Systems  Constitutional:  Positive for malaise/fatigue. Negative for chills, fever and weight loss.  Respiratory:  Negative for shortness of breath and wheezing.   Cardiovascular:  Negative for chest pain and palpitations.  Gastrointestinal:  Positive for abdominal pain (epigastric). Negative for nausea and vomiting.  Musculoskeletal:  Positive for myalgias.  Neurological:  Negative for dizziness, focal weakness, weakness and headaches.   Physical Exam:  Vitals:   10/22/21 1320  BP: (!) 164/105  Pulse: 89  Temp: 98.3 F (36.8 C)  TempSrc: Oral  SpO2: 100%  Weight: 157 lb 12.8 oz (71.6 kg)  Height: 5\' 4"  (1.626 m)   Physical Exam Constitutional:      General: She is not in acute distress.    Appearance: She is normal weight.  HENT:     Head: Normocephalic and atraumatic.  Eyes:     Extraocular Movements: Extraocular movements intact.     Pupils: Pupils are equal, round, and reactive to light.  Cardiovascular:     Rate and Rhythm: Normal rate and regular rhythm.     Heart sounds: No murmur heard. Pulmonary:     Effort: Pulmonary effort is normal. No respiratory distress.     Breath sounds: Normal breath sounds. No wheezing or rales.  Abdominal:     General: Bowel sounds are normal.     Palpations: Abdomen is soft.  Musculoskeletal:     Right lower leg: No  edema.     Left lower leg: No edema.  Skin:    General: Skin is warm and dry.  Neurological:     General: No focal deficit present.     Mental Status: She is alert and oriented to person, place, and time. Mental status is at baseline.     Motor: No weakness (5/5 strength in bilateral upper and lower extremities, both proximal and distal).     Coordination: Coordination normal.     Gait: Gait normal.  Psychiatric:        Mood and Affect: Mood normal.        Behavior: Behavior normal.        Thought Content: Thought content normal.        Judgment: Judgment normal.    Assessment & Plan:   See Encounters Tab for problem based charting.  Patient discussed with Dr. Evette Doffing

## 2021-10-22 NOTE — Patient Instructions (Signed)
It was nice seeing you today! Thank you for choosing [facility] for your Primary Care.    Today we talked about:   High blood pressure: We will recheck your potassium levels today.  For now, I am going to increase your Lisinopril first. Take 20 mg every day for 2 weeks. THEN increase to 40 mg daily.   Cholesterol: I have added Atorvastatin to help bring your cholesterol levels down.

## 2021-10-23 NOTE — Assessment & Plan Note (Signed)
Ashley Huynh states that since her potassium supplements were stopped approximately 2 weeks ago, she feels her potassium levels are dropping again. She has her typical symptoms including headache, muscle aches with spasms in her back and epigastric abdominal pain. She endorses some generalized weakness as well.   Assessment/Plan:  Per chart review, patient has a longstanding history of hypokalemia.  It was initially suspected to be due to HCTZ use, however on cessation, hypokalemia did not improve and patient continued to need oral potassium supplementation.  Differential included primary hyperaldosteronism, however results of her blood work is difficult to interpret.  Her serum aldosterone level is elevated at 15 and her aldosterone/renin ratio is over 90, however this is in the setting of undetectable renin levels.  She does meet criteria for confirmatory testing, however this would be extremely difficult and potentially unsafe given her uncontrolled hypertension, even on 3 antihypertensives (although 2 are at low dose).  In addition, she was started empirically on spironolactone and her blood pressure did not improve significantly as would be expected with primary hyperaldosteronism.  Patient does have a recent history of hypomagnesemia during her hospitalization for hypokalemia, however historically her magnesium levels are normal.  On the differential also included RTA, however on review of previous labs, patient does not have any history of metabolic acidosis or hypochloremia.  Liddle syndrome was also on the differential but unlikely given lack of lack of metabolic alkalosis and elevated aldosterone.   Overall, the etiology of hypokalemia at this time is unknown.  We will hold off on restarting potassium supplementation until we get results of her repeat BMP today.  - Repeat BMP pending - Maximizing lisinopril today with goals to normalize potassium levels.  If lisinopril is inadequate to improve  potassium and we continue to have room in her blood pressure parameters, will start increasing her spironolactone. -Patient expressed significant frustration regarding the lack of a definitive diagnosis as a cause of her hypokalemia.  If this continues to be a concern for her, could consider referral to Nephrology

## 2021-10-23 NOTE — Assessment & Plan Note (Signed)
BP: (!) 164/105  Ashley Huynh comes in today for follow-up regarding her hypertension.  At her last visit spironolactone 25 mg daily was added.  She is taking amlodipine 10 mg daily and lisinopril 10 mg daily as well.    Assessment/plan: Blood pressure remains above goal and has essentially unchanged since starting Spironolactone.  On review of her labs at her last visit, serum aldosterone was elevated at 15, serum renin was essentially undetectable, and ratio of aldosterone/renin was over 90.  Given that patient's renin was significantly suppressed, it is difficult to interpret these results.  She does meet criteria for confirmatory testing, however would pose significant risk given uncontrolled hypertension.  She has already been started on spironolactone without any improvement in her blood pressure.  Given concomitant hypokalemia, will start by maximizing Lisinopril. If no response, will then increase Spironolactone while monitoring electrolytes very carefully.   - Increase lisinopril to 20 mg daily.  After 2 weeks, increase to 40 mg daily - Continue spironolactone 25 mg daily - Continue amlodipine 10 mg daily - 4-week follow-up - BMP pending

## 2021-10-24 LAB — BMP8+ANION GAP
Anion Gap: 14 mmol/L (ref 10.0–18.0)
BUN/Creatinine Ratio: 18 (ref 9–23)
BUN: 13 mg/dL (ref 6–24)
CO2: 29 mmol/L (ref 20–29)
Calcium: 9.8 mg/dL (ref 8.7–10.2)
Chloride: 100 mmol/L (ref 96–106)
Creatinine, Ser: 0.73 mg/dL (ref 0.57–1.00)
Glucose: 88 mg/dL (ref 70–99)
Potassium: 3.8 mmol/L (ref 3.5–5.2)
Sodium: 143 mmol/L (ref 134–144)
eGFR: 98 mL/min/{1.73_m2} (ref >59)

## 2021-10-26 NOTE — Progress Notes (Signed)
Internal Medicine Clinic Attending  Case discussed with Dr. Basaraba  At the time of the visit.  We reviewed the resident's history and exam and pertinent patient test results.  I agree with the assessment, diagnosis, and plan of care documented in the resident's note.  

## 2021-11-16 ENCOUNTER — Other Ambulatory Visit: Payer: Self-pay

## 2021-11-16 ENCOUNTER — Encounter: Payer: Self-pay | Admitting: Internal Medicine

## 2021-11-16 ENCOUNTER — Ambulatory Visit (INDEPENDENT_AMBULATORY_CARE_PROVIDER_SITE_OTHER): Payer: 59 | Admitting: Internal Medicine

## 2021-11-16 VITALS — BP 141/94 | HR 87 | Temp 98.1°F | Ht 61.0 in | Wt 160.1 lb

## 2021-11-16 DIAGNOSIS — M62838 Other muscle spasm: Secondary | ICD-10-CM | POA: Insufficient documentation

## 2021-11-16 DIAGNOSIS — E876 Hypokalemia: Secondary | ICD-10-CM | POA: Diagnosis not present

## 2021-11-16 DIAGNOSIS — E119 Type 2 diabetes mellitus without complications: Secondary | ICD-10-CM | POA: Diagnosis not present

## 2021-11-16 DIAGNOSIS — I10 Essential (primary) hypertension: Secondary | ICD-10-CM | POA: Diagnosis not present

## 2021-11-16 MED ORDER — SPIRONOLACTONE 25 MG PO TABS: 25.0000 mg | ORAL_TABLET | Freq: Every day | ORAL | 1 refills | Status: DC

## 2021-11-16 MED ORDER — METFORMIN HCL ER 500 MG PO TB24: 1000.0000 mg | ORAL_TABLET | Freq: Two times a day (BID) | ORAL | 1 refills | Status: DC

## 2021-11-16 MED ORDER — LISINOPRIL 20 MG PO TABS: 20.0000 mg | ORAL_TABLET | Freq: Every day | ORAL | 1 refills | Status: DC

## 2021-11-16 NOTE — Assessment & Plan Note (Signed)
Potassium was rechecked at patient's last visit and showed improvement up to 3.8.  At that time, lisinopril was uptitrated.  We will recheck a BMP today to see if there has been any further improvement.  If potassium remains low, will consider increasing lisinopril further.  - BMP pending

## 2021-11-16 NOTE — Assessment & Plan Note (Signed)
BP: (!) 141/94  Mrs. Tawney states she has been taking Lisinopril 20 mg, 1 tablet daily only. She is also taking Amlodipine and Spironolactone. She is feeling overall better on her current regimen.   Assessment/Plan:  BP improved significantly today compared to last visit. Given she is going to [place] for the next month, will hold off on additional medication titration although her BP goal is <130/90. Once she returns, will consider continued titration of Lisinopril.   - Continue Amlodipine, Spironolactone and Lisinopril without any changes today.  - Follow up in 6 weeks.

## 2021-11-16 NOTE — Assessment & Plan Note (Signed)
Ashley Huynh reports 3 days of left neck, upper back and shoulder pain that started without any particular recurring event.  She believes it may have been due to the cold though.  She denies any heavy lifting.  She denies any focal weakness, numbness, tingling.  Assessment/plan: Significant tenderness to palpation overlying muscle spasm in the left trapezius.  No red flags at this time.    - Recommended conservative management with massage, heat, and topical medication (Salonpas, Tiger balm, etc)

## 2021-11-16 NOTE — Progress Notes (Signed)
   CC: HTN; hypokalemia; back pain  HPI:  [name] is a 55 y.o. with a PMHx as listed below who presents to the clinic for HTN; hypokalemia; back pain.   Please see the Encounters tab for problem-based Assessment & Plan regarding status of patient's acute and chronic conditions.  Past Medical History:  Diagnosis Date   Acute pharyngitis 03/02/2021   Diabetic foot ulcer associated with type 2 diabetes mellitus ([place]) 01/26/2021   Essential tremor 04/24/2013   Hyperlipidemia    Hypertension    Hypokalemia    Migraine 12/31/2013   T2DM (type 2 diabetes mellitus) ([place]) 09/13/2017   Review of Systems: Review of Systems  Constitutional:  Negative for chills, fever, malaise/fatigue and weight loss.  Cardiovascular:  Negative for chest pain and palpitations.  Gastrointestinal:  Negative for abdominal pain, diarrhea, nausea and vomiting.  Musculoskeletal:  Positive for back pain and myalgias. Negative for falls and joint pain.  Neurological:  Negative for dizziness and focal weakness.   Physical Exam:  Vitals:   11/16/21 0946 11/16/21 1039  BP: (!) 147/84 (!) 141/94  Pulse: 87   Temp: 98.1 F (36.7 C)   TempSrc: Oral   SpO2: 100%   Weight: 160 lb 1.6 oz (72.6 kg)   Height: 5\' 1"  (1.549 m)    Physical Exam Vitals and nursing note reviewed.  Constitutional:      General: She is not in acute distress.    Appearance: She is normal weight.  HENT:     Head: Normocephalic and atraumatic.  Pulmonary:     Effort: Pulmonary effort is normal. No respiratory distress.  Abdominal:     Palpations: Abdomen is soft.  Musculoskeletal:     Left shoulder: No swelling, deformity or bony tenderness. Normal range of motion. Normal strength.     Comments: Tenderness to palpation with muscle spasm involving the left trapezius   Skin:    General: Skin is warm and dry.  Neurological:     General: No focal deficit present.     Mental Status: She is alert and oriented to person, place, and  time. Mental status is at baseline.  Psychiatric:        Mood and Affect: Mood normal.        Behavior: Behavior normal.        Thought Content: Thought content normal.        Judgment: Judgment normal.    Assessment & Plan:   See Encounters Tab for problem based charting.  Patient discussed with Dr. Daryll Drown

## 2021-11-16 NOTE — Patient Instructions (Addendum)
It was nice seeing you today! Thank you for choosing [facility] for your Primary Care.    Today we talked about:   Blood Pressure: Your blood pressure improved from your last visit! Let's keep the medications you are on without any changes. We will recheck your potassium today.  Continue Amlodipine 10 mg daily  Continue Lisinopril 20 mg daily  Continue Spironolactone 25 mg daily   Neck and shoulder pain: You are experiencing a muscle spasm in the Trapezius muscle. To help improve the pain, I recommend massaging the area for 10-15 minutes a day, applying a heating pad and uses topical medication patches.   I have refilled all your medications with 90 day refills so you do not run out during your trip.  -------------------------------------------------- ???? ?????? ?????! ????? ???????? [facility] ??????? ???????.  ?????? ????? ??:  1. ??? ????: ???? ??? ???? ?? ?????? ???????! ???? ????? ??? ??????? ???? ???????? ??? ?? ???????. ????? ??? ?????????? ?????. ?. ????? ????????? 10 ??? ?????? ?. ??? ????????? ?? ????? ?????????? 20 ??? ?????? ?. ????? ????? ????????????? 25 ??? ??????  2. ???? ?????? ??????: ??? ????? ?? ???? ???? ?? ?????? ??? ????????. ???????? ?? ????? ????? ? ???? ?????? ??????? ???? 10-15 ????? ?????? ? ?????? ????? ????? ???????? ?????? ???? ??????.  . ??? ??? ?????? ????? ???? ??????? ?????? ?? ?? ????? ????? 90 ????? ??? ?? ???? ??? ????? ?????.

## 2021-11-16 NOTE — Assessment & Plan Note (Signed)
Not due for A1c. Refilled Metformin

## 2021-11-17 LAB — BMP8+ANION GAP
Anion Gap: 19 mmol/L — ABNORMAL HIGH (ref 10.0–18.0)
BUN/Creatinine Ratio: 15 (ref 9–23)
BUN: 13 mg/dL (ref 6–24)
CO2: 23 mmol/L (ref 20–29)
Calcium: 9.7 mg/dL (ref 8.7–10.2)
Chloride: 102 mmol/L (ref 96–106)
Creatinine, Ser: 0.85 mg/dL (ref 0.57–1.00)
Glucose: 153 mg/dL — ABNORMAL HIGH (ref 70–99)
Potassium: 3.7 mmol/L (ref 3.5–5.2)
Sodium: 144 mmol/L (ref 134–144)
eGFR: 82 mL/min/{1.73_m2} (ref >59)

## 2021-11-18 NOTE — Progress Notes (Signed)
Electrolytes and renal function stable. No change in regimen. Patient's daughter informed of results.

## 2021-11-20 ENCOUNTER — Telehealth: Payer: Self-pay | Admitting: Internal Medicine

## 2021-11-20 NOTE — Telephone Encounter (Signed)
#  90 with 1 refill sent 11/28 with confirmed receipt from Pharmacy. Spoke with Monique at [facility]. States patient p/u 30 day supply on 11/27 so insurance will not cover 90 day supply at this time. She will have to pay cash price. Patient's daughter, Lenard Galloway notified. She was very Patent attorney.

## 2021-11-20 NOTE — Telephone Encounter (Signed)
MED REFILL REQUEST  spironolactone (ALDACTONE) 25 MG tablet   [place], [place]. Phone:  [phone]  Fax:  [phone]      Patient will be traveling out the country tomorrow and will be out of her medicines.  Forwarding high priority to triage nurse.

## 2021-11-26 NOTE — Progress Notes (Signed)
Internal Medicine Clinic Attending  Case discussed with Dr. Basaraba  At the time of the visit.  We reviewed the resident's history and exam and pertinent patient test results.  I agree with the assessment, diagnosis, and plan of care documented in the resident's note.  

## 2022-03-08 ENCOUNTER — Other Ambulatory Visit: Payer: Self-pay

## 2022-03-08 ENCOUNTER — Ambulatory Visit (INDEPENDENT_AMBULATORY_CARE_PROVIDER_SITE_OTHER): Payer: 59 | Admitting: Podiatry

## 2022-03-08 DIAGNOSIS — M67472 Ganglion, left ankle and foot: Secondary | ICD-10-CM | POA: Diagnosis not present

## 2022-03-08 MED ORDER — BETAMETHASONE SOD PHOS & ACET 6 (3-3) MG/ML IJ SUSP
3.0000 mg | Freq: Once | INTRAMUSCULAR | Status: AC
  Administered 2022-03-08: 3 mg via INTRA_ARTICULAR

## 2022-03-08 NOTE — Progress Notes (Signed)
? ?  HPI: 56 y.o. female presenting today PMHx DM type II for follow-up evaluation of pain and tenderness to the bilateral feet.  She states that it is very painful with weightbearing.  She presents for further treatment evaluation.   ? ? ?Past Medical History:  ?Diagnosis Date  ? Acute pharyngitis 03/02/2021  ? Diabetic foot ulcer associated with type 2 diabetes mellitus ([place]) 01/26/2021  ? Essential tremor 04/24/2013  ? Hyperlipidemia   ? Hypertension   ? Hypokalemia   ? Migraine 12/31/2013  ? T2DM (type 2 diabetes mellitus) ([place]) 09/13/2017  ? ?Past Surgical History:  ?Procedure Laterality Date  ? EXTERNAL EAR SURGERY    ? right  ? ?Allergies  ?Allergen Reactions  ? Other Other (See Comments)  ?  No pork ?No pork  ? ?Objective: ?Physical Exam ?General: The patient is alert and oriented x3 in no acute distress. ? ?Dermatology: Skin is cool, dry and supple bilateral lower extremities. Negative for open lesions or macerations. ? ?Vascular: Palpable pedal pulses bilaterally. No edema or erythema noted. Capillary refill within normal limits. ? ?Neurological: Epicritic and protective threshold diminished bilaterally.  ? ?Musculoskeletal Exam: All pedal and ankle joints range of motion within normal limits bilateral. Muscle strength 5/5 in all groups bilateral. Hammertoe contracture deformity noted to digits 2-5 of the bilateral foot.  There is associated tenderness palpation also to the fifth digits bilateral ?Not adhered small palpable fluctuant lesion noted to the left lateral forefoot overlying the fifth EDL tendon with associated tenderness to palpation ? ? ?Assessment: ?1.  Diabetes mellitus with peripheral polyneuropathy ?2.  Hammertoe deformity 2-5 bilateral ?3.  Capsulitis fifth digits bilateral ?4.  Ganglion cyst left lateral forefoot ? ? ?Plan of Care:  ?1. Patient evaluated. ?2.  Injection of 0.5 cc Celestone Soluspan injected into the ganglion on tendon and tendon sheath of the lateral aspect of the left  forefoot ?3.  Continue wide fitting shoes and sandals that do not constrict the toebox or rub against the fifth toe ?4.  Prescription for meloxicam 15 mg daily as needed ?5.  Return to clinic as needed ? ?Edrick Kins, DPM ?[place] ? ?Dr. Edrick Kins, DPM  ?  ?[address]                                        ?[place], [place] [address]                ?Office [phone]  ?Fax [phone] ? ? ? ? ? ?

## 2022-03-09 ENCOUNTER — Encounter: Payer: Self-pay | Admitting: Internal Medicine

## 2022-03-10 ENCOUNTER — Other Ambulatory Visit: Payer: Self-pay

## 2022-03-10 ENCOUNTER — Ambulatory Visit (INDEPENDENT_AMBULATORY_CARE_PROVIDER_SITE_OTHER): Payer: 59 | Admitting: Internal Medicine

## 2022-03-10 ENCOUNTER — Encounter: Payer: Self-pay | Admitting: Internal Medicine

## 2022-03-10 VITALS — BP 131/82 | HR 75 | Temp 97.5°F | Ht 63.0 in | Wt 160.1 lb

## 2022-03-10 DIAGNOSIS — R634 Abnormal weight loss: Secondary | ICD-10-CM

## 2022-03-10 DIAGNOSIS — E119 Type 2 diabetes mellitus without complications: Secondary | ICD-10-CM

## 2022-03-10 DIAGNOSIS — I10 Essential (primary) hypertension: Secondary | ICD-10-CM | POA: Diagnosis not present

## 2022-03-10 DIAGNOSIS — E876 Hypokalemia: Secondary | ICD-10-CM | POA: Diagnosis not present

## 2022-03-10 LAB — POCT GLYCOSYLATED HEMOGLOBIN (HGB A1C): Hemoglobin A1C: 6.4 % — AB (ref 4.0–5.6)

## 2022-03-10 LAB — GLUCOSE, CAPILLARY: Glucose-Capillary: 248 mg/dL — ABNORMAL HIGH (ref 70–99)

## 2022-03-10 MED ORDER — ATORVASTATIN CALCIUM 40 MG PO TABS: 40.0000 mg | ORAL_TABLET | Freq: Every day | ORAL | 1 refills | Status: DC

## 2022-03-10 MED ORDER — METFORMIN HCL ER (MOD) 1000 MG PO TB24: 1000.0000 mg | ORAL_TABLET | Freq: Two times a day (BID) | ORAL | 3 refills | Status: DC

## 2022-03-10 MED ORDER — SPIRONOLACTONE 25 MG PO TABS: 25.0000 mg | ORAL_TABLET | Freq: Every day | ORAL | 1 refills | Status: DC

## 2022-03-10 MED ORDER — AMLODIPINE BESYLATE 10 MG PO TABS: 10.0000 mg | ORAL_TABLET | Freq: Every day | ORAL | 3 refills | Status: DC

## 2022-03-10 MED ORDER — LISINOPRIL 20 MG PO TABS: 20.0000 mg | ORAL_TABLET | Freq: Every day | ORAL | 1 refills | Status: DC

## 2022-03-10 NOTE — Assessment & Plan Note (Addendum)
-   Reevaluate potassium levels today ?

## 2022-03-10 NOTE — Assessment & Plan Note (Signed)
Ashley Huynh denies any concerns regarding her medications at this time other than wondering if it is contributing to her weight loss.  She denies any dizziness, chest pain, shortness of breath, lower extremity swelling. ? ?Assessment/plan: ?BP: 131/82  ? ?Blood pressure today at goal.  Of note, patient is on spironolactone due to hypokalemia with potential hyperaldosteronism, however inability to perform confirmatory testing. ? ?- Continue lisinopril 20 mg daily, amlodipine 10 mg daily, and spironolactone 25 mg daily ?

## 2022-03-10 NOTE — Assessment & Plan Note (Signed)
Lab Results  ?Component Value Date  ? HGBA1C 6.4 (A) 03/10/2022  ? ?Ashley Huynh states that she takes metformin twice daily as instructed but occasionally misses doses every to 3 days.  She denies any polyuria or polydipsia.  She is not currently checking her sugars at home. ? ?Assessment/plan: ?A1c evaluated today and demonstrates improvement to 6.4%.  We will continue with current management. ? ?- Metformin 1000 mg twice daily ?- Continue atorvastatin 40 mg daily ?- Repeat A1c in 3 months ?

## 2022-03-10 NOTE — Progress Notes (Signed)
? ?CC: HTN; T2DM; hypokalemia; weight loss ? ?HPI: ? ?Ashley Huynh is a 56 y.o. with a PMHx as listed below who presents to the clinic for HTN; T2DM; hypokalemia; weight loss.  ? ?Please see the Encounters tab for problem-based Assessment & Plan regarding status of patient's acute and chronic conditions. ? ?Past Medical History:  ?Diagnosis Date  ? Acute pharyngitis 03/02/2021  ? Diabetic foot ulcer associated with type 2 diabetes mellitus ([place]) 01/26/2021  ? Essential tremor 04/24/2013  ? Hyperlipidemia   ? Hypertension   ? Hypokalemia   ? Migraine 12/31/2013  ? T2DM (type 2 diabetes mellitus) ([place]) 09/13/2017  ? ?Review of Systems: Review of Systems  ?Constitutional:  Positive for weight loss. Negative for chills, diaphoresis, fever and malaise/fatigue.  ?Respiratory:  Negative for cough, shortness of breath and wheezing.   ?Cardiovascular:  Negative for chest pain, palpitations and leg swelling.  ?Gastrointestinal:  Positive for abdominal pain and nausea. Negative for blood in stool, constipation, diarrhea, heartburn, melena and vomiting.  ?Genitourinary:  Negative for dysuria, frequency, hematuria and urgency.  ?Musculoskeletal:  Negative for back pain, joint pain, myalgias and neck pain.  ?Skin:  Negative for itching and rash.  ?Neurological:  Negative for dizziness, tingling, sensory change, focal weakness, loss of consciousness, weakness and headaches.  ?Psychiatric/Behavioral:  Negative for depression. The patient is not nervous/anxious.   ? ?Physical Exam: ? ?Vitals:  ? 03/10/22 1404 03/10/22 1412  ?BP: 139/85 131/82  ?Pulse: 76 75  ?Temp: (!) 97.5 ?F (36.4 ?C)   ?TempSrc: Oral   ?SpO2: 100%   ?Weight: 160 lb 1.6 oz (72.6 kg)   ?Height: '5\' 3"'$  (1.6 m)   ? ?Physical Exam ?Vitals and nursing note reviewed.  ?Constitutional:   ?   Appearance: She is overweight.  ?HENT:  ?   Head: Normocephalic and atraumatic.  ?   Mouth/Throat:  ?   Mouth: Mucous membranes are moist.  ?   Pharynx: Oropharynx is clear.  ?Eyes:   ?   Conjunctiva/sclera: Conjunctivae normal.  ?   Pupils: Pupils are equal, round, and reactive to light.  ?Cardiovascular:  ?   Rate and Rhythm: Normal rate and regular rhythm.  ?   Heart sounds: No murmur heard. ?  No gallop.  ?Pulmonary:  ?   Effort: Pulmonary effort is normal. No respiratory distress.  ?   Breath sounds: Normal breath sounds. No wheezing, rhonchi or rales.  ?Abdominal:  ?   General: Bowel sounds are normal. There is no distension.  ?   Palpations: Abdomen is soft.  ?   Tenderness: There is no abdominal tenderness.  ?Musculoskeletal:  ?   Cervical back: Normal range of motion and neck supple.  ?   Right lower leg: No edema.  ?   Left lower leg: No edema.  ?Skin: ?   General: Skin is warm and dry.  ?   Findings: No lesion or rash.  ?Neurological:  ?   General: No focal deficit present.  ?   Mental Status: She is alert and oriented to person, place, and time. Mental status is at baseline.  ?Psychiatric:     ?   Mood and Affect: Mood normal.     ?   Behavior: Behavior normal.     ?   Thought Content: Thought content normal.     ?   Judgment: Judgment normal.  ? ? ?Assessment & Plan:  ? ?See Encounters Tab for problem based charting. ? ?Patient discussed with Dr.  Saverio Danker ? ?

## 2022-03-10 NOTE — Assessment & Plan Note (Signed)
Ashley Huynh states that she has noticed significant weight loss over the last year and this is quite bothersome for her.  She denies any increase in exercise or any dietary changes.  She endorses heat intolerance but denies any night sweats or diaphoresis.  She denies fever, chills, fatigue, abdominal bloating, constipation, diarrhea, melena, hematochezia, numbness/tingling, chest pain, shortness of breath, lower extremity swelling.  She denies any known liver dysfunction.   ? ?She states that she has been experiencing left upper quadrant abdominal pain that was initially only present with eating but in the last few days has become present intermittently without eating as well.  The pain does not radiate anywhere else.  She endorses associated nausea but no vomiting. ? ?Assessment/plan: ?Per chart review, approximately 8% weight loss over the last 1 year.  Weight has been stable between her last 2 visits though at 160 pounds.  She is up-to-date on age-appropriate cancer screening including pap smear, colonoscopy, and mammogram.  Given her LUQ abdominal symptoms and frequent out of country travel, I do wonder about the possibility of H. Pylori. ? ?We will start evaluation today with blood work and stool test, however if negative will consider referral to GI for consideration of endoscopy. ? ?- CBC, CMP, TSH, HIV, H. pylori stool antigen test pending ?- Follow-up in 4 weeks for weight recheck ?

## 2022-03-10 NOTE — Patient Instructions (Addendum)
It was nice seeing you today! Thank you for choosing [facility] for your Primary Care.  ?  ?Today we talked about:  ? ?Blood pressure: Your blood pressure today is great. No medication changes were made.  ?I do not suspect your blood pressure medications are having any role in the weight loss you are experiencing.  ? ?Diabetes: Your A1c today has improved to 6.4%!! Great work. Continue taking Metformin. I combined your pill so you only have to take 1 tablet with each meal.  ?During Ramadan, take your first dose with your first meal and the second dose with your last meal ? ?Weight loss: I am not certain to the case just yet. We are starting the work up with blood work. I will call you with the results. Let's follow up in 6 weeks to recheck your weight.  ?------------------------------------------------ ????? ?????? ?????! ????? ???????? [facility] ??????? ???????. ? ??????? ????? ??: ? ?1. ??? ????: ??? ??? ????? ????. ?? ??? ????? ?? ??????? ?? ???????. ??. ?? ??? ?? ?? ?????? ??? ???? ?? ??? ?? ????? ????? ???? ????? ???. ? ?2. ??? ??????: ??? ???? ????? A1c ????? ?? ????? ??? 6.4? !! ??? ????. ????? ?? ????? ???????????. ??? ???? ???? ??? ????? ?????? ?? ? ??? ???? ??? ?? ???? ????? ?????? ?? ?? ????. ??. ???? ??? ????? ? ????? ????? ?????? ?? ????? ?????? ??????? ??????? ?? ????? ??????? ? ?3. ????? ?????: ??? ?????? ?? ??? ?????? ??? ????. ??? ???? ????? ???? ????. ????? ?? ?? ???????. ???? ????? ?? 6 ?????? ?????? ??? ????. ?

## 2022-03-11 ENCOUNTER — Telehealth: Payer: Self-pay | Admitting: *Deleted

## 2022-03-11 LAB — CMP14 + ANION GAP
ALT: 11 IU/L (ref 0–32)
AST: 14 IU/L (ref 0–40)
Albumin/Globulin Ratio: 1.6 (ref 1.2–2.2)
Albumin: 4.5 g/dL (ref 3.8–4.9)
Alkaline Phosphatase: 81 IU/L (ref 44–121)
Anion Gap: 18 mmol/L (ref 10.0–18.0)
BUN/Creatinine Ratio: 26 — ABNORMAL HIGH (ref 9–23)
BUN: 18 mg/dL (ref 6–24)
Bilirubin Total: <0.2 mg/dL (ref 0.0–1.2)
CO2: 27 mmol/L (ref 20–29)
Calcium: 9.9 mg/dL (ref 8.7–10.2)
Chloride: 96 mmol/L (ref 96–106)
Creatinine, Ser: 0.69 mg/dL (ref 0.57–1.00)
Globulin, Total: 2.9 g/dL (ref 1.5–4.5)
Glucose: 170 mg/dL — ABNORMAL HIGH (ref 70–99)
Potassium: 3.5 mmol/L (ref 3.5–5.2)
Sodium: 141 mmol/L (ref 134–144)
Total Protein: 7.4 g/dL (ref 6.0–8.5)
eGFR: 103 mL/min/{1.73_m2} (ref >59)

## 2022-03-11 LAB — CBC WITH DIFFERENTIAL/PLATELET
Basophils Absolute: 0.1 10*3/uL (ref 0.0–0.2)
Basos: 1 %
EOS (ABSOLUTE): 0 10*3/uL (ref 0.0–0.4)
Eos: 1 %
Hematocrit: 44.1 % (ref 34.0–46.6)
Hemoglobin: 14.7 g/dL (ref 11.1–15.9)
Immature Grans (Abs): 0 10*3/uL (ref 0.0–0.1)
Immature Granulocytes: 0 %
Lymphocytes Absolute: 3.1 10*3/uL (ref 0.7–3.1)
Lymphs: 43 %
MCH: 28.2 pg (ref 26.6–33.0)
MCHC: 33.3 g/dL (ref 31.5–35.7)
MCV: 85 fL (ref 79–97)
Monocytes Absolute: 0.5 10*3/uL (ref 0.1–0.9)
Monocytes: 8 %
Neutrophils Absolute: 3.4 10*3/uL (ref 1.4–7.0)
Neutrophils: 47 %
Platelets: 331 10*3/uL (ref 150–450)
RBC: 5.22 x10E6/uL (ref 3.77–5.28)
RDW: 13.5 % (ref 11.7–15.4)
WBC: 7.1 10*3/uL (ref 3.4–10.8)

## 2022-03-11 LAB — TSH: TSH: 1.85 u[IU]/mL (ref 0.450–4.500)

## 2022-03-11 LAB — HIV ANTIBODY (ROUTINE TESTING W REFLEX): HIV Screen 4th Generation wRfx: NONREACTIVE

## 2022-03-11 MED ORDER — METFORMIN HCL ER 500 MG PO TB24: 1000.0000 mg | ORAL_TABLET | Freq: Two times a day (BID) | ORAL | 1 refills | Status: DC

## 2022-03-11 NOTE — Telephone Encounter (Signed)
Received fax from [facility] stating Glumetza 1000 mg costs $21,000 since pt does not have insurance. Requesting med change to Metformin ER 500 mg. ?Thanks ?

## 2022-03-11 NOTE — Addendum Note (Signed)
Addended by: Jose Persia on: 03/11/2022 02:21 PM ? ? Modules accepted: Orders ? ?

## 2022-03-12 NOTE — Progress Notes (Signed)
Internal Medicine Clinic Attending  Case discussed with Dr. Basaraba  At the time of the visit.  We reviewed the resident's history and exam and pertinent patient test results.  I agree with the assessment, diagnosis, and plan of care documented in the resident's note.  

## 2022-03-15 ENCOUNTER — Telehealth: Payer: Self-pay | Admitting: Internal Medicine

## 2022-03-15 NOTE — Telephone Encounter (Signed)
Attempted to contact to discuss lab results. VM left. ?

## 2022-03-17 ENCOUNTER — Telehealth: Payer: Self-pay

## 2022-03-17 NOTE — Telephone Encounter (Signed)
Patient called regarding her lab results call back:[phone] ?

## 2022-03-18 NOTE — Progress Notes (Signed)
Results negative for abnormalities to explain weight loss over the last year. I updated Ashley Huynh's daughter as requested. She will discuss with her mother whether they would like to pursue GI referral to endoscopy.

## 2022-03-22 ENCOUNTER — Other Ambulatory Visit: Payer: 59

## 2022-03-22 DIAGNOSIS — R634 Abnormal weight loss: Secondary | ICD-10-CM

## 2022-03-22 NOTE — Addendum Note (Signed)
Addended by: Truddie Crumble on: 03/22/2022 10:20 AM ? ? Modules accepted: Orders ? ?

## 2022-03-24 LAB — H. PYLORI ANTIGEN, STOOL: H pylori Ag, Stl: NEGATIVE

## 2022-03-30 NOTE — Progress Notes (Signed)
H. Pylori testing negative. As previously noted, patient's daughter will discuss with her mother whether they would like to pursue GI referral to endoscopy and call the clinic to let us know.

## 2022-04-08 ENCOUNTER — Other Ambulatory Visit: Payer: Self-pay | Admitting: Internal Medicine

## 2022-04-08 DIAGNOSIS — M25562 Pain in left knee: Secondary | ICD-10-CM

## 2022-04-20 ENCOUNTER — Ambulatory Visit (INDEPENDENT_AMBULATORY_CARE_PROVIDER_SITE_OTHER): Payer: 59 | Admitting: Internal Medicine

## 2022-04-20 ENCOUNTER — Encounter: Payer: Self-pay | Admitting: Internal Medicine

## 2022-04-20 VITALS — BP 169/113 | HR 79 | Temp 98.0°F | Ht 62.0 in | Wt 155.6 lb

## 2022-04-20 DIAGNOSIS — J029 Acute pharyngitis, unspecified: Secondary | ICD-10-CM

## 2022-04-20 NOTE — Patient Instructions (Addendum)
To Ms. Lashley,  ? ?It was a pleasure seeing you today. Today we discussed your throat soreness. Your symptoms are likely due to a virus. We have swabbed for COVID today, and I will call you back with these results. I would recommend plenty of fluids, Ibuprofen or Aleve, and obtaining a throat spray over the counter called Chloraseptic spray. Please continue these measures. If your symptoms worsen or do not improve please call us back for another visit.  ? ?Today your blood pressure was elevated, likely secondary to your throat pain and not taking your medications this morning. We will have you follow back in 2 weeks for a blood pressure check.  ? ?Have a good day,  ?Maudie Mercury, MD ?

## 2022-04-20 NOTE — Progress Notes (Signed)
?  [facility] Telephone Encounter ?Continuity Care Appointment ? ?HPI:  ?This telephone encounter was created for [name] on 04/20/2022 for the following purpose/cc Sore Throat.  ? ?Past Medical History:  ?Past Medical History:  ?Diagnosis Date  ? Acute pharyngitis 03/02/2021  ? Diabetic foot ulcer associated with type 2 diabetes mellitus ([place]) 01/26/2021  ? Essential tremor 04/24/2013  ? Hyperlipidemia   ? Hypertension   ? Hypokalemia   ? Migraine 12/31/2013  ? T2DM (type 2 diabetes mellitus) ([place]) 09/13/2017  ?  ? ?ROS:  ?Review of Systems  ?Constitutional:  Positive for chills, diaphoresis and fever. Negative for malaise/fatigue.  ?HENT:  Negative for ear discharge, ear pain and tinnitus.   ?Respiratory:  Negative for cough, hemoptysis and shortness of breath.   ?Cardiovascular:  Negative for chest pain, palpitations and orthopnea.  ?Gastrointestinal:  Positive for nausea. Negative for abdominal pain, constipation, diarrhea and vomiting.  ?Musculoskeletal:  Positive for myalgias. Negative for back pain and neck pain.  ?Skin:  Negative for itching and rash.  ?Neurological:  Positive for headaches.    ? ?Assessment / Plan / Recommendations:  ?Please see A&P under problem oriented charting for assessment of the patient's acute and chronic medical conditions.  ?As always, pt is advised that if symptoms worsen or new symptoms arise, they should go to an urgent care facility or to to ER for further evaluation.  ? ?Consent and Medical Decision Making:  ?Patient discussed with Dr. Jimmye Norman ?This is a telephone encounter between Ashley Huynh and Maudie Mercury on 04/20/2022 for sore throat. The visit was conducted with the patient located at home and Maudie Mercury at [facility]. The patient's identity was confirmed using their DOB and current address. The patient has consented to being evaluated through a telephone encounter and understands the associated risks (an examination cannot be done and  the patient may need to come in for an appointment) / benefits (allows the patient to remain at home, decreasing exposure to coronavirus). I personally spent 15 minutes on medical discussion.   ?  ?

## 2022-04-20 NOTE — Assessment & Plan Note (Signed)
Ashley Huynh presents for telehealth visit for having a sore throat, dry mouth, subjective fevers, myalgias, throat itching, nausea, and difficulty swallowing her food secondary to pain. She states that her symptoms started approximately a week ago, but have gotten worse over the past several days.  Patient states that she has tried Coricidin, but this has not alleviated his symptoms.  She was exposed to sick contact, her daughter, who was sick approximately 2 weeks ago, until she went to her pediatrician, had a throat swab, and was given antibiotics.  She got better over the next several days.  Shortly after her daughter was taken to the pediatrician, Ashley Huynh began to experience urinary symptoms.  She states that the pain with swallowing has worsened over the past 2 days, and her objective fevers have worsened as well.  She additionally notes that there is a swollen lump on the outside of her mouth. ? ?A/P: ?Patient presents for telehealth visit with symptoms concerning for acute pharyngitis.  Unfortunately given the language barrier it is difficult to ascertain if this has been going on for 2 weeks, 1 week, over the past 3 days.  Discussed with the patient that she should come to clinic for evaluation given her symptoms. ?- Patient to be seen in clinic at 10:45 AM ? ? ? ?

## 2022-04-21 LAB — NOVEL CORONAVIRUS, NAA: SARS-CoV-2, NAA: NOT DETECTED

## 2022-04-22 ENCOUNTER — Ambulatory Visit (INDEPENDENT_AMBULATORY_CARE_PROVIDER_SITE_OTHER): Payer: 59 | Admitting: Internal Medicine

## 2022-04-22 ENCOUNTER — Other Ambulatory Visit: Payer: Self-pay

## 2022-04-22 DIAGNOSIS — J029 Acute pharyngitis, unspecified: Secondary | ICD-10-CM | POA: Diagnosis not present

## 2022-04-22 DIAGNOSIS — H04129 Dry eye syndrome of unspecified lacrimal gland: Secondary | ICD-10-CM | POA: Insufficient documentation

## 2022-04-22 DIAGNOSIS — I10 Essential (primary) hypertension: Secondary | ICD-10-CM

## 2022-04-22 DIAGNOSIS — E119 Type 2 diabetes mellitus without complications: Secondary | ICD-10-CM

## 2022-04-22 DIAGNOSIS — R634 Abnormal weight loss: Secondary | ICD-10-CM

## 2022-04-22 MED ORDER — SINUS RINSE BOTTLE KIT NA PACK: 1.0000 | PACK | Freq: Two times a day (BID) | NASAL | 0 refills | Status: DC | PRN

## 2022-04-22 MED ORDER — LUBRICANT EYE DROPS 0.4-0.3 % OP SOLN: 1.0000 [drp] | Freq: Two times a day (BID) | OPHTHALMIC | 3 refills | Status: DC | PRN

## 2022-04-22 NOTE — Patient Instructions (Addendum)
Ashley Huynh ? ?It was a pleasure seeing you in the clinic today.  ? ?We talked about your blood pressure, diabetes, and your sore throat. ? ?Diabetes- continue taking your medications ? ?Blood pressure- try and remember to take your medication before your next visit ? ?Sore throat- continue taking tylenol and using cough drops for your sore throat. For the mucous you can try the neti pot. It is over the counter ? ? ? ?Please call our clinic at [phone] if you have any questions or concerns. The best time to call is Monday-Friday from 9am-4pm, but there is someone available 24/7 at the same number. If you need medication refills, please notify your pharmacy one week in advance and they will send [place] a request. ?  ?Thank you for letting us take part in your care. We look forward to seeing you next time! ? ? ? ????? ?????? ? ???? ?? ????? ????? ????? ?? ??????? ?????. ? ??????? ?? ??? ???? ??????? ??????? ?????. ? ???? ?????? - ????? ?? ????? ?????? ? ???? ???? - ???? ????? ????? ?????? ??? ?????? ??????? ? ??????? ????? - ????? ?? ????? ???????? ???????? ????? ?????? ??????? ?????. ??????? ??????? ???????? ? ????? ????? ???? ????. ??? ??? ?????? ? ?  ? ????? ??????? ???????? ??? [phone] ??? ??? ???? ?? ????? ?? ?????. ???? ??? ??????? ?? ?? ??????? ??? ?????? ?? ?????? 9 ?????? ??? 4 ????? ? ???? ???? ??? ???? ??? ???? ?????? ???? ???? ??????? ??? ??? ?????. ??? ??? ????? ??? ????? ????? ??????? ? ????? ????? ???????? ?????? ?? ??? ????? ???? ???? ???? ????? ?????. ? ?????? ?? ??? ?????? ??? ????????? ?? ??????. ??? ????? ??? ????? ?? ????? ???????! ?jadat khujali ?kan min dawaei sururi ruyatuk fi aleiadat alyuma. ?tahadathna ean daght aldam walsukarii waltihab alhalqi. ?da' [place] fi tanawul 'adwiatik ?daght aldam - hawil watadhkur tanawul aldawa' qabl ziaratik alqadima ?altihab alhalq - astamara fi tanawul taylinul wastikhdam qatarat alsueal lialtihab alhalqa. bialnisbat lil'aghshiat  almukhatiat , [place] tajribat waea' niti. 'iinah fawq aleadad ? ?yurjaa aliatisal bieiadatina ealaa [phone] 'iidha kan ladayk 'ayu 'asyilat 'aw makhawifa. 'afdal waqt lilaitisal hu min alaithnayn 'iilaa aljumeat min alsaaeat 9 sbahan hataa 4 msa'an , walakin hunak shakhs mutah ealaa madar alsaaeat tawal 'ayaam al'usbue ealaa nafs alraqmi. 'iidha kunt bihajat 'iilaa 'iieadat taebiat al'adwiat , fayurjaa 'iikhtar alsaydaliat alkhasat bik qabl 'usbue wahid wasawf tursil 'iilayna tlban. ?shkran lak ealaa alsamah lana bialmusharakat fi rieayatika. nahn natatalae 'iilaa ruyatik fi almarat alqadimati! ?

## 2022-04-22 NOTE — Progress Notes (Signed)
? ?  CC: Follow-up telehealth counter ? ?HPI: ? ?Ashley Huynh is a 56 y.o. person, with a PMH noted below, who presents to the clinic follow-up on a telehealth encounter. To see the management of their acute and chronic conditions, please see the A&P note under the Encounters tab.  ? ?Past Medical History:  ?Diagnosis Date  ? Acute pharyngitis 03/02/2021  ? Diabetic foot ulcer associated with type 2 diabetes mellitus ([place]) 01/26/2021  ? Essential tremor 04/24/2013  ? Hyperlipidemia   ? Hypertension   ? Hypokalemia   ? Migraine 12/31/2013  ? T2DM (type 2 diabetes mellitus) ([place]) 09/13/2017  ? ?Review of Systems:   ?Review of Systems  ?Constitutional:  Positive for fever and malaise/fatigue. Negative for chills, diaphoresis and weight loss.  ?HENT:  Positive for sore throat. Negative for ear discharge, ear pain, nosebleeds and sinus pain.   ?Eyes:  Negative for blurred vision, double vision, photophobia and pain.  ?Respiratory:  Negative for cough, sputum production, shortness of breath, wheezing and stridor.   ?Cardiovascular:  Negative for palpitations.  ?Gastrointestinal:  Positive for nausea. Negative for constipation, diarrhea and vomiting.  ?Skin:  Negative for itching and rash.   ? ?Physical Exam: ? ?Vitals:  ? 04/20/22 1058 04/20/22 1104  ?BP: (!) 172/100 (!) 169/113  ?Pulse: 81 79  ?Temp: 98 ?F (36.7 ?C)   ?SpO2: 100%   ?Weight: 155 lb 9.6 oz (70.6 kg)   ?Height: '5\' 2"'$  (1.575 m)   ? ?Physical Exam ?Constitutional:   ?   General: She is not in acute distress. ?   Appearance: She is well-developed. She is not ill-appearing, toxic-appearing or diaphoretic.  ?HENT:  ?   Head: Normocephalic.  ?   Comments: 1 cm tender lymph node in the right anterior cervical chain ?   Nose: Congestion present.  ?   Mouth/Throat:  ?   Mouth: Mucous membranes are moist. No oral lesions.  ?   Pharynx: Uvula midline. Posterior oropharyngeal erythema present. No pharyngeal swelling, oropharyngeal exudate or uvula swelling.  ?    Tonsils: No tonsillar exudate or tonsillar abscesses.  ?Eyes:  ?   Extraocular Movements:  ?   Right eye: Normal extraocular motion.  ?   Left eye: Normal extraocular motion.  ?   Conjunctiva/sclera: Conjunctivae normal.  ?Pulmonary:  ?   Effort: Pulmonary effort is normal.  ?   Breath sounds: Normal breath sounds. No wheezing or rales.  ?Skin: ?   General: Skin is warm.  ?   Coloration: Skin is not pale.  ?   Findings: No erythema or rash.  ?Neurological:  ?   Mental Status: She is alert and oriented to person, place, and time.  ?  ? ?Assessment & Plan:  ? ?See Encounters Tab for problem based charting. ? ?Patient discussed with Dr. Jimmye Norman ? ?

## 2022-04-22 NOTE — Progress Notes (Signed)
? ?  CC: sore throat ? ?HPI: ? ?Ashley Huynh is a 56 y.o. PMH noted below, who presents to the [facility] with complaints of sore throat. To see the management of his acute and chronic conditions, please refer to the A&P note under the encounters tab.  ? ?Past Medical History:  ?Diagnosis Date  ? Acute pharyngitis 03/02/2021  ? Diabetic foot ulcer associated with type 2 diabetes mellitus ([place]) 01/26/2021  ? Essential tremor 04/24/2013  ? Hyperlipidemia   ? Hypertension   ? Hypokalemia   ? Migraine 12/31/2013  ? T2DM (type 2 diabetes mellitus) ([place]) 09/13/2017  ? ?Review of Systems:  positive for sore throat, stuffy nose, and recent fevers ? ?Physical Exam: ?Gen: middle aged woman in NAD ?HEENT: normocephalic atraumatic, mild cobblestoning of the posterior oropharynx, no tonsillar exudates ?CV: RRR, no m/r/g   ?Resp: CTAB, normal WOB  ?GI: soft, nontender ?MSK: moves all extremities without difficulty ?Skin:warm and dry ?Neuro:alert answering questions appropriately ?Psych: normal affect ? ? ?Assessment & Plan:  ? ?See Encounters Tab for problem based charting. ? ?Patient discussed with Dr. Saverio Danker  ? ?

## 2022-04-22 NOTE — Assessment & Plan Note (Signed)
Not due for A1c today, foot exam performed. ?- recheck A1c in about 1 month ?

## 2022-04-22 NOTE — Assessment & Plan Note (Signed)
Recommend over the counter dry eye drops and f/u with her ophthalmologist if symptoms do not improve ?

## 2022-04-22 NOTE — Assessment & Plan Note (Signed)
Improving today, last seen in clinic two days ago and has not had any fevers since then. ?- neti pot information provided ?- continue conservative management ? ?

## 2022-04-22 NOTE — Assessment & Plan Note (Signed)
Patient recently tried fasting for ramadan but had difficulty as she found that her sugars would go up because she was only taking her metformin once a day. She stopped this after about a week and a half. Labs at last visit unremarkable and weight largely stable, slightly decreased today at 155. However patient cites that she often will only eat a tomato for dinner or doesn't eat what her daughter cooks because she is worried it has too much salt in it. Discussed with patient that she is likely eating in a calorie deficit accidentally so this might be the cause of her weight loss. ?- continue to monitor.  ?

## 2022-04-22 NOTE — Assessment & Plan Note (Addendum)
Please see telehealth note from earlier on 04/20/2022, physical examination shows erythema of the throat, but no exudates on the tonsils, she does have a swollen lymph node on the right anterior cervical chain.  Patient denies cough.  Centor score 0, this is likely acute pharyngitis.  Discussed p.o. fluid hydration, and NSAID use per patient. ?- Conservative management at this time, no indication for antibiotics ?- Obtaining COVID test ?

## 2022-04-22 NOTE — Assessment & Plan Note (Signed)
Slightly above goal today however patient forgot to take her medication this morning. ?- f/u BP in 1 month ?

## 2022-04-23 NOTE — Progress Notes (Signed)
Internal Medicine Clinic Attending ° °Case discussed with Dr. DeMaio  At the time of the visit.  We reviewed the resident’s history and exam and pertinent patient test results.  I agree with the assessment, diagnosis, and plan of care documented in the resident’s note. ° ° °

## 2022-04-23 NOTE — Progress Notes (Signed)
Internal Medicine Clinic Attending ? ?Case discussed with Dr. Winters  At the time of the visit.  We reviewed the resident?s history and exam and pertinent patient test results.  I agree with the assessment, diagnosis, and plan of care documented in the resident?s note.  ?

## 2022-07-20 ENCOUNTER — Encounter: Payer: 59 | Admitting: Internal Medicine

## 2022-07-21 ENCOUNTER — Encounter: Payer: Self-pay | Admitting: Student

## 2022-07-21 ENCOUNTER — Ambulatory Visit (INDEPENDENT_AMBULATORY_CARE_PROVIDER_SITE_OTHER): Payer: Self-pay | Admitting: Student

## 2022-07-21 DIAGNOSIS — J029 Acute pharyngitis, unspecified: Secondary | ICD-10-CM

## 2022-07-21 DIAGNOSIS — I10 Essential (primary) hypertension: Secondary | ICD-10-CM

## 2022-07-21 NOTE — Progress Notes (Signed)
   CC: sore throat  HPI:  [name] is a 56 y.o. with PMHx below presenting to clinic with concern for sore throat. Please refer to problem based assessment and plan for details.   Patient presenting with three day history of sore throat, nasal congestion, cough with clear sputum that disrupts daily life. Patient denies fever, changes in voice, or respiratory distress. Denies sick contacts.  Past Medical History:  Diagnosis Date   Acute pharyngitis 03/02/2021   Diabetic foot ulcer associated with type 2 diabetes mellitus ([place]) 01/26/2021   Essential tremor 04/24/2013   Hyperlipidemia    Hypertension    Hypokalemia    Migraine 12/31/2013   T2DM (type 2 diabetes mellitus) ([place]) 09/13/2017   Review of Systems: Refer to problem based assessment and plan for details.   Physical Exam:  Vitals:   07/21/22 0856 07/21/22 0934  BP: (!) 148/112 (!) 166/100  Pulse: 91   Temp: 98.1 F (36.7 C)   TempSrc: Oral   SpO2: 97%   Weight: 158 lb 14.4 oz (72.1 kg)   Height: '5\' 2"'$  (1.575 m)     General: Pleasant, well-appearing woman sitting in chair in NAD EENT: Erythematous mucosa in bilateral nostrils with clear discharge. Bilateral TM: no signs of bulging or empyema. No tenderness or discharge. Mild erythema in posterior oropharynx without exudates or edema. Neck: No anterior cervical tenderness. No LAD. CV: RRR. No murmurs, rubs, or gallops. No LE edema Pulmonary: Lungs CTAB. Normal effort. No wheezing or rales. Extremities: Palpable radial and DP pulses. Normal ROM. Skin: Warm and dry. No obvious rash or lesions. Neuro: A&Ox3. Moves all extremities. Psych: Normal mood and affect  Assessment & Plan:   See Encounters Tab for problem based charting.  Ashley Huynh was seen today for sore throat, cough and chest pain.  Diagnoses and all orders for this visit:  Acute pharyngitis, unspecified etiology  Essential hypertension   Patient received work note today.   Patient seen with  Dr. Evette Doffing

## 2022-07-21 NOTE — Progress Notes (Signed)
Internal Medicine Clinic Attending  I saw and evaluated the patient.  I personally confirmed the key portions of the history and exam documented by Dr. Simeon Craft and I reviewed pertinent patient test results.  The assessment, diagnosis, and plan were formulated together and I agree with the documentation in the resident's note.

## 2022-07-21 NOTE — Patient Instructions (Signed)
Thank you, Ms.Ruthel [name] for allowing [place] to provide your care today. Today we discussed your sore throat. We think you have something called acute pharyngitis, which is an inflammation of your throat. Based on your physical exam, we do not think you need antibiotics at this time. There are a couple of things that could help and you can get at your local pharmacy:  - Allergy medication: Cetirizine - 1 tablet daily - Nasal irrigation with a Netti Pot or with hypertonic sinus rinse wash every day. This should be done daily as part of your routine. - Fluticasone spray - one spray in each nostril daily - Cough drops to soothe your throat   My Chart Access: [email or web address]  Please follow-up in in the next few weeks if the symptoms do not improve or get worse.   Please make sure to arrive 15 minutes prior to your next appointment. If you arrive late, you may be asked to reschedule.    We look forward to seeing you next time. Please call our clinic at [phone] if you have any questions or concerns. The best time to call is Monday-Friday from 9am-4pm, but there is someone available 24/7. If after hours or the weekend, call the main hospital number and ask for the Internal Medicine Resident On-Call. If you need medication refills, please notify your pharmacy one week in advance and they will send [place] a request.   Thank you for letting us take part in your care. Wishing you the best!  Ashley Juniper, MD 07/21/2022, 9:27 AM [facility] Internal [facility] Resident, PGY-1

## 2022-07-21 NOTE — Assessment & Plan Note (Addendum)
Patient presenting with three day history of sore throat, coryza, cough with clear sputum that disrupts daily life. Patient denies fever, changes in voice, or respiratory distress. +chest pain when coughing, but not CP at rest of with exertion. Denies sick contacts or known COVID exposures.  Examination with posterior oropharyngeal erythema without exudate on tonsils. No cervical LAD. Centor score: -0. Likely acute pharyngitis without suspicion for GAS infection. Likely in the setting of viral URI vs recurrent sinusitis. This is patient's 3rd episode this year. Plan: Conservative management with: - Nasal irrigation with  - Fluticasone spray - Cetirizine  - Cough drops

## 2022-07-21 NOTE — Assessment & Plan Note (Addendum)
Vitals:   07/21/22 0856 07/21/22 0934  BP: (!) 148/112 (!) 166/100    Patient with elevated blood pressure this am. Patient mentioned she forgot to take medications today as she was rushing. Gave patient a blood pressure log for home measurements as her previous clinic blood pressure was elevated. Patient mentioned she has medication at home - Continue Amlodipine 10 mg - Lisinopril 20 mg - Spironolactone 25 mg - Return in 1 month for blood pressure follow up

## 2022-08-24 ENCOUNTER — Other Ambulatory Visit: Payer: Self-pay

## 2022-08-24 ENCOUNTER — Ambulatory Visit (INDEPENDENT_AMBULATORY_CARE_PROVIDER_SITE_OTHER): Payer: Self-pay

## 2022-08-24 DIAGNOSIS — Z7984 Long term (current) use of oral hypoglycemic drugs: Secondary | ICD-10-CM

## 2022-08-24 DIAGNOSIS — I10 Essential (primary) hypertension: Secondary | ICD-10-CM

## 2022-08-24 DIAGNOSIS — R059 Cough, unspecified: Secondary | ICD-10-CM | POA: Insufficient documentation

## 2022-08-24 DIAGNOSIS — E119 Type 2 diabetes mellitus without complications: Secondary | ICD-10-CM

## 2022-08-24 DIAGNOSIS — R052 Subacute cough: Secondary | ICD-10-CM

## 2022-08-24 MED ORDER — SPIRONOLACTONE 25 MG PO TABS
25.0000 mg | ORAL_TABLET | Freq: Every day | ORAL | 1 refills | Status: DC
Start: 2022-08-24 — End: 2023-05-25

## 2022-08-24 MED ORDER — POTASSIUM CHLORIDE ER 10 MEQ PO TBCR: 10.0000 meq | EXTENDED_RELEASE_TABLET | Freq: Every day | ORAL | 2 refills | Status: DC

## 2022-08-24 MED ORDER — AMLODIPINE BESYLATE 10 MG PO TABS: 10.0000 mg | ORAL_TABLET | Freq: Every day | ORAL | 3 refills | Status: DC

## 2022-08-24 MED ORDER — METFORMIN HCL ER 500 MG PO TB24: 1000.0000 mg | ORAL_TABLET | Freq: Two times a day (BID) | ORAL | 1 refills | Status: DC

## 2022-08-24 MED ORDER — ATORVASTATIN CALCIUM 40 MG PO TABS: 40.0000 mg | ORAL_TABLET | Freq: Every day | ORAL | 1 refills | Status: DC

## 2022-08-24 MED ORDER — LOSARTAN POTASSIUM 50 MG PO TABS: 50.0000 mg | ORAL_TABLET | Freq: Every day | ORAL | 11 refills | Status: DC

## 2022-08-24 NOTE — Assessment & Plan Note (Signed)
Patient with nonproductive, dry cough for past 2 months following an episode of acute viral pharyngitis/URI, and was COVID-negative at that time.  Cough is worse at night, worse with cold air, and associated with congestion.  She says the Flonase helped initially, but now is providing less relief.  She is also tried Zyrtec and Benadryl, cough drops, nasal irrigation which she say minimally improved her symptoms.  She does have a history of allergies and cough which typically starts during the winter and resolves with cough drops.  She does not have other typical GERD symptoms.  She is on lisinopril 20 daily.  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 - Change lisinopril 20 mg daily to losartan 50 mg daily - Continue supportive measures - Follow-up 4 weeks.  If cough has not improved by then, consider chest x-ray, PFTs for evaluation of cough variant asthma

## 2022-08-24 NOTE — Patient Instructions (Signed)
Ashley Huynh, it was a pleasure seeing you today! You endorsed feeling well today. Below are some of the things we talked about this visit. We look forward to seeing you in the follow up appointment!  Today we discussed: Cough: I'm hoping that changing lisinopril to losartan will help your cough. You can also use Robitussin and keep using your flonase, nasal irrigation, and allergy medication. If your cough is stil there or getting worse at your next appointment, we can discuss next steps  Blood pressure: your blood pressure was still a little high in clinic today. I'm changing your lisinopril to losartan '50mg'$  daily for cough and refilling your other meds. Please try to take your blood pressure at home and bring a log to your next appointment.  Diabetes: we'll check your A1c next visit  I have ordered the following labs today:  Lab Orders  No laboratory test(s) ordered today      Referrals ordered today:   Referral Orders  No referral(s) requested today     I have ordered the following medication/changed the following medications:   Stop the following medications: Medications Discontinued During This Encounter  Medication Reason   lisinopril (ZESTRIL) 20 MG tablet    atorvastatin (LIPITOR) 40 MG tablet Reorder   amLODipine (NORVASC) 10 MG tablet Reorder   spironolactone (ALDACTONE) 25 MG tablet Reorder   metFORMIN (GLUCOPHAGE-XR) 500 MG 24 hr tablet Reorder   potassium chloride (KLOR-CON) 10 MEQ tablet Reorder     Start the following medications: Meds ordered this encounter  Medications   spironolactone (ALDACTONE) 25 MG tablet    Sig: Take 1 tablet (25 mg total) by mouth daily.    Dispense:  90 tablet    Refill:  1   metFORMIN (GLUCOPHAGE-XR) 500 MG 24 hr tablet    Sig: Take 2 tablets (1,000 mg total) by mouth 2 (two) times daily with a meal.    Dispense:  360 tablet    Refill:  1   amLODipine (NORVASC) 10 MG tablet    Sig: Take 1 tablet (10 mg total) by mouth  daily.    Dispense:  90 tablet    Refill:  3   atorvastatin (LIPITOR) 40 MG tablet    Sig: Take 1 tablet (40 mg total) by mouth daily.    Dispense:  90 tablet    Refill:  1   losartan (COZAAR) 50 MG tablet    Sig: Take 1 tablet (50 mg total) by mouth daily.    Dispense:  30 tablet    Refill:  11   potassium chloride (KLOR-CON) 10 MEQ tablet    Sig: Take 1 tablet (10 mEq total) by mouth daily.    Dispense:  30 tablet    Refill:  2     Follow-up:  2-3 weeks    Please make sure to arrive 15 minutes prior to your next appointment. If you arrive late, you may be asked to reschedule.   We look forward to seeing you next time. Please call our clinic at [phone] if you have any questions or concerns. The best time to call is Monday-Friday from 9am-4pm, but there is someone available 24/7. If after hours or the weekend, call the main hospital number and ask for the Internal Medicine Resident On-Call. If you need medication refills, please notify your pharmacy one week in advance and they will send [place] a request.  Thank you for letting us take part in your care. Wishing you the best!  Thank you, Linus Galas MD

## 2022-08-24 NOTE — Assessment & Plan Note (Signed)
A1c at follow-up, she should have insurance card by that point.

## 2022-08-24 NOTE — Progress Notes (Signed)
   CC: cough, HTN  HPI:  [name] is a 56 y.o.-year-old female with past medical history as below presenting for blood pressure follow-up and evaluation of cough.  Please see encounters tab for problem-based charting.  Past Medical History:  Diagnosis Date   Acute pharyngitis 03/02/2021   Diabetic foot ulcer associated with type 2 diabetes mellitus ([place]) 01/26/2021   Essential tremor 04/24/2013   Hyperlipidemia    Hypertension    Hypokalemia    Migraine 12/31/2013   T2DM (type 2 diabetes mellitus) ([place]) 09/13/2017   Review of Systems: As in HPI.  Please see encounters tab for problem based charting.   Physical Exam:  Vitals:   08/24/22 0915 08/24/22 1000  BP: (!) 148/91 (!) 151/97  Pulse: 86 79  Temp: 98.2 F (36.8 C)   TempSrc: Oral   SpO2: 98%   Weight: 158 lb 11.2 oz (72 kg)   Height: '5\' 2"'$  (1.575 m)    General:Well-appearing, pleasant, In NAD HEENT: No pharyngeal erythema, edema or exudates Cardiac: RRR, no murmurs rubs or gallops. Respiratory: Normal work of breathing on room air, CTAB, no wheezing or crackles Abdominal: Soft, nontender, nondistended   Assessment & Plan:   Essential hypertension BP still elevated today after recheck. Patient unable to take blood pressures at home.  States that she has been taking all her meds as prescribed and requested refills, though I do not see recent dispenses.  No headache, chest pain, vision changes.  Does have history of HTN and hypokalemia with elevated aldosterone/renin ratio in 2022, and she is currently on spironolactone. - Changing lisinopril 20 mg daily to losartan 50 mg daily due to cough - We will follow-up in 4 weeks and if blood pressure remains persistently elevated, consider further medication titration with increased spironolactone or adding another agent.  Cough Patient with nonproductive, dry cough for past 2 months following an episode of acute viral pharyngitis/URI, and was COVID-negative at that  time.  Cough is worse at night, worse with cold air, and associated with congestion.  She says the Flonase helped initially, but now is providing less relief.  She is also tried Zyrtec and Benadryl, cough drops, nasal irrigation which she say minimally improved her symptoms.  She does have a history of allergies and cough which typically starts during the winter and resolves with cough drops.  She does not have other typical GERD symptoms.  She is on lisinopril 20 daily.  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 - Change lisinopril 20 mg daily to losartan 50 mg daily - Continue supportive measures - Follow-up 4 weeks.  If cough has not improved by then, consider chest x-ray, PFTs for evaluation of cough variant asthma  Type 2 diabetes mellitus (HCC) A1c at follow-up, she should have insurance card by that point.   Patient seen with Dr. Evette Doffing

## 2022-08-24 NOTE — Assessment & Plan Note (Addendum)
BP still elevated today after recheck. Patient unable to take blood pressures at home.  States that she has been taking all her meds as prescribed and requested refills, though I do not see recent dispenses.  No headache, chest pain, vision changes.  Does have history of HTN and hypokalemia with elevated aldosterone/renin ratio in 2022, and she is currently on spironolactone. - Changing lisinopril 20 mg daily to losartan 50 mg daily due to cough - We will follow-up in 4 weeks and if blood pressure remains persistently elevated, consider further medication titration with increased spironolactone or adding another agent.

## 2022-08-25 NOTE — Progress Notes (Signed)
Internal Medicine Clinic Attending  I saw and evaluated the patient.  I personally confirmed the key portions of the history and exam documented by Dr. Sridharan and I reviewed pertinent patient test results.  The assessment, diagnosis, and plan were formulated together and I agree with the documentation in the resident's note.  

## 2022-09-08 ENCOUNTER — Encounter: Payer: Self-pay | Admitting: Internal Medicine

## 2022-09-08 ENCOUNTER — Other Ambulatory Visit: Payer: Self-pay

## 2022-09-08 ENCOUNTER — Ambulatory Visit (INDEPENDENT_AMBULATORY_CARE_PROVIDER_SITE_OTHER): Payer: Commercial Managed Care - HMO | Admitting: Internal Medicine

## 2022-09-08 VITALS — BP 145/96 | HR 79 | Temp 98.4°F | Wt 158.2 lb

## 2022-09-08 DIAGNOSIS — J329 Chronic sinusitis, unspecified: Secondary | ICD-10-CM

## 2022-09-08 DIAGNOSIS — E782 Mixed hyperlipidemia: Secondary | ICD-10-CM

## 2022-09-08 DIAGNOSIS — M25562 Pain in left knee: Secondary | ICD-10-CM

## 2022-09-08 DIAGNOSIS — E119 Type 2 diabetes mellitus without complications: Secondary | ICD-10-CM

## 2022-09-08 DIAGNOSIS — B9689 Other specified bacterial agents as the cause of diseases classified elsewhere: Secondary | ICD-10-CM

## 2022-09-08 DIAGNOSIS — I10 Essential (primary) hypertension: Secondary | ICD-10-CM

## 2022-09-08 LAB — GLUCOSE, CAPILLARY: Glucose-Capillary: 115 mg/dL — ABNORMAL HIGH (ref 70–99)

## 2022-09-08 LAB — POCT GLYCOSYLATED HEMOGLOBIN (HGB A1C): Hemoglobin A1C: 6.5 % — AB (ref 4.0–5.6)

## 2022-09-08 MED ORDER — CETIRIZINE HCL 10 MG PO TABS: 10.0000 mg | ORAL_TABLET | Freq: Every day | ORAL | 3 refills | Status: DC

## 2022-09-08 MED ORDER — FLUTICASONE PROPIONATE 50 MCG/ACT NA SUSP: 1.0000 | Freq: Two times a day (BID) | NASAL | 2 refills | Status: DC

## 2022-09-08 MED ORDER — AMOXICILLIN-POT CLAVULANATE 875-125 MG PO TABS: 1.0000 | ORAL_TABLET | Freq: Two times a day (BID) | ORAL | 0 refills | Status: AC

## 2022-09-08 MED ORDER — DICLOFENAC SODIUM 1 % EX GEL: CUTANEOUS | 0 refills | Status: AC

## 2022-09-08 NOTE — Progress Notes (Signed)
CC: follow up   HPI:  [name] is a 56 y.o. with medical history of HTN, HLD, DMII presenting to [facility] for follow up on HTN and cough. Last seen on 08/24/22.  Please see problem-based list for further details, assessments, and plans.  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  Current Outpatient Medications (Cardiovascular):    amLODipine (NORVASC) 10 MG tablet, Take 1 tablet (10 mg total) by mouth daily.   atorvastatin (LIPITOR) 40 MG tablet, Take 1 tablet (40 mg total) by mouth daily.   losartan (COZAAR) 50 MG tablet, Take 1 tablet (50 mg total) by mouth daily.   spironolactone (ALDACTONE) 25 MG tablet, Take 1 tablet (25 mg total) by mouth daily.  Current Outpatient Medications (Respiratory):    cetirizine (ZYRTEC ALLERGY) 10 MG tablet, Take 1 tablet (10 mg total) by mouth daily.   fluticasone (FLONASE) 50 MCG/ACT nasal spray, Place 1 spray into both nostrils in the morning and at bedtime.    Current Outpatient Medications (Other):    amoxicillin-clavulanate (AUGMENTIN) 875-125 MG tablet, Take 1 tablet by mouth 2 (two) times daily for 5 days.   Accu-Chek Softclix Lancets lancets, Use as instructed   blood glucose meter kit and supplies, Dispense based on patient and insurance preference. Use up to four times daily as directed. (FOR ICD-10 E10.9, E11.9).   Blood Glucose Monitoring Suppl (FREESTYLE LITE) w/Device KIT, 1 each by Does not apply route daily.   cyclobenzaprine (FLEXERIL) 5 MG tablet, Take by  mouth.   diclofenac Sodium (VOLTAREN) 1 % GEL, APPLY 4 GRAMS TOPICALLY 4 TIMES DAILY   glucose blood (FREESTYLE LITE) test strip, Use as instructed   Lancets (FREESTYLE) lancets, Use as instructed   magnesium oxide (MAG-OX) 400 MG tablet, Take by mouth.   Polyethyl Glycol-Propyl Glycol (LUBRICANT EYE DROPS) 0.4-0.3 % SOLN, Apply 1 drop to eye 2 (two) times daily as needed.   potassium chloride (KLOR-CON) 10 MEQ tablet, Take 1 tablet (10 mEq total) by mouth daily.  Review of Systems:  Review of system negative unless stated in the problem list or HPI.    Physical Exam:  Vitals:   09/08/22 0933  BP: (!) 145/96  Pulse: 79  Temp: 98.4 F (36.9 C)  TempSrc: Oral  Weight: 158 lb 3.2 oz (71.8 kg)    Physical Exam General: NAD HENT: erythema of pharynx, TTP of maxillary sinuses  Lungs: CTAB, no wheeze, rhonchi or rales.  Cardiovascular: Normal heart sounds, no r/m/g, 2+ pulses in all extremities. No LE edema Abdomen: No TTP, normal bowel sounds MSK: No asymmetry or muscle atrophy.  Skin: no lesions noted on exposed skin Neuro: Alert and oriented x4. CN grossly intact Psych: Normal mood and normal affect   Assessment & Plan:   Essential hypertension On losartan 50 mg qd. Spironolactone 25 mg qd. Norvasc 10 mg qd. Normal creatinine in 02/2022. BP elevated in the clinic at 145/96 but pt not at baseline due to sinus pains and sore throat. Advised  to check BP at home and follow up in 3 weeks.   Bacterial sinusitis Pt states her cough is still present. She has been evaluated for URI in previously and states symptoms have not fully recovered. The symptoms have been helped with zyrtec but not fully. She has postnasal drip. She used flonase for one month and saw slight improvement. States her mucus has changed from yellow to green. She is also endorsing chills but no fevers. On exam, she has TTP of sinuses. Given her length of symptoms, bacterial sinusitis appears likely etiology of her  symptoms.  -Continue fluticasone, zyrtec daily -Neti pot BID every day -Augmentin for 5 days.   Type 2 diabetes mellitus (HCC) A1c 6.4 six months ago. This visit A1c is 6.5 making her DMII controlled. Metformin 1000 mg BID. She may benefit from GLP 1 as she is overweight and treatment of that can lead to better glycemic control and better BP control. Consider discussing at next visit.   Hyperlipidemia Previous lipid panel from 09/2021 shows Chol 168, LDL 106. ASCVD 23.5. Primary prevention so goal is LDL less than 100. Lipid panel this visit shows improved cholesterol at 130 and LDL at 70. ASCVD risk improved to 12 but still high given her elevated BP reading. Plan to continue lipitor 40 mg qd.    See Encounters Tab for problem based charting.  Patient discussed with Dr. Oren Binet, MD Ashley Huynh. [facility] Internal Medicine Residency, PGY-2

## 2022-09-08 NOTE — Patient Instructions (Addendum)
Ashley Huynh, it was a pleasure seeing you today! You endorsed feeling well today. Below are some of the things we talked about this visit. We look forward to seeing you in the follow up appointment!  Today we discussed: We will give you antibiotic for your sinus infection. Continue using flonase, zyrtec and neti pot. Use neti pot twice every day.  Your blood pressure is elevated. Please monitor this at home and bring your monitor with you.  We will check some labs.   I have ordered the following labs today:  Lab Orders         Glucose, capillary         Lipid Profile         Basic metabolic panel         POC Hbg A1C       Referrals ordered today:   Referral Orders  No referral(s) requested today     I have ordered the following medication/changed the following medications:   Stop the following medications: There are no discontinued medications.   Start the following medications: Meds ordered this encounter  Medications   amoxicillin-clavulanate (AUGMENTIN) 875-125 MG tablet    Sig: Take 1 tablet by mouth 2 (two) times daily for 5 days.    Dispense:  10 tablet    Refill:  0   fluticasone (FLONASE) 50 MCG/ACT nasal spray    Sig: Place 1 spray into both nostrils in the morning and at bedtime.    Dispense:  16 g    Refill:  2   cetirizine (ZYRTEC ALLERGY) 10 MG tablet    Sig: Take 1 tablet (10 mg total) by mouth daily.    Dispense:  90 tablet    Refill:  3     Follow-up: 3 week follow up  Please make sure to arrive 15 minutes prior to your next appointment. If you arrive late, you may be asked to reschedule.   We look forward to seeing you next time. Please call our clinic at [phone] if you have any questions or concerns. The best time to call is Monday-Friday from 9am-4pm, but there is someone available 24/7. If after hours or the weekend, call the main hospital number and ask for the Internal Medicine Resident On-Call. If you need medication refills, please  notify your pharmacy one week in advance and they will send [place] a request.  Thank you for letting us take part in your care. Wishing you the best!  Thank you, Ashley Schuller, MD   ?????? ???? ??????? ??? ?? ????? ????? ????? ?????! ??? ???? ?????? ????? ?????. ????? ??? ??? ?????? ???? ?????? ???? ?? ??? ???????. ???? ????? ??? ????? ?? ???? ????????!  ?????? ?????: ?????? ?????? ?????? ????? ???? ?????? ???????. ????? ?? ??????? ???? Flonase ?Zyrtec ?Neti. ?????? ???? ???? ????? ?? ???. ??? ??? ?????. ???? ?????? ??? ?? ?????? ?????? ?????? ???. ??? ???? ???? ??? ?????????.  ??? ???? ??????? ??????? ?????:  ????? ???????      ????????? ???????      ????? ??????      ???? ??????? ??????? ????????      POC Hbg A1C   ???????? ???? ?? ????? ?????:  ????? ??????? ?? ??? ??? ?? ????? (??????) ?????    ??? ???? ??????? ???????/???? ??????? ???????:  ????? ??????? ???????: ?? ???? ????? ??????.  ???? ?????? ??????? ???????: ??? ??? ???? ?????? ???????  ??????????? ?????????? (????????) [phone] ??? ???? ???: ????? ????? ?????? ?? ???? ???? ????? (?????) ?????? ????  5 ????. ??????: 10 ????? ????? ???????: 0  ?????????? (FLONASE) 50 MCG/ACT ???? ????? ???: ?? ?????? ?????? ?? ????? ????? ?? ?????? ???? ?????. ?????????: 16 ???? ????? ?????: 2  ?????????? (?????? ????????) 10 ??? ???? ???: ????? ????? ?????? (?????? 10 ???) ?? ???? ???? ??????. ??????: 90 ??? ????? ?????: 3    ????????: ?????? ???? 3 ??????  ???? ?????? ?? ?????? ??? 15 ????? ?? ????? ??????. ??? ???? ???????? ??? ????? ??? ????? ???????.  ???? ????? ??? ????? ?? ????? ???????. ???? ??????? ???????? ??? ????? [phone] ??? ??? ???? ?? ????? ?? ?????. ???? ??? ??????? ?? ?? ??????? ??? ?????? ?? ?????? 9 ?????? ??? 4 ?????? ???? ???? ??? ???? ??? ???? ?????? ???? ???? ???????. ??? ??? ??? ??? ????? ????? ?? ???? ????? ???????? ????? ???? ???????? ??????? ????? ???? ???? ??????? ?????? ??? ?????. ??? ???  ????? ??? ????? ??????? ???? ????? ???????? ??? ????? ???? ???? ?????? ??? ?????.  ????? ??? ?????? ??? ????????? ?? ??????. ????? ?? ??????!  ????? ??? ???? ???? ????? ?? ???? alsayidat ghada khujali, kan min dawaei sururi ruyatuk alyawma! laqad 'ayadat alshueur aljayid alyawma. wafima yali baed al'umur alati tahadathuna eanha fi hadhih alziyarati. wanahn natatalae 'iilaa ruyatik fi maweid almutabaeati! naqashna alyawma: sanuetik mdadan hywyan lieilaj eadwaa aljuyub al'anfiati. astamirr fi astikhdam waea' Flonase waZyrtec waNeti. astakhdim wiea' niati maratayn kula yumin. daght dimik murtafieun. yurjaa muraqabat dhalik fi almanzil wa'iihdar [employer]. sawf naqum bifahs baed almukhtabarati. laqad talabt almaeamil altaaliat alyawma: 'awamir almukhtabar aljulukuz, alshieria mustawaa alduhun lawhat altamthil alghidhayiyi al'asasia POC Hbg A1C al'iihalat alati tama talabuha alyawma: 'awamir al'iihala lam yatima talab 'ayi 'iihala ('iihalati) alyawm  laqad talabat al'adwiat altaaliatu/ghirat al'adwiat altaaliatu: 'iiqaf al'adwiat altaaliati: la tujad 'adwiat mutawaqifatun. aibda bitanawul al'adwiat altaaliati: 'amar mudas bihadha [place]' al'adwia  'amuksisilin klafulanat ('uwjmintin) [phone] malgh luhi siji: tanawal qrsan wahdan ean tariq alfam maratayn (mratayni) ywmyan limudat 5 'ayamu. aljureatu: 10 'aqras 'iieadat altaebiati: 0  fulutikasun (FLONASE) 50 MCG/ACT Laureen Ochs lil'anf sayaji: dae rdhadhan wahdan fi fathatay al'anf fi alsabah waeind alnuwmi. aliastighna'a: 16 jiram 'iieadat almal'i: 2  alsiytrizin (zirtik lilhasasiati) 10 malgh luhy siji: tanawal qrsan wahdan ('iijmali 10 mujama) ean tariq alfam ywmyan. aljureatu: 90 qurs 'iieadat almal'i: 3  almutabaeatu: mutabaeatan limudat 3 'asabie yurjaa alta'akud min alwusul qabl 15 daqiqatan min maweidik altaali. 'iidha wasalat mtakhran, faqad yutlb mink 'iieadat aljadwalati. wanahn natatalae 'iilaa ruyatik fi almarat  alqadimati. yurjaa aliatisal bieiadatina ealaa alraqm [phone] 'iidha kan ladayk 'ayu 'asyilat 'aw makhawifa. 'afdal waqt lilaitisal hu min alaithnayn 'iilaa aljumeat min alsaaeat 9 sbahan hataa 4 msa'an, walakin hunak shakhs mutah ealaa madar alsaaeat tawal 'ayaam al'usbuei. 'iidha kan dhalik baed saeat aleamal 'aw eutlat nihayat al'usbuei, faitasal biraqm almustashfaa alrayiysii waitlub tabib altibi albatinii almuqim taht altalabi. 'iidha kunt bihajat 'iilaa eubuaat dawayiyatin, yurjaa 'iiblagh alsaydaliat qabl 'usbue wahid wasawf yursilun lana tlban. nashkuruk ealaa alsamah lana bialmusharakat fi rieayatika. 'atamanaa lak al'afdala! shkran lika, Darlette Dubow [place], duktur fi altibi

## 2022-09-09 ENCOUNTER — Encounter: Payer: Self-pay | Admitting: Internal Medicine

## 2022-09-09 LAB — LIPID PANEL
Chol/HDL Ratio: 3 ratio (ref 0.0–4.4)
Cholesterol, Total: 130 mg/dL (ref 100–199)
HDL: 43 mg/dL (ref >39)
LDL Chol Calc (NIH): 70 mg/dL (ref 0–99)
Triglycerides: 91 mg/dL (ref 0–149)
VLDL Cholesterol Cal: 17 mg/dL (ref 5–40)

## 2022-09-09 LAB — BASIC METABOLIC PANEL
BUN/Creatinine Ratio: 27 — ABNORMAL HIGH (ref 9–23)
BUN: 20 mg/dL (ref 6–24)
CO2: 29 mmol/L (ref 20–29)
Calcium: 10.3 mg/dL — ABNORMAL HIGH (ref 8.7–10.2)
Chloride: 99 mmol/L (ref 96–106)
Creatinine, Ser: 0.73 mg/dL (ref 0.57–1.00)
Glucose: 111 mg/dL — ABNORMAL HIGH (ref 70–99)
Potassium: 3.8 mmol/L (ref 3.5–5.2)
Sodium: 140 mmol/L (ref 134–144)
eGFR: 98 mL/min/{1.73_m2} (ref >59)

## 2022-09-10 NOTE — Assessment & Plan Note (Signed)
On losartan 50 mg qd. Spironolactone 25 mg qd. Norvasc 10 mg qd. Normal creatinine in 02/2022. BP elevated in the clinic at 145/96 but pt not at baseline due to sinus pains and sore throat. Advised to check BP at home and follow up in 3 weeks.

## 2022-09-10 NOTE — Progress Notes (Signed)
Internal Medicine Clinic Attending  Case discussed with Dr. Khan  At the time of the visit.  We reviewed the resident's history and exam and pertinent patient test results.  I agree with the assessment, diagnosis, and plan of care documented in the resident's note.  

## 2022-09-10 NOTE — Assessment & Plan Note (Signed)
Previous lipid panel from 09/2021 shows Chol 168, LDL 106. ASCVD 23.5. Primary prevention so goal is LDL less than 100. Lipid panel this visit shows improved cholesterol at 130 and LDL at 70. ASCVD risk improved to 12 but still high given her elevated BP reading. Plan to continue lipitor 40 mg qd.

## 2022-09-10 NOTE — Assessment & Plan Note (Signed)
Pt states her cough is still present. She has been evaluated for URI in previously and states symptoms have not fully recovered. The symptoms have been helped with zyrtec but not fully. She has postnasal drip. She used flonase for one month and saw slight improvement. States her mucus has changed from yellow to green. She is also endorsing chills but no fevers. On exam, she has TTP of sinuses. Given her length of symptoms, bacterial sinusitis appears likely etiology of her symptoms.  -Continue fluticasone, zyrtec daily -Neti pot BID every day -Augmentin for 5 days.

## 2022-09-10 NOTE — Assessment & Plan Note (Addendum)
A1c 6.4 six months ago. This visit A1c is 6.5 making her DMII controlled. Metformin 1000 mg BID. She may benefit from GLP 1 as she is overweight and treatment of that can lead to better glycemic control and better BP control. Consider discussing at next visit.

## 2022-09-17 ENCOUNTER — Other Ambulatory Visit: Payer: Self-pay

## 2022-09-20 NOTE — Telephone Encounter (Signed)
I refilled the patients potassium chloride.

## 2022-12-31 ENCOUNTER — Ambulatory Visit: Payer: Self-pay | Admitting: Podiatry

## 2023-01-12 ENCOUNTER — Ambulatory Visit: Payer: Self-pay | Admitting: Podiatry

## 2023-01-26 ENCOUNTER — Ambulatory Visit (INDEPENDENT_AMBULATORY_CARE_PROVIDER_SITE_OTHER): Payer: BLUE CROSS/BLUE SHIELD | Admitting: Internal Medicine

## 2023-01-26 ENCOUNTER — Telehealth: Payer: Self-pay

## 2023-01-26 ENCOUNTER — Encounter: Payer: Self-pay | Admitting: Internal Medicine

## 2023-01-26 VITALS — BP 157/97 | HR 81 | Temp 97.8°F | Wt 165.5 lb

## 2023-01-26 DIAGNOSIS — E119 Type 2 diabetes mellitus without complications: Secondary | ICD-10-CM

## 2023-01-26 DIAGNOSIS — Z7984 Long term (current) use of oral hypoglycemic drugs: Secondary | ICD-10-CM

## 2023-01-26 DIAGNOSIS — N3001 Acute cystitis with hematuria: Secondary | ICD-10-CM

## 2023-01-26 DIAGNOSIS — Z0000 Encounter for general adult medical examination without abnormal findings: Secondary | ICD-10-CM

## 2023-01-26 DIAGNOSIS — I10 Essential (primary) hypertension: Secondary | ICD-10-CM | POA: Diagnosis not present

## 2023-01-26 DIAGNOSIS — R300 Dysuria: Secondary | ICD-10-CM | POA: Diagnosis not present

## 2023-01-26 LAB — POCT URINALYSIS DIPSTICK
Bilirubin, UA: NEGATIVE
Glucose, UA: NEGATIVE
Ketones, UA: NEGATIVE
Nitrite, UA: NEGATIVE
Protein, UA: POSITIVE — AB
Spec Grav, UA: >=1.03 — AB (ref 1.010–1.025)
Urobilinogen, UA: 0.2 E.U./dL
pH, UA: 5.5 (ref 5.0–8.0)

## 2023-01-26 LAB — POCT GLYCOSYLATED HEMOGLOBIN (HGB A1C): Hemoglobin A1C: 6.8 % — AB (ref 4.0–5.6)

## 2023-01-26 LAB — GLUCOSE, CAPILLARY: Glucose-Capillary: 206 mg/dL — ABNORMAL HIGH (ref 70–99)

## 2023-01-26 MED ORDER — CIPROFLOXACIN HCL 500 MG PO TABS: 500.0000 mg | ORAL_TABLET | Freq: Two times a day (BID) | ORAL | 0 refills | Status: AC

## 2023-01-26 MED ORDER — SEMAGLUTIDE(0.25 OR 0.5MG/DOS) 2 MG/3ML ~~LOC~~ SOPN: 0.2500 mg | PEN_INJECTOR | SUBCUTANEOUS | 3 refills | Status: DC

## 2023-01-26 MED ORDER — AMLODIPINE-OLMESARTAN 10-40 MG PO TABS: 1.0000 | ORAL_TABLET | Freq: Every day | ORAL | 2 refills | Status: DC

## 2023-01-26 MED ORDER — METFORMIN HCL 1000 MG PO TABS: 1000.0000 mg | ORAL_TABLET | Freq: Two times a day (BID) | ORAL | 2 refills | Status: DC

## 2023-01-26 NOTE — Telephone Encounter (Signed)
Prior Authorization for patient (ozempic) came through on cover my meds has been submitted to patients insurance -[employer] with last office notes and labs awaiting approval or denial

## 2023-01-26 NOTE — Patient Instructions (Signed)
Dear Ashley Huynh,  Thank you for trusting [place] with your care today.  We discussed your diabetes, pain with urination, and blood pressure.   For your diabetes, I am switching the metformin over to 1 pill in the morning and 1 pill at night. Please also start the Ozempic at 0.'25mg'$  for the first 4 weeks. We will refer you to the eye doctor to have your eyes checked.   For the urinary symptoms, I am sending in an antibiotic to take twice daily for 7 days.   For the blood pressure, I have sent in a combination blood pressure pill amlodipine-olmesartan to start taking. Please also continue taking the spironolactone.   We will see you back in 1 month for a diabetes follow up.

## 2023-01-26 NOTE — Progress Notes (Unsigned)
   CC: dm and uti  HPI:Ashley Huynh is a 57 y.o. female who presents for evaluation of dm. Please see individual problem based A/P for details.  55 yof hx HTN, HLD, DM2 presenting for DM and ?UTI   Depression, PHQ-9: Based on the patients  [name] Visit from 09/08/2022 in [facility]  PHQ-9 Total Score 8      score we have .  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   Diabetic foot ulcer associated with type 2 diabetes mellitus ([place]) 01/26/2021   Essential tremor 04/24/2013   Hyperlipidemia    Hypertension    Hypokalemia    Migraine 12/31/2013   T2DM (type 2 diabetes mellitus) ([place]) 09/13/2017   Review of Systems:   See hpi  Physical Exam: Vitals:   01/26/23 1316 01/26/23 1354  BP: (!) 148/92 (!) 157/97  Pulse: 98 81  Temp: 97.8 F (36.6 C)   TempSrc: Oral   SpO2: 98%   Weight: 165 lb 8 oz (75.1 kg)    General: nad, MO HEENT: Conjunctiva nl , antiicteric sclerae, moist mucous membranes, no exudate or erythema Cardiovascular: Normal rate, regular rhythm.  No murmurs, rubs, or gallops Pulmonary : Equal breath sounds, No wheezes, rales, or rhonchi Abdominal: soft, nontender,  bowel sounds present, bilateral flank tenderness Ext: No edema in lower extremities, no tenderness to palpation of lower extremities.   Assessment & Plan:   See Encounters Tab for problem based charting.  Patient discussed with Dr.  Cain Sieve

## 2023-01-28 LAB — BMP8+ANION GAP
Anion Gap: 18 mmol/L (ref 10.0–18.0)
BUN/Creatinine Ratio: 25 — ABNORMAL HIGH (ref 9–23)
BUN: 19 mg/dL (ref 6–24)
CO2: 21 mmol/L (ref 20–29)
Calcium: 9.6 mg/dL (ref 8.7–10.2)
Chloride: 101 mmol/L (ref 96–106)
Creatinine, Ser: 0.75 mg/dL (ref 0.57–1.00)
Glucose: 176 mg/dL — ABNORMAL HIGH (ref 70–99)
Potassium: 3.7 mmol/L (ref 3.5–5.2)
Sodium: 140 mmol/L (ref 134–144)
eGFR: 94 mL/min/1.73

## 2023-01-30 ENCOUNTER — Encounter: Payer: Self-pay | Admitting: Internal Medicine

## 2023-01-30 NOTE — Assessment & Plan Note (Signed)
Patient compliant with spiro, losartan, amlodipine. No issues. Reports pill burden. Did not take this AM.   BP above goal, will need to continue arb, amlodpine, . Consolidate to amlodipine-olmesartan to reduce pill burden.

## 2023-01-30 NOTE — Assessment & Plan Note (Addendum)
Patient has been compliant with meds. She has been taking metformin BID and is interested in decreasing this due to pill burden. She is also asking about ozempic. She just got medicaid and also wants ophtho referral.  DM still well controlled with A1c 6.8.  We will switch to 1070m tablets to ease pill burden. We will start her on ozempic and uptitrate as able. Can decrease metformin as ozempic  is uptitrated. Urine with elevated acr, will need to improve BP control. Ophtho referral placed.

## 2023-01-30 NOTE — Assessment & Plan Note (Signed)
Patient reports she has been urinating more with burning with urination. No suprapuic pain. She has been drinking more and feels dehydrated. This has been ongoing for 1 week. Also endorses some flank pain which is constant, throbbing. No radiation to groin, no hx kidney stones. Does endorse some chills, but this is not new. No fevers. Having some poor appetite and nause past 1-2 weeks.   POC ua did not have any glucose in it so HHS unlikely and not irritation from glucosuria. BMP not consistent with early DKA either. It did have small amount of blood and leuks. Given patients flank pain, sent in Rx for cipro to treat possible pyleonephritis. Interestingly, urica acid crystals noted in urine. Will need to have patient return for repeat UA. 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

## 2023-01-30 NOTE — Assessment & Plan Note (Signed)
Due for pap. Patient will to return to have pap done with female provider.

## 2023-01-31 NOTE — Progress Notes (Signed)
Internal Medicine Clinic Attending ° °Case discussed with Dr. Gawaluck  At the time of the visit.  We reviewed the resident’s history and exam and pertinent patient test results.  I agree with the assessment, diagnosis, and plan of care documented in the resident’s note.  °

## 2023-01-31 NOTE — Telephone Encounter (Signed)
Decision:Approved Effective 01/26/2023-01/27/2024

## 2023-02-04 LAB — UA/M W/RFLX CULTURE, COMP
Bilirubin, UA: NEGATIVE
Glucose, UA: NEGATIVE
Nitrite, UA: NEGATIVE
Specific Gravity, UA: 1.023 (ref 1.005–1.030)
Urobilinogen, Ur: 0.2 mg/dL (ref 0.2–1.0)
pH, UA: 5.5 (ref 5.0–7.5)

## 2023-02-04 LAB — MICROSCOPIC EXAMINATION
Bacteria, UA: NONE SEEN
Casts: NONE SEEN /lpf

## 2023-02-04 LAB — URINE CULTURE, COMPREHENSIVE

## 2023-02-04 LAB — MICROALBUMIN / CREATININE URINE RATIO
Creatinine, Urine: 217.9 mg/dL
Microalb/Creat Ratio: 145 mg/g creat — ABNORMAL HIGH (ref 0–29)
Microalbumin, Urine: 315.7 ug/mL

## 2023-02-08 NOTE — Progress Notes (Deleted)
HTN Patient compliant with spiro, losartan, amlodipine. No issues. Reports pill burden. Did not take this AM.    BP above goal, will need to continue arb, amlodpine, . Consolidate to amlodipine-olmesartan to reduce pill burden.   T2DM DM still well controlled with A1c 6.8.  We will switch to 1043m tablets to ease pill burden. We will start her on ozempic and uptitrate as able. Can decrease metformin as ozempic  is uptitrated. Urine with elevated acr, will need to improve BP control. Ophtho referral placed.   HLD Previous lipid panel from 09/2021 shows Chol 168, LDL 106. ASCVD 23.5. Primary prevention so goal is LDL less than 100. Lipid panel this visit shows improved cholesterol at 130 and LDL at 70. ASCVD risk improved to 12 but still high given her elevated BP reading. Plan to continue lipitor 40 mg qd.    HCM Pap-Due for pap. Patient will to return to have pap done with female provider.  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  LDL 70 08/2022 Microalbumin 145 01/2023 A1c 6.8 01/26/2023 BMP wnl 01/2023

## 2023-02-16 ENCOUNTER — Encounter: Payer: Medicaid Other | Admitting: Student

## 2023-03-05 ENCOUNTER — Other Ambulatory Visit: Payer: Self-pay

## 2023-03-14 DIAGNOSIS — Z1231 Encounter for screening mammogram for malignant neoplasm of breast: Secondary | ICD-10-CM

## 2023-03-23 ENCOUNTER — Other Ambulatory Visit: Payer: Self-pay

## 2023-03-23 ENCOUNTER — Encounter: Payer: Self-pay | Admitting: Internal Medicine

## 2023-03-23 ENCOUNTER — Ambulatory Visit: Payer: BLUE CROSS/BLUE SHIELD | Admitting: Internal Medicine

## 2023-03-23 VITALS — BP 144/99 | HR 82 | Temp 97.7°F | Ht 64.0 in | Wt 154.1 lb

## 2023-03-23 DIAGNOSIS — Z7984 Long term (current) use of oral hypoglycemic drugs: Secondary | ICD-10-CM

## 2023-03-23 DIAGNOSIS — E119 Type 2 diabetes mellitus without complications: Secondary | ICD-10-CM

## 2023-03-23 DIAGNOSIS — N644 Mastodynia: Secondary | ICD-10-CM

## 2023-03-23 MED ORDER — SEMAGLUTIDE(0.25 OR 0.5MG/DOS) 2 MG/3ML ~~LOC~~ SOPN: 0.5000 mg | PEN_INJECTOR | SUBCUTANEOUS | 1 refills | Status: DC

## 2023-03-23 NOTE — Assessment & Plan Note (Addendum)
The patient presents to the [facility] for an acute visit regarding left breast pain. She states that the pain started about 1 week ago, and she is unsure what first brought the pain on.  The pain is intermittent and sharp in nature, and is located right above her left nipple/areola.  She denies any rashes, or strenuous activity recently. She also denies any fevers, chills, chest pain, or pain anywhere else.   The patient had a normal screening mammogram in July 2022.  With her new left breast pain, will get a diagnostic mammogram.  If this is negative, I suspect that her pain could be muscular in nature.  She does not have a rash at this time, but a cutaneous etiology of her pain is certainly on the differential (zoster).  I have advised the patient to keep an eye on her breast and to call us if she develops a rash.  Plan: -Diagnostic mammogram ordered

## 2023-03-23 NOTE — Patient Instructions (Signed)
Thank you, Ms.Sagan LESANDRA BONELLO for allowing [place] to provide your care today. Today we discussed:  Left breast pain:  We are going to get a mammogram to look at your breasts to see if we can figure out what is causing your pain If that is normal, it could be a muscle strain/or skin infection - keep an eye out to see if you develop a rash    I have ordered the following labs for you:  Lab Orders  No laboratory test(s) ordered today     Tests ordered today:  Mammogram  Referrals ordered today:   Referral Orders  No referral(s) requested today     I have ordered the following medication/changed the following medications:   Stop the following medications: Medications Discontinued During This Encounter  Medication Reason   Semaglutide,0.25 or 0.5MG /DOS, 2 MG/3ML SOPN Reorder     Start the following medications: Meds ordered this encounter  Medications   Semaglutide,0.25 or 0.5MG /DOS, 2 MG/3ML SOPN    Sig: Inject 0.5 mg into the skin once a week.    Dispense:  9 mL    Refill:  1     Follow up: 2-3 months for diabetes   Should you have any questions or concerns please call the internal medicine clinic at [phone].     Buddy Duty, D.O. [place]

## 2023-03-23 NOTE — Progress Notes (Signed)
CC: breast pain  HPI:  Ashley Huynh is a 57 y.o. female living with a history stated below and presents today for an acute visit regarding left breast pain.  She is accompanied by an [employer] farm manager, but most of the encounter is conducted in [place].  Please see problem based assessment and plan for additional details.  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 30 tablet 2   atorvastatin (LIPITOR) 40 MG tablet Take 1 tablet (40 mg total) by mouth daily. 90 tablet 1   blood glucose meter kit and supplies Dispense based on patient and insurance preference. Use up to four times daily as directed. (FOR ICD-10 E10.9, E11.9). 1 each 0   Blood Glucose Monitoring Suppl (FREESTYLE LITE) w/Device KIT 1 each by Does not apply route daily. 1 kit 1   cetirizine (ZYRTEC ALLERGY) 10 MG tablet Take 1 tablet (10 mg total) by mouth daily. 90 tablet 3   cyclobenzaprine (FLEXERIL) 5 MG tablet Take by mouth.     diclofenac Sodium (VOLTAREN) 1 % GEL APPLY 4 GRAMS TOPICALLY 4 TIMES DAILY 350 g 0   fluticasone (FLONASE) 50 MCG/ACT nasal spray Place 1 spray into both nostrils in the morning and at bedtime. 16 g 2   glucose blood (FREESTYLE LITE) test strip Use as instructed 100 each 12   Lancets (FREESTYLE) lancets Use as instructed 100 each 12   magnesium oxide (MAG-OX) 400 MG tablet Take by mouth.     metFORMIN (GLUCOPHAGE) 1000 MG tablet Take 1 tablet (1,000 mg total) by mouth 2 (two) times daily  with a meal. 60 tablet 2   Polyethyl Glycol-Propyl Glycol (LUBRICANT EYE DROPS) 0.4-0.3 % SOLN Apply 1 drop to eye 2 (two) times daily as needed. 4 mL 3   potassium chloride (KLOR-CON) 10 MEQ tablet Take 1 tablet by mouth once daily 90 tablet 0   spironolactone (ALDACTONE) 25 MG tablet Take 1 tablet (25 mg total) by mouth daily. 90 tablet 1   [DISCONTINUED] lisinopril-hydrochlorothiazide (PRINZIDE) 20-12.5 MG per tablet Take 2 tablets by mouth daily. 60 tablet 11   No current facility-administered medications on file prior to visit.    Family History  Problem Relation Age of Onset   Hypertension Mother    Hypertension Father    Colon cancer Neg Hx    Esophageal cancer Neg Hx    Stomach cancer Neg Hx    Rectal cancer Neg Hx     Social History   Socioeconomic History   Marital status: Married    Spouse name: Not on file   Number of children: Not on file   Years of education: Not on file   Highest education level: Not on file  Occupational History   Not on file  Tobacco Use   Smoking status: Never   Smokeless tobacco: Never  Vaping Use   Vaping Use: Never used  Substance and Sexual Activity   Alcohol use: No    Alcohol/week: 0.0  standard drinks of alcohol   Drug use: No   Sexual activity: Not on file  Other Topics Concern   Not on file  Social History Narrative   Not on file   Social Determinants of Health   Financial Resource Strain: Not on file  Food Insecurity: Not on file  Transportation Needs: Not on file  Physical Activity: Not on file  Stress: Not on file  Social Connections: Not on file  Intimate Partner Violence: Not on file    Review of Systems: ROS negative except for what is noted on the assessment and plan.  Vitals:   03/23/23 1442  BP: (!) 144/99  Pulse: 82  Temp: 97.7 F (36.5 C)  TempSrc: Oral  SpO2: 100%  Weight: 154 lb 1.6 oz (69.9 kg)  Height: 5\' 4"  (1.626 m)    Physical Exam: Constitutional: well-appearing middle aged female  sitting in chair, in no acute distress Breast: no palpable masses in bilateral breasts, no axillary lymphadenopathy; no rash/erythema around breasts  Cardiovascular: regular rate and rhythm, no m/r/g Pulmonary/Chest: normal work of breathing on room air Neurological: alert & oriented x 3, no focal deficit Skin: warm and dry Psych: normal mood and behavior  Assessment & Plan:   Patient discussed with Dr. Evette Doffing  Breast pain, left The patient presents to the [facility] for an acute visit regarding left breast pain. She states that the pain started about 1 week ago, and she is unsure what first brought the pain on.  The pain is intermittent and sharp in nature, and is located right above her left nipple/areola.  She denies any rashes, or strenuous activity recently. She also denies any fevers, chills, chest pain, or pain anywhere else.   The patient had a normal screening mammogram in July 2022.  With her new left breast pain, will get a diagnostic mammogram.  If this is negative, I suspect that her pain could be muscular in nature.  She does not have a rash at this time, but a cutaneous etiology of her pain is certainly on the differential (zoster).  I have advised the patient to keep an eye on her breast and to call us if she develops a rash.  Plan: -Diagnostic mammogram ordered   [name], D.O. [facility], PGY-2 Phone: [phone] Date 03/23/2023 Time 3:10 PM

## 2023-03-24 NOTE — Progress Notes (Signed)
Internal Medicine Clinic Attending ° °Case discussed with Dr. Atway  At the time of the visit.  We reviewed the resident’s history and exam and pertinent patient test results.  I agree with the assessment, diagnosis, and plan of care documented in the resident’s note.  °

## 2023-04-11 ENCOUNTER — Ambulatory Visit (INDEPENDENT_AMBULATORY_CARE_PROVIDER_SITE_OTHER): Payer: BLUE CROSS/BLUE SHIELD

## 2023-04-11 VITALS — BP 131/84 | HR 72 | Temp 98.4°F | Wt 155.6 lb

## 2023-04-11 DIAGNOSIS — L989 Disorder of the skin and subcutaneous tissue, unspecified: Secondary | ICD-10-CM | POA: Diagnosis not present

## 2023-04-11 DIAGNOSIS — L700 Acne vulgaris: Secondary | ICD-10-CM | POA: Diagnosis not present

## 2023-04-11 NOTE — Patient Instructions (Signed)
Ms.Terissa HORTENSE CANTRALL, it was a pleasure seeing you today! You endorsed feeling well today. Below are some of the things we talked about this visit. We look forward to seeing you in the follow up appointment!  Today we discussed: Hand: use vaseline and cover with bandaid at work. Otherwise, apply vaseline and leave open to air. If it's growing or getting worse, we can consider biopsy  Acne: use warm compress/wash face twice a day with benzoyl peroxide soap    I have ordered the following labs today:  Lab Orders  No laboratory test(s) ordered today      Referrals ordered today:   Referral Orders  No referral(s) requested today     I have ordered the following medication/changed the following medications:   Stop the following medications: There are no discontinued medications.   Start the following medications: No orders of the defined types were placed in this encounter.    Follow-up: 4 weeks diabetes, hand   Please make sure to arrive 15 minutes prior to your next appointment. If you arrive late, you may be asked to reschedule.   We look forward to seeing you next time. Please call our clinic at [phone] if you have any questions or concerns. The best time to call is Monday-Friday from 9am-4pm, but there is someone available 24/7. If after hours or the weekend, call the main hospital number and ask for the Internal Medicine Resident On-Call. If you need medication refills, please notify your pharmacy one week in advance and they will send [place] a request.  Thank you for letting us take part in your care. Wishing you the best!  Thank you, Lyndle Herrlich MD

## 2023-04-13 DIAGNOSIS — L709 Acne, unspecified: Secondary | ICD-10-CM | POA: Insufficient documentation

## 2023-04-13 NOTE — Assessment & Plan Note (Signed)
Previously noted facial acne.  I see 1 active lesion.  Would recommend conservative management for now, but if worsens, can consider medication therapy

## 2023-04-13 NOTE — Assessment & Plan Note (Signed)
Presents for small lesion on the right pinky.  No bites, scratches, or trauma to the area.  She has been scratching a lot becauseit is itchy.  Lesion appears black, slightly elevated, symmetrical borders, less than 1 cm in diameter without surrounding erythema or any drainage.  See media tab for pictures.  No other similar lesions over hands or anywhere else.  She has not had any new sexual partners. -Will monitor for now.  Recommended conservative management and to reduce scratching.  Can biopsy down the road if it grows or develops concerning features

## 2023-04-13 NOTE — Progress Notes (Signed)
   CC: skin lesions  HPI:  Ashley Huynh is a 57 y.o.-year-old female with past medical history as below presenting for skin lesion.  Please see encounters tab for problem-based charting.  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  Please see encounters tab for problem based charting.  Physical Exam:  Vitals:   04/11/23 1614 04/11/23 1618  BP: (!) 143/87 131/84  Pulse: 78 72  Temp: 98.4 F (36.9 C)   TempSrc: Oral   SpO2: 99%   Weight: 155 lb 9.6 oz (70.6 kg)    General:Well-appearing, pleasant, In NAD Cardiac: RRR, no murmurs rubs or gallops. Respiratory: Normal work of breathing on room air, CTAB Abdominal: Soft, nontender, nondistended Skin: Fingertips covered in ash          Assessment & Plan:   Skin lesion Presents for small lesion on the right pinky.  No bites, scratches, or trauma to the area.  She has been scratching a lot becauseit is itchy.  Lesion appears black, slightly elevated, symmetrical borders, less than 1 cm in diameter without surrounding erythema or any drainage.  See media tab for pictures.  No other similar lesions over hands or anywhere else.  She has not had any new sexual partners. -Will monitor for now.  Recommended conservative management and to reduce scratching.  Can biopsy down the road if it grows or develops concerning features  Acne Previously noted facial acne.  I see 1 active lesion.  Would recommend conservative management for now, but if worsens, can consider medication therapy   Patient discussed with Dr.  Lafonda Mosses

## 2023-04-14 NOTE — Progress Notes (Signed)
Internal Medicine Clinic Attending  Case discussed with Dr. Marjie Skiff  At the time of the visit.  We reviewed the resident's history and exam and pertinent patient test results.  I agree with the assessment, diagnosis, and plan of care documented in the resident's note.   Internal Medicine Clinic Attending  Case discussed with Dr. Marjie Skiff  At the time of the visit.  We reviewed the resident's history and exam and pertinent patient test results.  I agree with the assessment, diagnosis, and plan of care documented in the resident's note.     I agree that lesion on face looks like mild acne. Not 100% sure what's on her pinky - possibly molluscum

## 2023-05-12 ENCOUNTER — Other Ambulatory Visit: Payer: Self-pay | Admitting: Internal Medicine

## 2023-05-12 DIAGNOSIS — I10 Essential (primary) hypertension: Secondary | ICD-10-CM

## 2023-05-24 ENCOUNTER — Encounter: Payer: Self-pay | Admitting: *Deleted

## 2023-05-24 ENCOUNTER — Other Ambulatory Visit: Payer: Self-pay | Admitting: Internal Medicine

## 2023-05-24 ENCOUNTER — Other Ambulatory Visit: Payer: Self-pay

## 2023-05-24 DIAGNOSIS — E119 Type 2 diabetes mellitus without complications: Secondary | ICD-10-CM

## 2023-06-08 ENCOUNTER — Other Ambulatory Visit: Payer: Self-pay

## 2023-06-15 NOTE — Telephone Encounter (Signed)
Please advise 

## 2023-06-19 ENCOUNTER — Other Ambulatory Visit: Payer: Self-pay

## 2023-06-19 DIAGNOSIS — I10 Essential (primary) hypertension: Secondary | ICD-10-CM

## 2023-06-20 NOTE — Telephone Encounter (Signed)
Next appt scheduled 7/8 with Dr Dolores Frame.

## 2023-06-21 ENCOUNTER — Telehealth: Payer: Self-pay

## 2023-06-21 NOTE — Telephone Encounter (Signed)
Prior Authorization for patient (Ozempic) came through on cover my meds was submitted with last office notes and labs awaiting approval or denial 

## 2023-06-21 NOTE — Telephone Encounter (Signed)
Decision: General.Closed by health plan.We thank you for taking the time to submit your request. However, this member has alternative pharmacy benefits. [employer] is the payer of last resort. Please have the pharmacy bill the member's other insurance plan first. If the member feels that this is in error, please have the member call Member Services at [phone]. If the requested medication is not covered or was denied by the [employer], an explanation of benefits or proof of denial must be submitted with the request prior to coverage by [employer] [facility].

## 2023-06-22 ENCOUNTER — Other Ambulatory Visit (HOSPITAL_COMMUNITY): Payer: Self-pay

## 2023-06-22 NOTE — Telephone Encounter (Signed)
A Prior Authorization was initiated for this patients OZEMPIC through CoverMyMeds.   (To [employer])  Key: [id number]

## 2023-06-27 ENCOUNTER — Encounter: Payer: Self-pay | Admitting: Student

## 2023-06-27 ENCOUNTER — Ambulatory Visit (INDEPENDENT_AMBULATORY_CARE_PROVIDER_SITE_OTHER): Payer: BLUE CROSS/BLUE SHIELD | Admitting: Student

## 2023-06-27 VITALS — BP 143/94 | HR 75 | Temp 98.0°F | Wt 157.1 lb

## 2023-06-27 DIAGNOSIS — J329 Chronic sinusitis, unspecified: Secondary | ICD-10-CM

## 2023-06-27 DIAGNOSIS — I10 Essential (primary) hypertension: Secondary | ICD-10-CM

## 2023-06-27 DIAGNOSIS — J029 Acute pharyngitis, unspecified: Secondary | ICD-10-CM | POA: Insufficient documentation

## 2023-06-27 DIAGNOSIS — E785 Hyperlipidemia, unspecified: Secondary | ICD-10-CM

## 2023-06-27 DIAGNOSIS — E782 Mixed hyperlipidemia: Secondary | ICD-10-CM

## 2023-06-27 MED ORDER — AMLODIPINE-OLMESARTAN 10-40 MG PO TABS: 1.0000 | ORAL_TABLET | Freq: Every day | ORAL | 0 refills | Status: DC

## 2023-06-27 MED ORDER — FEXOFENADINE-PSEUDOEPHED ER 60-120 MG PO TB12
1.0000 | ORAL_TABLET | Freq: Two times a day (BID) | ORAL | 2 refills | Status: DC
Start: 2023-06-27 — End: 2023-06-27

## 2023-06-27 MED ORDER — FLUTICASONE PROPIONATE 50 MCG/ACT NA SUSP
1.0000 | Freq: Two times a day (BID) | NASAL | 2 refills | Status: DC
Start: 2023-06-27 — End: 2023-07-11

## 2023-06-27 MED ORDER — FEXOFENADINE HCL 180 MG PO TABS: 180.0000 mg | ORAL_TABLET | Freq: Every day | ORAL | 3 refills | Status: DC

## 2023-06-27 MED ORDER — ATORVASTATIN CALCIUM 40 MG PO TABS: 40.0000 mg | ORAL_TABLET | Freq: Every day | ORAL | 1 refills | Status: DC

## 2023-06-27 NOTE — Patient Instructions (Addendum)
Thank you, Ms.Janece AMYMARIE GAL for allowing [place] to provide your care today. Today we discussed your sore throat and intermittent headache.Today , we will work on a better allergy control . - We will prescribe Allegra for you- Take one pill a day - We will refill your medications  -  Continue to take tylenol and use your nasal spray as needed - We will give the Doctor's note you requested for your job - If you still have symptoms still persist after 2 weeks, please come back to see [place]     I have ordered the following labs for you:  Lab Orders  No laboratory test(s) ordered today      Referrals ordered today:   Referral Orders  No referral(s) requested today     I have ordered the following medication/changed the following medications:   Stop the following medications: There are no discontinued medications.   Start the following medications: No orders of the defined types were placed in this encounter.    Follow up: 2-3 months    Should you have any questions or concerns please call the internal medicine clinic at [phone].    Kathleen Lime, M.D [facility]

## 2023-06-27 NOTE — Telephone Encounter (Signed)
DECISION :    APPROVED 06/24/2023-06/22/2024        ( PT IS IN OFFICE TODAY WILL GIVE A COPY AND PLACE TO SCAN TO CHART ALSO )

## 2023-06-27 NOTE — Assessment & Plan Note (Signed)
History of hypertension currently on Azor 10-40 mg.  Blood pressure in the office today is 140/93.   Plan  - Continue on current regimen of Azor 10-40mg 

## 2023-06-27 NOTE — Assessment & Plan Note (Signed)
History of hyperlipidemia currently on Lipitor 40 mg.  Last BMP and lipid panel  done in February 2024 was unremarkable.  No changes in regimen is recommended at this time. Plan - Continue Lipitor 40 mg

## 2023-06-27 NOTE — Progress Notes (Addendum)
CC: Sore throat  HPI:  [name] is a 57 y.o. female living with a history stated below and presents today for sore throat for the past 2 weeks. Please see problem based assessment and plan for additional details.  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 Use up to four times daily as directed. (FOR ICD-10 E10.9, E11.9). 1 each 0   Blood Glucose Monitoring Suppl (FREESTYLE LITE) w/Device KIT 1 each by Does not apply route daily. 1 kit 1   cyclobenzaprine (FLEXERIL) 5 MG tablet Take by mouth.     diclofenac Sodium (VOLTAREN) 1 % GEL APPLY 4 GRAMS TOPICALLY 4 TIMES DAILY 350 g 0   glucose blood (FREESTYLE LITE) test strip Use as instructed 100 each 12   Lancets (FREESTYLE) lancets Use as instructed 100 each 12   magnesium oxide (MAG-OX) 400 MG tablet Take by mouth.     metFORMIN (GLUCOPHAGE) 1000 MG tablet TAKE 1 TABLET BY MOUTH TWICE DAILY WITH MEALS 60 tablet 0   Polyethyl Glycol-Propyl Glycol (LUBRICANT EYE DROPS) 0.4-0.3 % SOLN Apply 1 drop to eye 2 (two) times daily as needed. 4 mL 3   potassium chloride (KLOR-CON) 10 MEQ tablet Take 1 tablet by mouth once daily 90 tablet 0   Semaglutide,0.25 or 0.5MG /DOS, 2 MG/3ML SOPN Inject 0.5 mg into the skin once a week. 9 mL 1   spironolactone (ALDACTONE) 25 MG tablet Take 1 tablet by mouth once daily 90 tablet 0   [DISCONTINUED] lisinopril-hydrochlorothiazide (PRINZIDE) 20-12.5 MG per tablet Take 2  tablets by mouth daily. 60 tablet 11   No current facility-administered medications on file prior to visit.    Family History  Problem Relation Age of Onset   Hypertension Mother    Hypertension Father    Colon cancer Neg Hx    Esophageal cancer Neg Hx    Stomach cancer Neg Hx    Rectal cancer Neg Hx     Social History   Socioeconomic History   Marital status: Married    Spouse name: Not on file   Number of children: Not on file   Years of education: Not on file   Highest education level: Not on file  Occupational History   Not on file  Tobacco Use   Smoking status: Never   Smokeless tobacco: Never  Vaping Use   Vaping status: Never Used  Substance and Sexual Activity   Alcohol use: No    Alcohol/week: 0.0 standard drinks of alcohol   Drug use: No   Sexual activity: Not on file  Other Topics Concern   Not on file  Social History Narrative   Not on file   Social Determinants of Health   Financial Resource Strain: Not on file  Food Insecurity: Not on file  Transportation Needs: Not on file  Physical Activity: Not on file  Stress: Not on file  Social Connections: Not on file  Intimate Partner Violence: Not  on file    Review of Systems: ROS negative for cough, subjective fevers   Vitals:   06/27/23 0853 06/27/23 0912  BP: (!) 140/93 (!) 143/94  Pulse: 79 75  Temp: 98 F (36.7 C)   TempSrc: Oral   SpO2: 99%   Weight: 157 lb 1.6 oz (71.3 kg)     Physical Exam: Constitutional: NAD HENT: Mild erythema in  throat, no lymphadenopathy ,no tenderness in throat Cardiovascular: regular rate and rhythm, no m/r/g Pulmonary/Chest: normal work of breathing on room air, lungs clear to auscultation bilaterally  Assessment & Plan:   Sore throat Patient presents due to concerns for sore throat that has been going on for the past 2 weeks, tried Tylenol and salt water gargle but has not been effective at this time.Has a history of allergies for which she uses Flonase  as needed. Denies cough and fever. There is no exudate in throat,no lymphadenopathy, however mild erythema noted in throat on exam. 1 point on centor criteria score ,less suspicion of bacterial pharyngitis vs viral infection.Patient has a history of allergies which could be a potential cause of her soar throat ,not dismissing post nasal drip entirely.  Will consider treating her allergies at that time and follow-up with the patient in few weeks. Plan - Continue Flonase ( counseled on proper use of spray ) - Start Allegra one tab a day - Continue tylenol as needed -  Follow up in 2 weeks if symptoms persist  Essential hypertension History of hypertension currently on Azor 10-40 mg.  Blood pressure in the office today is 140/93.   Plan  - Continue on current regimen of Azor 10-40mg   Hyperlipidemia History of hyperlipidemia currently on Lipitor 40 mg.  Last BMP and lipid panel  done in February 2024 was unremarkable.  No changes in regimen is recommended at this time. Plan - Continue Lipitor 40 mg    Patient seen with Dr. Rip Harbour, M.D [facility] Phone: [phone] Date 07/01/2023 Time 11:23 AM

## 2023-06-27 NOTE — Assessment & Plan Note (Addendum)
Patient presents due to concerns for sore throat that has been going on for the past 2 weeks, tried Tylenol and salt water gargle but has not been effective at this time.Has a history of allergies for which she uses Flonase as needed. Denies cough and fever. There is no exudate in throat,no lymphadenopathy, however mild erythema noted in throat on exam. 1 point on centor criteria score ,less suspicion of bacterial pharyngitis vs viral infection.Patient has a history of allergies which could be a potential cause of her soar throat ,not dismissing post nasal drip entirely.  Will consider treating her allergies at that time and follow-up with the patient in few weeks. Plan - Continue Flonase ( counseled on proper use of spray ) - Start Allegra one tab a day - Continue tylenol as needed -  Follow up in 2 weeks if symptoms persist

## 2023-07-01 NOTE — Progress Notes (Signed)
Internal Medicine Clinic Attending  I was physically present during the key portions of the resident provided service and participated in the medical decision making of patient's management care. I reviewed pertinent patient test results.  The assessment, diagnosis, and plan were formulated together and I agree with the documentation in the resident's note.  Rance Smithson, MD  

## 2023-07-11 ENCOUNTER — Ambulatory Visit
Admission: EM | Admit: 2023-07-11 | Discharge: 2023-07-11 | Disposition: A | Discharge status: Home / Self Care | Payer: Medicaid Other | Attending: Physician Assistant | Admitting: Physician Assistant

## 2023-07-11 DIAGNOSIS — J329 Chronic sinusitis, unspecified: Secondary | ICD-10-CM

## 2023-07-11 DIAGNOSIS — H6993 Unspecified Eustachian tube disorder, bilateral: Secondary | ICD-10-CM | POA: Diagnosis not present

## 2023-07-11 DIAGNOSIS — H9203 Otalgia, bilateral: Secondary | ICD-10-CM

## 2023-07-11 DIAGNOSIS — H6121 Impacted cerumen, right ear: Secondary | ICD-10-CM | POA: Diagnosis not present

## 2023-07-11 MED ORDER — FEXOFENADINE HCL 180 MG PO TABS: 180.0000 mg | ORAL_TABLET | Freq: Every day | ORAL | 3 refills | Status: DC

## 2023-07-11 MED ORDER — FLUTICASONE PROPIONATE 50 MCG/ACT NA SUSP
1.0000 | Freq: Two times a day (BID) | NASAL | 0 refills | Status: DC
Start: 2023-07-11 — End: 2023-07-11

## 2023-07-11 MED ORDER — CARBAMIDE PEROXIDE 6.5 % OT SOLN: 5.0000 [drp] | Freq: Every day | OTIC | 0 refills | Status: DC

## 2023-07-11 MED ORDER — FLUTICASONE PROPIONATE 50 MCG/ACT NA SUSP
1.0000 | Freq: Two times a day (BID) | NASAL | 0 refills | Status: DC
Start: 2023-07-11 — End: 2023-10-04

## 2023-07-11 NOTE — Discharge Instructions (Addendum)
There is no evidence of an infection.  You did have wax buildup in the right.  Use Debrox daily.  Do not put anything in the ear including earbuds, earplugs, Q-tips.  Start Allegra at night.  Use fluticasone nasal spray.  Follow-up with ENT if symptoms or not improving.  You can use Tylenol and ibuprofen for pain.  If anything worsens and you have increasing pain, fever, difficulty hearing, drainage from the ear you should be seen immediately.  ?? ???? ???? ??? ???? ????.  ??? ??? ???? ????? ????? ??? ??????.  ?????? ??????? ??????.  ?? ??? ?? ??? ?? ????? ??? ?? ??? ?????? ????? ??????? ????? ?????? ?????.  ???? ????? ?????? ?? ?????.  ?????? ???? ????? ??????????.  ???????? ?? ???? ????? ?????? ???????? ??? ???? ??????? ?? ??? ??????.  ????? ??????? ???????? ??????????? ?????? ?????.  ??? ????? ?? ??? ????? ???? ??????? ????? ?????? ?? ?????? ?????? ?? ?????? ???? ???? ??? ?????. la yujad dalil ealaa wujud eadwaa. laqad kan ladayk tarakum alshame ealaa alyamin. aistakhdam dibruks ywmyan. la tadae 'aya shay' fi al'udhun bima fi dhalik samaaeat al'udhun wasadadat al'udhun wa'aewad alqutnu. aibda tashghil ailijra fi allayli. aistukhdim radhadh al'anf flutikazun. almutabieat mae tabib al'anf wal'udhun walhanjarat 'iidha kanat al'aerad 'aw eadam altahasuni. [place] aistikhdam taylinul wa'iibubrufin litakhfif al'almi. 'iidhan tafaqam 'ayi shay' washaearat bi'alam mutazayidin, wahumaa, wasueubat fi alsamea, wa'iifraz min al'udhun, fayajib fahsuk ealaa alfur.

## 2023-07-11 NOTE — ED Provider Notes (Signed)
[facility]    CSN: [id number] Arrival date & time: 07/11/23  1744      History   Chief Complaint Chief Complaint  Patient presents with   Ear Pain    HPI Ashley Huynh is a 57 y.o. female.   Patient presents today companied by her daughter who provides the majority of history as well as translation.  Reports for the past 3 to 4 days she has had intermittent ear pain.  This started on the left but then is spread to the right.  Pain is rated 9 on a 0-10 pain scale, intermittent without identifiable trigger, described as shooting, no alleviating factors notified.  She has tried Tylenol without improvement of symptoms.  She does have a history of seasonal allergies typically managed with cetirizine and fluticasone nasal spray but has not been using them regularly.  Denies any recent antibiotics or steroids.  She denies any recent airplane travel or swimming.  Denies any additional symptoms including cough, congestion, fever, nausea, vomiting.  She does not use anything in the ear.    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   Diabetic foot ulcer associated with type 2 diabetes mellitus (HCC) 01/26/2021   Essential tremor 04/24/2013   Hyperlipidemia    Hypertension    Hypokalemia    Migraine 12/31/2013   T2DM (type 2 diabetes mellitus) (HCC) 09/13/2017    Patient Active Problem List   Diagnosis Date Noted   Sore throat 06/27/2023   Acne 04/13/2023   Breast pain, left 03/23/2023   Cough 08/24/2022   Dry eye 04/22/2022   Unintentional weight loss 03/10/2022   Trapezius muscle spasm 11/16/2021   Hyperlipidemia 10/22/2021   Screening for malignant neoplasm of cervix 09/30/2021   Left knee pain 09/11/2021   Fatigue 09/11/2021   Bacterial sinusitis 03/02/2021   Type 2 diabetes mellitus (HCC) 09/07/2017   Hypokalemia 02/02/2017   Healthcare maintenance 01/10/2013   Skin lesion 11/14/2012   UTI (urinary tract  infection) 02/29/2012   Essential hypertension 08/29/2007    Past Surgical History:  Procedure Laterality Date   EXTERNAL EAR SURGERY     right    OB History     Gravida  5   Para  4   Term  4   Preterm  0   AB  1   Living  4      SAB  1   IAB      Ectopic      Multiple      Live Births               Home Medications    Prior to Admission medications   Medication Sig Start Date End Date Taking? Authorizing Provider  Accu-Chek Softclix Lancets lancets Use as instructed 09/30/21   Demaio, Alexa, MD  amLODipine-olmesartan (AZOR) 10-40 MG tablet Take 1 tablet by mouth daily. 06/27/23   Kathleen Lime, MD  atorvastatin (LIPITOR) 40 MG tablet Take 1 tablet (40 mg total) by mouth daily. 06/27/23   Kathleen Lime, MD  blood glucose meter kit and supplies Dispense based on patient and insurance preference. Use up to four times daily as directed. (FOR ICD-10 E10.9, E11.9). 09/11/21   Adron Bene, MD  Blood Glucose Monitoring Suppl (FREESTYLE LITE) w/Device KIT 1 each by Does not apply route daily. 09/30/21   Demaio, Alexa, MD  carbamide peroxide (DEBROX) 6.5 % OTIC solution Place 5 drops into the right  ear daily. 07/11/23   Sirena Riddle, Noberto Retort, PA-C  cyclobenzaprine (FLEXERIL) 5 MG tablet Take by mouth. 09/29/21   [provider]  diclofenac Sodium (VOLTAREN) 1 % GEL APPLY 4 GRAMS TOPICALLY 4 TIMES DAILY 09/08/22   Gwenevere Abbot, MD  fexofenadine (ALLEGRA) 180 MG tablet Take 1 tablet (180 mg total) by mouth at bedtime. 07/11/23   Divonte Senger, Noberto Retort, PA-C  fluticasone (FLONASE) 50 MCG/ACT nasal spray Place 1 spray into both nostrils in the morning and at bedtime. 07/11/23 07/10/24  Ifeanyichukwu Wickham, Denny Peon K, PA-C  glucose blood (FREESTYLE LITE) test strip Use as instructed 09/30/21   Ilene Qua, MD  Lancets (FREESTYLE) lancets Use as instructed 09/30/21   Ilene Qua, MD  magnesium oxide (MAG-OX) 400 MG tablet Take by mouth. 09/29/21   [provider]  metFORMIN  (GLUCOPHAGE) 1000 MG tablet TAKE 1 TABLET BY MOUTH TWICE DAILY WITH MEALS 05/25/23   Lyndle Herrlich, MD  Polyethyl Glycol-Propyl Glycol (LUBRICANT EYE DROPS) 0.4-0.3 % SOLN Apply 1 drop to eye 2 (two) times daily as needed. 04/22/22   Demaio, Alexa, MD  potassium chloride (KLOR-CON) 10 MEQ tablet Take 1 tablet by mouth once daily 03/08/23   Lyndle Herrlich, MD  Semaglutide,0.25 or 0.5MG /DOS, 2 MG/3ML SOPN Inject 0.5 mg into the skin once a week. 03/23/23   Atway, Derwood Kaplan, DO  spironolactone (ALDACTONE) 25 MG tablet Take 1 tablet by mouth once daily 05/25/23   Lyndle Herrlich, MD  lisinopril-hydrochlorothiazide (PRINZIDE) 20-12.5 MG per tablet Take 2 tablets by mouth daily. 02/29/12 03/14/12  Lorretta Harp, MD    Family History Family History  Problem Relation Age of Onset   Hypertension Mother    Hypertension Father    Colon cancer Neg Hx    Esophageal cancer Neg Hx    Stomach cancer Neg Hx    Rectal cancer Neg Hx     Social History Social History   Tobacco Use   Smoking status: Never   Smokeless tobacco: Never  Vaping Use   Vaping status: Never Used  Substance Use Topics   Alcohol use: No    Alcohol/week: 0.0 standard drinks of alcohol   Drug use: No     Allergies   Other   Review of Systems Review of Systems  Constitutional:  Positive for activity change. Negative for appetite change, fatigue and fever.  HENT:  Positive for ear pain. Negative for congestion, sinus pressure, sneezing and sore throat.   Respiratory:  Negative for cough and shortness of breath.   Cardiovascular:  Negative for chest pain.  Gastrointestinal:  Negative for abdominal pain, diarrhea, nausea and vomiting.     Physical Exam Triage Vital Signs ED Triage Vitals [07/11/23 1857]  Encounter Vitals Group     BP 124/87     Systolic BP Percentile      Diastolic BP Percentile      Pulse Rate 79     Resp 18     Temp 98.1 F (36.7 C)     Temp Source Oral     SpO2 97 %     Weight       Height      Head Circumference      Peak Flow      Pain Score 3     Pain Loc      Pain Education      Exclude from Growth Chart    No data found.  Updated Vital Signs BP 124/87 (BP Location: Right Arm)   Pulse 79  Temp 98.1 F (36.7 C) (Oral)   Resp 18   LMP 05/23/2019 Comment: abnormal  SpO2 97%   Visual Acuity Right Eye Distance:   Left Eye Distance:   Bilateral Distance:    Right Eye Near:   Left Eye Near:    Bilateral Near:     Physical Exam Vitals reviewed.  Constitutional:      General: She is awake. She is not in acute distress.    Appearance: Normal appearance. She is well-developed. She is not ill-appearing.     Comments: Very pleasant female appears stated age in no acute distress sitting comfortably in exam room  HENT:     Head: Normocephalic and atraumatic.     Right Ear: Tympanic membrane, ear canal and external ear normal. There is impacted cerumen. Tympanic membrane is not erythematous or bulging.     Left Ear: Ear canal and external ear normal. Tympanic membrane is retracted. Tympanic membrane is not erythematous or bulging.     Nose:     Right Sinus: No maxillary sinus tenderness or frontal sinus tenderness.     Left Sinus: No maxillary sinus tenderness or frontal sinus tenderness.     Mouth/Throat:     Pharynx: Uvula midline. No oropharyngeal exudate or posterior oropharyngeal erythema.  Cardiovascular:     Rate and Rhythm: Normal rate and regular rhythm.     Heart sounds: Normal heart sounds, S1 normal and S2 normal. No murmur heard. Pulmonary:     Effort: Pulmonary effort is normal.     Breath sounds: Normal breath sounds. No wheezing, rhonchi or rales.     Comments: Clear to auscultation bilaterally Psychiatric:        Behavior: Behavior is cooperative.      UC Treatments / Results  Labs (all labs ordered are listed, but only abnormal results are displayed) Labs Reviewed - No data to display  EKG   Radiology No results  found.  Procedures Procedures (including critical care time)  Medications Ordered in UC Medications - No data to display  Initial Impression / Assessment and Plan / UC Course  I have reviewed the triage vital signs and the nursing notes.  Pertinent labs & imaging results that were available during my care of the patient were reviewed by me and considered in my medical decision making (see chart for details).     Patient is well-appearing, afebrile, nontoxic, nontachycardic.  No evidence of acute infection on physical exam that would warrant initiation of antibiotics.  She did have cerumen impaction on right which we attempted to irrigate.  We were somewhat successful and I was able to visualize TM that appears normal but did not remove all of the wax as patient had difficulty tolerating this procedure.  She was given Debrox to help manage symptoms and instructed to follow-up with ENT to consider additional wax removal if symptoms persist.  We discussed that her seasonal allergies could be contributing to symptoms and so she was started on Allegra and fluticasone to help manage symptoms.  She can use over-the-counter analgesics such as Tylenol and ibuprofen.  If she has any worsening or changing symptoms including increasing pain, otorrhea, fever, nausea, vomiting she needs to be seen immediately.  Strict return precautions given to which she expressed understanding.  All questions were answered to patient and daughter satisfaction.  Final Clinical Impressions(s) / UC Diagnoses   Final diagnoses:  Dysfunction of both eustachian tubes  Impacted cerumen of right ear  Otalgia of both ears  Discharge Instructions      There is no evidence of an infection.  You did have wax buildup in the right.  Use Debrox daily.  Do not put anything in the ear including earbuds, earplugs, Q-tips.  Start Allegra at night.  Use fluticasone nasal spray.  Follow-up with ENT if symptoms or not improving.  You  can use Tylenol and ibuprofen for pain.  If anything worsens and you have increasing pain, fever, difficulty hearing, drainage from the ear you should be seen immediately.  ?? ???? ???? ??? ???? ????.  ??? ??? ???? ????? ????? ??? ??????.  ?????? ??????? ??????.  ?? ??? ?? ??? ?? ????? ??? ?? ??? ?????? ????? ??????? ????? ?????? ?????.  ???? ????? ?????? ?? ?????.  ?????? ???? ????? ??????????.  ???????? ?? ???? ????? ?????? ???????? ??? ???? ??????? ?? ??? ??????.  ????? ??????? ???????? ??????????? ?????? ?????.  ??? ????? ?? ??? ????? ???? ??????? ????? ?????? ?? ?????? ?????? ?? ?????? ???? ???? ??? ?????. la yujad dalil ealaa wujud eadwaa. laqad kan ladayk tarakum alshame ealaa alyamin. aistakhdam dibruks ywmyan. la tadae 'aya shay' fi al'udhun bima fi dhalik samaaeat al'udhun wasadadat al'udhun wa'aewad alqutnu. aibda tashghil ailijra fi allayli. aistukhdim radhadh al'anf flutikazun. almutabieat mae tabib al'anf wal'udhun walhanjarat 'iidha kanat al'aerad 'aw eadam altahasuni. [place] aistikhdam taylinul wa'iibubrufin litakhfif al'almi. 'iidhan tafaqam 'ayi shay' washaearat bi'alam mutazayidin, wahumaa, wasueubat fi alsamea, wa'iifraz min al'udhun, fayajib fahsuk ealaa alfur.     ED Prescriptions     Medication Sig Dispense Auth. Provider   fexofenadine (ALLEGRA) 180 MG tablet  (Status: Discontinued) Take 1 tablet (180 mg total) by mouth at bedtime. 30 tablet Yvana Samonte K, PA-C   fluticasone (FLONASE) 50 MCG/ACT nasal spray  (Status: Discontinued) Place 1 spray into both nostrils in the morning and at bedtime. 16 g Aaron Bostwick K, PA-C   carbamide peroxide (DEBROX) 6.5 % OTIC solution  (Status: Discontinued) Place 5 drops into the right ear daily. 15 mL Brinnley Lacap K, PA-C   carbamide peroxide (DEBROX) 6.5 % OTIC solution Place 5 drops into the right ear daily. 15 mL Aamna Mallozzi K, PA-C   fexofenadine (ALLEGRA) 180 MG tablet Take 1 tablet (180 mg total) by mouth at bedtime. 30 tablet  Keghan Mcfarren K, PA-C   fluticasone (FLONASE) 50 MCG/ACT nasal spray Place 1 spray into both nostrils in the morning and at bedtime. 16 g Evangelynn Lochridge K, PA-C      PDMP not reviewed this encounter.   Jeani Hawking, PA-C 07/11/23 2029

## 2023-07-11 NOTE — ED Triage Notes (Signed)
Pt presents with c/o bilateral ear pain X 3 days. Pt states last night to was hard to sleep. Denies fever.

## 2023-07-18 ENCOUNTER — Telehealth: Payer: Self-pay | Admitting: *Deleted

## 2023-07-18 NOTE — Telephone Encounter (Signed)
Patient's daughter called in stating patient has had left ear pain x 1 month. States she was evaluated at [facility] and given ear drops for wax and nasal spray. States pain continues. Per UC note on 7/22, both TMs were WNL. First available appt not till 8/12. She is advised to continue nasal spray and call back for cancellation.   Also, requested patient put daughter on as EC.

## 2023-07-18 NOTE — Telephone Encounter (Signed)
Appt became available for 7/31 at 1330. Daughter will take this appt.

## 2023-07-20 ENCOUNTER — Ambulatory Visit: Payer: BLUE CROSS/BLUE SHIELD | Admitting: Student

## 2023-07-20 VITALS — BP 143/87 | HR 94 | Temp 98.1°F | Ht 62.0 in | Wt 162.9 lb

## 2023-07-20 DIAGNOSIS — H9203 Otalgia, bilateral: Secondary | ICD-10-CM | POA: Insufficient documentation

## 2023-07-20 DIAGNOSIS — R12 Heartburn: Secondary | ICD-10-CM | POA: Diagnosis not present

## 2023-07-20 DIAGNOSIS — E785 Hyperlipidemia, unspecified: Secondary | ICD-10-CM

## 2023-07-20 DIAGNOSIS — Z7984 Long term (current) use of oral hypoglycemic drugs: Secondary | ICD-10-CM | POA: Diagnosis not present

## 2023-07-20 DIAGNOSIS — E119 Type 2 diabetes mellitus without complications: Secondary | ICD-10-CM | POA: Diagnosis not present

## 2023-07-20 LAB — POCT GLYCOSYLATED HEMOGLOBIN (HGB A1C): Hemoglobin A1C: 6.3 % — AB (ref 4.0–5.6)

## 2023-07-20 LAB — GLUCOSE, CAPILLARY: Glucose-Capillary: 190 mg/dL — ABNORMAL HIGH (ref 70–99)

## 2023-07-20 MED ORDER — PANTOPRAZOLE SODIUM 40 MG PO TBEC
40.0000 mg | DELAYED_RELEASE_TABLET | Freq: Every day | ORAL | 0 refills | Status: DC
Start: 2023-07-20 — End: 2024-02-22

## 2023-07-20 MED ORDER — AMOXICILLIN-POT CLAVULANATE 875-125 MG PO TABS
1.0000 | ORAL_TABLET | Freq: Two times a day (BID) | ORAL | 0 refills | Status: AC
Start: 2023-07-20 — End: 2023-07-27

## 2023-07-20 MED ORDER — ATORVASTATIN CALCIUM 40 MG PO TABS
40.0000 mg | ORAL_TABLET | Freq: Every day | ORAL | 3 refills | Status: DC
Start: 2023-07-20 — End: 2024-01-30

## 2023-07-20 NOTE — Assessment & Plan Note (Signed)
Patient has a history of T2DM and needs a repeat Hgb A1c: PLAN: -Repeat Hgb A1c today was 6.3% -Continue metformin, atorvastatin, Azor -Follow up in three months to recheck Hbg A1c

## 2023-07-20 NOTE — Assessment & Plan Note (Addendum)
Patient presented today with 8/10 R>L otalgia that began 10 days ago. She was evaluated at an urgent care where they attempted to disimpact the R impacted cerumen but she was unable to tolerate the procedure and they recommended allegra, Flonase, and debrox ear drops. On exam today, the left tympanic membrane was visualized: no acute otis media or perforation observed. The right tympanic membrane was not able to be visualized during exam. The right external ear canal was partially obstructed. Current differential for partially obstructed right external ear canal is cholesteatoma vs partially dissolved ear wax vs otitis externa.  PLAN:  -ENT referral sent -7 day course of Augmentin BID sent for possible otitis externa in a diabetic patient. AVOID ciprofloxacin drops for now due to right tympanic membrane unable to be visualized - Avoid any ear drops, discontinue debrox drops -please return in one month to recheck  -Will hold on CRP and sed rate due to low suspicion for temporal arteritis

## 2023-07-20 NOTE — Progress Notes (Signed)
Spoke to patient on the phone and notified her of her results. Will continue current management for the T2DM.

## 2023-07-20 NOTE — Progress Notes (Signed)
Established Patient Office Visit, Acute   Subjective   Patient ID: Ashley Huynh, female    DOB: 10-27-1966  Age: 57 y.o. MRN: [id number]  Chief Complaint  Patient presents with   Diabetes   Follow-up   Ear Problem    Ashley Huynh is a 57 year old female with a past medical history of T2DM, HTN, and atypical migraines presents today for bilateral R>L ear pain that began 10 days ago. She was recently evaluated at an urgent care on 07/11/23 where they attempted to disimpact the right ear. After partially disimpacting the R ear due to patient not tolerating the procedure, she was sent home with allegra, Flonase, and debrox drops which not help.  Her pain is currently 8/10 right>left ear pain.   She does report a history of seasonal allergies but denies otalgia. She reports increase in mucous from her throat and nose along with bilateral sinus pain, and a sore throat in the morning.  She stated that she has a new onset headache that is different from her usual migraines. The headache is located at the bilateral temporal regions. She denies jaw pain/ claudication symptoms. Denies changes/loss of vision, weight loss, fever, chills, or weakness in the shoulder/hip girdles.  She also reports severe nausea for 5 days. The nausea worsens after she eats and is not associated with certain foods. Patient also reports feeling "acid in my throat". She denied sick contacts.        Objective:     BP (!) 143/87 (BP Location: Right Arm, Patient Position: Sitting, Cuff Size: Small)   Pulse 94   Temp 98.1 F (36.7 C) (Oral)   Ht 5\' 2"  (1.575 m)   Wt 162 lb 14.4 oz (73.9 kg)   LMP 05/23/2019 Comment: abnormal  SpO2 98%   BMI 29.79 kg/m  BP Readings from Last 3 Encounters:  07/20/23 (!) 143/87  07/11/23 124/87  06/27/23 (!) 143/94      Physical Exam HENT:     Right Ear: Tenderness present.     Left Ear: Tympanic membrane normal.     Ears:     Comments: Unable to visualize right  tympanic membrane. Possible cholesteatoma present in ear canal vs external otitis vs partially dissolved ear wax.  Cardiovascular:     Rate and Rhythm: Normal rate and regular rhythm.  Pulmonary:     Effort: Pulmonary effort is normal.     Breath sounds: Normal breath sounds.  Abdominal:     General: Bowel sounds are normal.     Palpations: Abdomen is soft.     Tenderness: There is no abdominal tenderness.  Neurological:     Mental Status: She is alert.      Results for orders placed or performed in visit on 07/20/23  Glucose, capillary  Result Value Ref Range   Glucose-Capillary 190 (H) 70 - 99 mg/dL  POC Hbg V2Z  Result Value Ref Range   Hemoglobin A1C 6.3 (A) 4.0 - 5.6 %   HbA1c POC (<> result, manual entry)     HbA1c, POC (prediabetic range)     HbA1c, POC (controlled diabetic range)      Last CBC Lab Results  Component Value Date   WBC 7.1 03/10/2022   HGB 14.7 03/10/2022   HCT 44.1 03/10/2022   MCV 85 03/10/2022   MCH 28.2 03/10/2022   RDW 13.5 03/10/2022   PLT 331 03/10/2022   Last metabolic panel Lab Results  Component Value Date  GLUCOSE 176 (H) 01/26/2023   NA 140 01/26/2023   K 3.7 01/26/2023   CL 101 01/26/2023   CO2 21 01/26/2023   BUN 19 01/26/2023   CREATININE 0.75 01/26/2023   EGFR 94 01/26/2023   CALCIUM 9.6 01/26/2023   PROT 7.4 03/10/2022   ALBUMIN 4.5 03/10/2022   LABGLOB 2.9 03/10/2022   AGRATIO 1.6 03/10/2022   BILITOT <0.2 03/10/2022   ALKPHOS 81 03/10/2022   AST 14 03/10/2022   ALT 11 03/10/2022   ANIONGAP 10 03/10/2017   Last hemoglobin A1c Lab Results  Component Value Date   HGBA1C 6.3 (A) 07/20/2023      The 10-year ASCVD risk score (Arnett DK, et al., 2019) is: 14.1%    Assessment & Plan:   Problem List Items Addressed This Visit       Endocrine   Type 2 diabetes mellitus (HCC) - Primary (Chronic)    Patient has a history of T2DM and needs a repeat Hgb A1c: PLAN: -Repeat Hgb A1c today was 6.3% -Continue  metformin, atorvastatin, Azor -Follow up in three months to recheck Hbg A1c       Relevant Medications   atorvastatin (LIPITOR) 40 MG tablet   Other Relevant Orders   POC Hbg A1C (Completed)     Other   Hyperlipidemia   Relevant Medications   atorvastatin (LIPITOR) 40 MG tablet   Otalgia, bilateral    Patient presented today with 8/10 R>L otalgia that began 10 days ago. She was evaluated at an urgent care where they attempted to disimpact the R impacted cerumen but she was unable to tolerate the procedure and they recommended allegra, Flonase, and debrox ear drops. On exam today, the left tympanic membrane was visualized: no acute otis media or perforation observed. The right tympanic membrane was not able to be visualized during exam. The right external ear canal was partially obstructed. Current differential for partially obstructed right external ear canal is cholesteatoma vs partially dissolved ear wax vs otitis externa.  PLAN:  -ENT referral sent -7 day course of Augmentin BID sent for possible otitis externa in a diabetic patient. AVOID ciprofloxacin drops for now due to right tympanic membrane unable to be visualized - Avoid any ear drops, discontinue debrox drops -please return in one month to recheck  -Will hold on CRP and sed rate due to low suspicion for temporal arteritis        Relevant Orders   Ambulatory referral to ENT   Heartburn    Patient endorses an acidic feeling in her throat and also nausea after she eats. The nausea is likely from the heart burn but could be due to gastroparesis from T2DM.  PLAN: -Begin Protonix 40mg  once a day -If the nausea does not improve, will recommend gastroparesis evaluation -Will recheck in one month        Return in about 1 month (around 08/20/2023) for ear pain follow up.    Faith Rogue, DO

## 2023-07-20 NOTE — Progress Notes (Signed)
Patient was notified of results during encounter. Will continue current management.

## 2023-07-20 NOTE — Assessment & Plan Note (Signed)
Patient endorses an acidic feeling in her throat and also nausea after she eats. The nausea is likely from the heart burn but could be due to gastroparesis from T2DM.  PLAN: -Begin Protonix 40mg  once a day -If the nausea does not improve, will recommend gastroparesis evaluation -Will recheck in one month

## 2023-07-25 NOTE — Progress Notes (Signed)
Internal Medicine Clinic Attending  I was physically present during the key portions of the resident provided service and participated in the medical decision making of patient's management care. I reviewed pertinent patient test results.  The assessment, diagnosis, and plan were formulated together and I agree with the documentation in the resident's note.  Unclear the issues going on in patient's ear.  I think most likely she has otitis externa and partially dissolved wax.  However, we were not able to make much progress with debrox drops and clean out at urgent care.  Agree with referral to ENT for specific tools to clean out patient's ear and get a good look at TM, particularly on the right.   Inez Catalina, MD

## 2023-08-01 ENCOUNTER — Encounter: Payer: Medicaid Other | Admitting: Student

## 2023-08-09 ENCOUNTER — Ambulatory Visit (INDEPENDENT_AMBULATORY_CARE_PROVIDER_SITE_OTHER): Payer: Medicaid Other | Admitting: Internal Medicine

## 2023-08-09 ENCOUNTER — Telehealth: Payer: Self-pay | Admitting: *Deleted

## 2023-08-09 ENCOUNTER — Other Ambulatory Visit: Payer: Self-pay

## 2023-08-09 ENCOUNTER — Other Ambulatory Visit: Payer: Self-pay | Admitting: Internal Medicine

## 2023-08-09 VITALS — BP 158/97 | HR 75 | Temp 97.7°F | Ht 62.0 in | Wt 159.8 lb

## 2023-08-09 DIAGNOSIS — I10 Essential (primary) hypertension: Secondary | ICD-10-CM

## 2023-08-09 DIAGNOSIS — R300 Dysuria: Secondary | ICD-10-CM

## 2023-08-09 DIAGNOSIS — B370 Candidal stomatitis: Secondary | ICD-10-CM | POA: Diagnosis not present

## 2023-08-09 DIAGNOSIS — N3001 Acute cystitis with hematuria: Secondary | ICD-10-CM | POA: Diagnosis not present

## 2023-08-09 DIAGNOSIS — E119 Type 2 diabetes mellitus without complications: Secondary | ICD-10-CM

## 2023-08-09 LAB — POCT URINALYSIS DIPSTICK
Bilirubin, UA: NEGATIVE
Glucose, UA: NEGATIVE
Ketones, UA: NEGATIVE
Nitrite, UA: NEGATIVE
Protein, UA: POSITIVE — AB
Spec Grav, UA: 1.025 (ref 1.010–1.025)
Urobilinogen, UA: 0.2 E.U./dL
pH, UA: 7 (ref 5.0–8.0)

## 2023-08-09 MED ORDER — AMLODIPINE-OLMESARTAN 10-40 MG PO TABS
1.0000 | ORAL_TABLET | Freq: Every day | ORAL | 0 refills | Status: DC
Start: 2023-08-09 — End: 2023-08-09

## 2023-08-09 MED ORDER — METFORMIN HCL 1000 MG PO TABS
1000.0000 mg | ORAL_TABLET | Freq: Two times a day (BID) | ORAL | 3 refills | Status: DC
Start: 2023-08-09 — End: 2024-01-31

## 2023-08-09 MED ORDER — SPIRONOLACTONE 25 MG PO TABS
25.0000 mg | ORAL_TABLET | Freq: Every day | ORAL | 0 refills | Status: DC
Start: 2023-08-09 — End: 2023-08-09

## 2023-08-09 MED ORDER — SPIRONOLACTONE 25 MG PO TABS
25.0000 mg | ORAL_TABLET | Freq: Every day | ORAL | 3 refills | Status: DC
Start: 2023-08-09 — End: 2024-01-30

## 2023-08-09 MED ORDER — PHENAZOPYRIDINE HCL 200 MG PO TABS
200.0000 mg | ORAL_TABLET | Freq: Three times a day (TID) | ORAL | 0 refills | Status: AC | PRN
Start: 2023-08-09 — End: 2023-08-12

## 2023-08-09 MED ORDER — FLUCONAZOLE 150 MG PO TABS
150.0000 mg | ORAL_TABLET | Freq: Every day | ORAL | 0 refills | Status: DC
Start: 2023-08-09 — End: 2024-08-09

## 2023-08-09 MED ORDER — METFORMIN HCL 1000 MG PO TABS
1000.0000 mg | ORAL_TABLET | Freq: Two times a day (BID) | ORAL | 0 refills | Status: DC
Start: 2023-08-09 — End: 2023-08-09

## 2023-08-09 MED ORDER — CEPHALEXIN 500 MG PO CAPS
500.0000 mg | ORAL_CAPSULE | Freq: Four times a day (QID) | ORAL | 0 refills | Status: AC
Start: 2023-08-09 — End: 2023-08-19

## 2023-08-09 MED ORDER — AMLODIPINE-OLMESARTAN 10-40 MG PO TABS
1.0000 | ORAL_TABLET | Freq: Every day | ORAL | 3 refills | Status: DC
Start: 2023-08-09 — End: 2024-01-30

## 2023-08-09 NOTE — Assessment & Plan Note (Signed)
2 days painful urination, no gross blood, accompanied by nausea without vomiting and low-grade left greater than right back pain.  She is able to eat and drink and does not feel feverish or chilled.  Exam for mild CVAT left greater than right and significant discomfort with suprapubic palpation.  Urinalysis pH 7, moderate blood, large leukocytes and negative nitrites.  pH 7.  Will send for culture, as she just completed a course of Augmentin for empiric management of possible otitis media.  Treat with cephalexin with instructions for return if she continues to worsen.

## 2023-08-09 NOTE — Progress Notes (Signed)
Chief complaint: Painful urination  Walk-in visit for this Arabic speaking 57 year old woman living with well-controlled type II DM (on no SGLT2 i) presenting with days of discomfort when voiding.  She was recently seen on 07/20/2023 in our clinic with concerns about ear discomfort and was found to have cerumen impaction for which a referral to ENT was placed; empirically treated with Augmentin to cover for possible otitis media given inability to visualize TM.  She was also experiencing dyspepsia and was placed on PPI.  She is scheduled with Dr. Allena Katz in [facility] on 08/23/2023 to follow-up these problems.  Her last 21-month chronic disease follow-up with was in 05/2023.  Experiencing few days of painful urination, no visualized blood, no change in urine appearance.  Feels discomfort in bilateral back, though doesn't feel ill - no known fever, no chills,nausea,change in appetite, or change in bowel habits.  She feels similar to a diagnosis years ago of a UTI.    On inquiry, she has not had any vaginal discharge nor vaginal itching (possibility of candidal vaginitis following broad spectrum abx) though she does endorse white patches in her mouth.  Ear discomfort present at recent visit has improved.  Patient Active Problem List   Diagnosis Date Noted   Otalgia, bilateral 07/20/2023   Heartburn 07/20/2023   Sore throat 06/27/2023   Acne 04/13/2023   Breast pain, left 03/23/2023   Cough 08/24/2022   Dry eye 04/22/2022   Unintentional weight loss 03/10/2022   Trapezius muscle spasm 11/16/2021   Hyperlipidemia 10/22/2021   Screening for malignant neoplasm of cervix 09/30/2021   Left knee pain 09/11/2021   Fatigue 09/11/2021   Bacterial sinusitis 03/02/2021   Type 2 diabetes mellitus (HCC) 09/07/2017   Hypokalemia 02/02/2017   Healthcare maintenance 01/10/2013   Skin lesion 11/14/2012   UTI (urinary tract infection) 02/29/2012   Essential hypertension 08/29/2007   Current Outpatient Medications   Medication Instructions   Accu-Chek Softclix Lancets lancets Use as instructed   amLODipine-olmesartan (AZOR) 10-40 MG tablet 1 tablet, Oral, Daily   atorvastatin (LIPITOR) 40 mg, Oral, Daily   blood glucose meter kit and supplies Dispense based on patient and insurance preference. Use up to four times daily as directed. (FOR ICD-10 E10.9, E11.9).   Blood Glucose Monitoring Suppl (FREESTYLE LITE) w/Device KIT 1 each, Does not apply, Daily   carbamide peroxide (DEBROX) 6.5 % OTIC solution 5 drops, Right EAR, Daily   cyclobenzaprine (FLEXERIL) 5 MG tablet Oral   diclofenac Sodium (VOLTAREN) 1 % GEL APPLY 4 GRAMS TOPICALLY 4 TIMES DAILY   fexofenadine (ALLEGRA) 180 mg, Oral, Daily at bedtime   fluticasone (FLONASE) 50 MCG/ACT nasal spray 1 spray, Each Nare, 2 times daily   glucose blood (FREESTYLE LITE) test strip Use as instructed   Lancets (FREESTYLE) lancets Use as instructed   magnesium oxide (MAG-OX) 400 MG tablet Oral   metFORMIN (GLUCOPHAGE) 1,000 mg, Oral, 2 times daily with meals   pantoprazole (PROTONIX) 40 mg, Oral, Daily   Polyethyl Glycol-Propyl Glycol (LUBRICANT EYE DROPS) 0.4-0.3 % SOLN 1 drop, Ophthalmic, 2 times daily PRN   potassium chloride (KLOR-CON) 10 MEQ tablet 10 mEq, Oral, Daily   Semaglutide(0.25 or 0.5MG /DOS) 0.5 mg, Subcutaneous, Weekly   spironolactone (ALDACTONE) 25 mg, Oral, Daily    BP (!) 158/97 (BP Location: Right Arm, Patient Position: Sitting, Cuff Size: Small)   Pulse 75   Temp 97.7 F (36.5 C) (Oral)   Ht 5\' 2"  (1.575 m)   Wt 159 lb 12.8 oz (72.5  kg)   LMP 05/23/2019 Comment: abnormal  SpO2 100%   BMI 29.23 kg/m  Well-appearing nicely dressed and groomed woman with easy smile and easy engagement with examiner and virtual interpreter.   Ears - partial occlusion each canal with normal appearing Tms, no external canal dermatitis. OP - moist pink mucosa with scattered white patches bilateral buccal surface, none on poterior pharynx. Back - mild  discomfort with gentle percussion over L>R CVAs. Paraspinal muscles seem to be tender without palpable spasm or asymmetry. Abd/pelvis - Nml bowel sounds, non distended, no tenderness except for suprapubic.  Skin turgor normal.   Dipstick (+) blood, large leuks (-) nitrite  Assessment and plan:  Urinary tract infection 2 days painful urination, no gross blood, accompanied by nausea without vomiting and low-grade left greater than right back pain.  She is able to eat and drink and does not feel feverish or chilled.  Exam for mild CVAT left greater than right and significant discomfort with suprapubic palpation.  Urinalysis pH 7, moderate blood, large leukocytes and negative nitrites.  pH 7.  Will send for culture, as she just completed a course of Augmentin for empiric management of possible otitis media.  Treat with cephalexin with instructions for return if she continues to worsen.  Oral thrush Secondary to recent Augmentin, mild case. Glucose well controlled, not on SGLT2.  Tx with fluconazole.

## 2023-08-09 NOTE — Patient Instructions (Signed)
[name],  You have an infection in your bladder which will require antibiotic, and a yeast infection (called thrush) which will require an antifungal.  Please take your medicine as directed.  You have a visit coming up on 9/3 to recheck your symptoms.  Please return or go to ER if you get worse!  Be well,  Dr. Mayford Knife

## 2023-08-09 NOTE — Telephone Encounter (Signed)
Patient walked in to Clinic c/o  frequency and pain on urination x 2 days.  No fevers.  Pain in lower back.  Pain at a level 6.

## 2023-08-10 LAB — URINALYSIS
Bilirubin, UA: NEGATIVE
Glucose, UA: NEGATIVE
Ketones, UA: NEGATIVE
Nitrite, UA: NEGATIVE
Specific Gravity, UA: 1.018 (ref 1.005–1.030)
Urobilinogen, Ur: 0.2 mg/dL (ref 0.2–1.0)
pH, UA: 6.5 (ref 5.0–7.5)

## 2023-08-11 LAB — URINE CULTURE

## 2023-08-12 NOTE — Assessment & Plan Note (Signed)
Secondary to recent Augmentin, mild case. Glucose well controlled, not on SGLT2.  Tx with fluconazole.

## 2023-08-23 ENCOUNTER — Encounter: Payer: Medicaid Other | Admitting: Student

## 2023-08-23 NOTE — Progress Notes (Deleted)
CC: Follow up visit   HPI:  Ashley Huynh is a 57 y.o.   Medications: Hypertension: Spironolactone 25 mg daily, amlodipine-olmesartan 10-40 mg daily Type 2 diabetes: Ozempic 0.5 mg weekly, metformin 1000 mg twice daily Hyperlipidemia: Atorvastatin 40 mg daily Hypokalemia: KCl 10 mill equivalents daily GERD: 40 mg daily  Most recent office visit 07/31/724: Patient was given Augmentin for otitis externa and referred to ENT. Patient complained of heartburn, and was started on Protonix  Labs: 07/20/2023: A1c 6.3  01/26/2023 BMP: Glucose 176, creatinine 0.75, sodium 140, potassium 3.7, bicarb 21  The patient recently had UA showing leukocytes, with concerns of urinary Frequency and dysuria.  Unclear if patient was given antibiotics or not.  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   Diabetic foot ulcer associated with type 2 diabetes mellitus (HCC) 01/26/2021   Essential tremor 04/24/2013   Hyperlipidemia    Hypertension    Hypokalemia    Migraine 12/31/2013   T2DM (type 2 diabetes mellitus) (HCC) 09/13/2017     Current Outpatient Medications:    Accu-Chek Softclix Lancets lancets, Use as instructed, Disp: 100 each, Rfl: 12   amLODipine-olmesartan (AZOR) 10-40 MG tablet, Take 1 tablet by mouth daily., Disp: 90 tablet, Rfl: 3   atorvastatin (LIPITOR) 40 MG tablet, Take 1 tablet (40 mg total) by mouth daily., Disp: 90 tablet, Rfl: 3   blood glucose meter kit and supplies, Dispense based on patient and insurance preference. Use up to four times daily as directed. (FOR ICD-10 E10.9, E11.9)., Disp: 1 each, Rfl: 0   Blood Glucose Monitoring Suppl (FREESTYLE LITE) w/Device KIT, 1 each by Does not apply route daily., Disp: 1 kit, Rfl: 1   carbamide peroxide (DEBROX) 6.5 % OTIC solution, Place 5 drops into the right ear daily., Disp: 15 mL, Rfl: 0   cyclobenzaprine (FLEXERIL) 5 MG tablet, Take by mouth.,  Disp: , Rfl:    diclofenac Sodium (VOLTAREN) 1 % GEL, APPLY 4 GRAMS TOPICALLY 4 TIMES DAILY, Disp: 350 g, Rfl: 0   fexofenadine (ALLEGRA) 180 MG tablet, Take 1 tablet (180 mg total) by mouth at bedtime., Disp: 30 tablet, Rfl: 3   fluconazole (DIFLUCAN) 150 MG tablet, Take 1 tablet (150 mg total) by mouth daily., Disp: 1 tablet, Rfl: 0   fluticasone (FLONASE) 50 MCG/ACT nasal spray, Place 1 spray into both nostrils in the morning and at bedtime., Disp: 16 g, Rfl: 0   glucose blood (FREESTYLE LITE) test strip, Use as instructed, Disp: 100 each, Rfl: 12   Lancets (FREESTYLE) lancets, Use as instructed, Disp: 100 each, Rfl: 12   magnesium oxide (MAG-OX) 400 MG tablet, Take by mouth., Disp: , Rfl:    metFORMIN (GLUCOPHAGE) 1000 MG tablet, Take 1 tablet (1,000 mg total) by mouth 2 (two) times daily with a meal., Disp: 180 tablet, Rfl: 3   pantoprazole (PROTONIX) 40 MG tablet, Take 1 tablet (40 mg total) by mouth daily., Disp: 30 tablet, Rfl: 0   Polyethyl Glycol-Propyl Glycol (LUBRICANT EYE DROPS) 0.4-0.3 % SOLN, Apply 1 drop to eye 2 (two) times daily as needed., Disp: 4 mL, Rfl: 3   potassium chloride (KLOR-CON) 10 MEQ tablet, Take 1 tablet by mouth once daily, Disp: 90 tablet, Rfl: 0   Semaglutide,0.25 or 0.5MG /DOS, 2 MG/3ML SOPN, Inject 0.5 mg into the skin once a week., Disp: 9 mL, Rfl: 1   spironolactone (ALDACTONE) 25 MG tablet, Take 1 tablet (  25 mg total) by mouth daily., Disp: 90 tablet, Rfl: 3  Review of Systems:  ***  Constitutional: Eye: Respiratory: Cardiovascular: GI: MSK: GU: Skin: Neuro: Endocrine:   Physical Exam:  There were no vitals filed for this visit. *** General: Patient is sitting comfortably in the room  Eyes: Pupils equal and reactive to light, EOM intact  Head: Normocephalic, atraumatic  Neck: Supple, nontender, full range of motion, No JVD Cardio: Regular rate and rhythm, no murmurs, rubs or gallops. 2+ pulses to bilateral upper and lower extremities   Chest: No chest tenderness Pulmonary: Clear to ausculation bilaterally with no rales, rhonchi, and crackles  Abdomen: Soft, nontender with normoactive bowel sounds with no rebound or guarding  Neuro: Alert and orientated x3. CN II-XII intact. Sensation intact to upper and lower extremities. 2+ patellar reflex.  Back: No midline tenderness, no step off or deformities noted. No paraspinal muscle tenderness.  Skin: No rashes noted  MSK: 5/5 strength to upper and lower extremities.    Assessment & Plan:   No problem-specific Assessment & Plan notes found for this encounter.    Patient {GC/GE:[id number]::"discussed with","seen with"} Dr. {NAMES:[id number]::"Guilloud","Hoffman","Mullen","Narendra","Williams","Vincent"}  Modena Slater, DO PGY-2 Internal Medicine Resident  Pager: [phone]

## 2023-09-16 ENCOUNTER — Institutional Professional Consult (permissible substitution) (INDEPENDENT_AMBULATORY_CARE_PROVIDER_SITE_OTHER): Payer: Medicaid Other | Admitting: Otolaryngology

## 2023-10-04 ENCOUNTER — Other Ambulatory Visit: Payer: Self-pay | Admitting: *Deleted

## 2023-10-04 ENCOUNTER — Ambulatory Visit (INDEPENDENT_AMBULATORY_CARE_PROVIDER_SITE_OTHER): Payer: Medicaid Other | Admitting: Otolaryngology

## 2023-10-04 ENCOUNTER — Telehealth: Payer: Self-pay

## 2023-10-04 ENCOUNTER — Encounter (INDEPENDENT_AMBULATORY_CARE_PROVIDER_SITE_OTHER): Payer: Self-pay | Admitting: Otolaryngology

## 2023-10-04 VITALS — BP 156/99 | HR 85 | Ht 62.0 in | Wt 162.0 lb

## 2023-10-04 DIAGNOSIS — H9203 Otalgia, bilateral: Secondary | ICD-10-CM

## 2023-10-04 DIAGNOSIS — H9193 Unspecified hearing loss, bilateral: Secondary | ICD-10-CM

## 2023-10-04 DIAGNOSIS — R0981 Nasal congestion: Secondary | ICD-10-CM | POA: Diagnosis not present

## 2023-10-04 DIAGNOSIS — E119 Type 2 diabetes mellitus without complications: Secondary | ICD-10-CM

## 2023-10-04 DIAGNOSIS — J3489 Other specified disorders of nose and nasal sinuses: Secondary | ICD-10-CM | POA: Diagnosis not present

## 2023-10-04 DIAGNOSIS — H6121 Impacted cerumen, right ear: Secondary | ICD-10-CM | POA: Diagnosis not present

## 2023-10-04 DIAGNOSIS — J3089 Other allergic rhinitis: Secondary | ICD-10-CM | POA: Diagnosis not present

## 2023-10-04 DIAGNOSIS — M26623 Arthralgia of bilateral temporomandibular joint: Secondary | ICD-10-CM

## 2023-10-04 MED ORDER — FLUTICASONE PROPIONATE 50 MCG/ACT NA SUSP: 2.0000 | Freq: Two times a day (BID) | NASAL | 6 refills | Status: DC

## 2023-10-04 MED ORDER — CETIRIZINE HCL 10 MG PO TABS: 10.0000 mg | ORAL_TABLET | Freq: Every day | ORAL | 11 refills | Status: DC

## 2023-10-04 NOTE — Progress Notes (Signed)
ENT CONSULT:  Reason for Consult: bilateral otalgia    HPI: Ashley Huynh is an 57 y.o. female Arabic speaking, with hx headache and absence seizures, here for bilateral ear pain x 6 months.  She had bilateral ear pain for a while, was seen at the Urgent care, was given abx and it helped somewhat, but returned after she finished abx and ear drops. She reports muffled hearing. Pain started on the right and then transitioned to the left. She had right sided ear drainage, which at this point resolved. She feels sensation of fluid in her ears. She has bilateral pain even now, worse on the right. No hx of ear surgeries in the past. 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 She clenches her teeth at night.    Records Reviewed:  ED note  07/11/2023- dx of eustachian tube dysfunction  Patient presents today companied by her daughter who provides the majority of history as well as translation. Reports for the past 3 to 4 days she has had intermittent ear pain. This started on the left but then is spread to the right. Pain is rated 9 on a 0-10 pain scale, intermittent without identifiable trigger, described as shooting, no alleviating factors notified. She has tried Tylenol without improvement of symptoms. She does have a history of seasonal allergies typically managed with cetirizine and fluticasone nasal spray but has not been using them regularly. Denies any recent antibiotics or steroids. She denies any recent airplane travel or swimming. Denies any additional symptoms including cough, congestion, fever, nausea, vomiting. She does not use anything in the ear.   Patient is well-appearing, afebrile, nontoxic, nontachycardic. No evidence of acute infection on physical exam that would warrant initiation of antibiotics. She did have cerumen impaction on right which we attempted to irrigate. We were somewhat successful and I was able to visualize TM that appears normal but did not  remove all of the wax as patient had difficulty tolerating this procedure. She was given Debrox to help manage symptoms and instructed to follow-up with ENT to consider additional wax removal if symptoms persist.     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 She has never used smokeless tobacco. She reports that she does not drink alcohol and does not use drugs.  Allergies:  Allergies  Allergen Reactions   Other Other (See Comments)    No pork No pork    Medications: I have reviewed the patient's current medications.  The PMH, PSH, Medications, Allergies, and SH were reviewed and updated.  ROS: Constitutional: Negative for fever, weight loss and weight gain. Cardiovascular: Negative for chest pain and dyspnea on exertion. Respiratory: Is not experiencing shortness of breath at  rest. Gastrointestinal: Negative for nausea and vomiting. Neurological: Negative for headaches. Psychiatric: The patient is not nervous/anxious  Blood pressure (!) 156/99, pulse 85, height 5\' 2"  (1.575 m), weight 162 lb (73.5 kg), last menstrual period 05/23/2019, SpO2 97%.  PHYSICAL EXAM:  Exam: General: Well-developed, well-nourished Respiratory Respiratory effort: Equal inspiration and expiration without stridor Cardiovascular Peripheral Vascular: Warm extremities with  equal color/perfusion Eyes: No nystagmus with equal extraocular motion bilaterally Neuro/Psych/Balance: Patient oriented to person, place, and time; Appropriate mood and affect; Gait is intact with no imbalance; Cranial nerves I-XII are intact Head and Face Inspection: Normocephalic and atraumatic without mass or lesion Palpation: Facial skeleton intact without bony stepoffs Salivary Glands: No mass or tenderness Facial Strength: Facial motility symmetric and full bilaterally ENT Pinna: External ear intact and fully developed External canal: cerumen impaction right side, removed. Canal is patent with intact skin Tympanic Membrane: Clear and mobile External Nose: No scar or anatomic deformity Internal Nose: Septum intact and midline. No edema, polyp, or rhinorrhea Lips, Teeth, and gums: Mucosa and teeth intact and viable TMJ: No pain to palpation with full mobility Oral cavity/oropharynx: No erythema or exudate, no lesions present Neck Neck and Trachea: Midline trachea without mass or lesion Thyroid: No mass or nodularity Lymphatics: No lymphadenopathy  Procedure: Procedure: Cerumen Removal, right (CPT [phone])  Diagnosis: cerumen impaction, right  Informed consent: Timeout performed and informed consent was obtained.  Procedure: Cerumen curette, speculum and suction were employed to clear the cerumen.   Findings: Normal appearing tympanic membranes and external canals are normal after removal of cerumen.No middle ear fluid bilaterally.   Complications: None. Patient tolerated well.   Studies Reviewed:MRI brain 11/15/2012 Clinical Data: Absence seizures.  Right-sided headaches and  tingling in her right arm.   MRI HEAD WITHOUT AND WITH CONTRAST   Technique:  Multiplanar, multiecho pulse sequences of the brain and  surrounding structures were obtained according to standard protocol  without and with intravenous contrast   Contrast: 13mL MULTIHANCE GADOBENATE DIMEGLUMINE 529  MG/ML IV SOLN   Comparison: CT head without contrast 03/07/2012.  MRI brain without  with contrast 01/10/2006.   Findings: No acute infarct, hemorrhage, or mass lesion is present  the ventricles are of normal size.  No significant extra-axial  fluid collection is present.  There is no significant white matter  disease.   The postcontrast images demonstrate no pathologic enhancement.  Flow is present in the major intracranial arteries.  The globes and  orbits are intact.   A large polyp or mucous retention cyst in the right maxillary sinus  is stable.  The paranasal sinuses and mastoid air cells are  otherwise clear.   IMPRESSION:   1. Normal MRI appearance of the brain.  No acute or focal  abnormality to explain the patient's symptoms.  2.  Stable polyp or mucous retention cyst of the right maxillary  sinus.     Assessment/Plan: Encounter Diagnoses  Name Primary?   Decreased hearing of both ears Yes   Otalgia, bilateral    Bilateral temporomandibular joint pain    Nasal congestion    Nasal obstruction    Environmental and seasonal allergies    Impacted cerumen of right ear [H61.21]    Decreased hearing in the setting of right cerumen impaction, cleared today  - aside from cerumen, normal ear exam - Audiogram  2. B/l chronic otalgia - normal ear exam, but tender at the TMJ b/l worse on the right  - we discussed management of TMJ  and detailed handout was provided  3. Nasal congestion and suspected environmental allergies and prior diagnosis of eustachian tube dysfunction  - information provided - start Zyrtec 10 mg daily and Flonase 2 puffs b/l nares BID  4. Itchy ears  Sweet Oil PRN  RTC after testing in 2 months  Thank you for allowing me to participate in the care of this patient. Please do not hesitate to contact me with any questions or concerns.   Ashok Croon, MD Otolaryngology [facility] Phone: [phone] Fax:  [phone]    10/04/2023, 12:45 PM

## 2023-10-04 NOTE — Telephone Encounter (Signed)
Next appt scheduled 10/31 with Dr Carlynn Purl.

## 2023-10-04 NOTE — Telephone Encounter (Signed)
Decision:Approved  Ashley Huynh (Key: [id number]) Rx #: [id number] Ozempic (0.25 or 0.5 MG/DOSE) 2MG /3ML pen-injectors Form [employer] Medicaid Electronic Prior Authorization Form Message from Plan Approved. OZEMPIC (0.25 OR 0.5 MG/DOSE) Soln Pen-inj 2MG /3ML is approved from 10/04/2023 to 10/03/2024.Marland Kitchen Authorization Expiration Date: October 03, 2024.

## 2023-10-04 NOTE — Patient Instructions (Addendum)
-   Use Sweet Oil, product available over the counter for itchy ears - schedule hearing  - return after testing  - start Flonase and Claritin (stop Allegra)  See information about Eustachian Tube Dysfunction below:    Overview The eustachian (say "you-STAY-shee-un") tubes connect the middle ear on each side to the back of the throat. They keep air pressure stable in the ears. If your eustachian tubes become blocked, the air pressure in your ears changes. A quick change in air pressure can cause eustachian tubes to close up. This might happen when an airplane changes altitude or when a scuba diver goes up or down underwater. And a cold can make the tubes swell and block the fluid in the middle ear from draining out. That can cause pain.  Eustachian tube problems often clear up on their own or after treating the cause of the blockage. If your tubes continue to be blocked, you may need surgery.  Follow-up care is a key part of your treatment and safety. Be sure to make and go to all appointments, and call your doctor or nurse advice line (811 in most provinces and territories) if you are having problems. It's also a good idea to know your test results and keep a list of the medicines you take.  How can you care for yourself at home? Try a simple exercise to help open blocked tubes. Close your mouth, hold your nose, and gently blow as if you are blowing your nose. Yawning and chewing gum also may help. You may hear or feel a "pop" when the tubes open. To ease ear pain, apply a warm face cloth or a heating pad set on low. There may be some drainage from the ear when the heat melts earwax. Put a cloth between the heat source and your skin. If your doctor prescribed antibiotics, take them as directed. Do not stop taking them just because you feel better. You need to take the full course of antibiotics. Be safe with medicines. Depending on the cause of the problem, your doctor may recommend  over-the-counter medicine. For example, adults may try decongestants for cold symptoms or nasal spray steroids for allergies. Follow the instructions carefully.   TMJ (Temporomandibular Joint Syndrome) The temporomandibular (tem-puh-roe-man-DIB-u-lur) joint (TMJ) acts like a sliding hinge, connecting your jawbone to your skull. You have one joint on each side of your jaw. TMJ disorders -- a type of temporomandibular disorder or TMD -- can cause pain in your jaw joint and in the muscles that control jaw movement.  The exact cause of a person's TMJ disorder is often difficult to determine. Your pain may be due to a combination of factors, such as genetics, arthritis or jaw injury. Some people who have jaw pain also tend to clench or grind their teeth (bruxism), although many people habitually clench or grind their teeth and never develop TMJ disorders.  In most cases, the pain and discomfort associated with TMJ disorders is temporary and can be relieved with self-managed care or nonsurgical treatments. This includes stress reduction, softer diet when the pain is present, anti-inflammatory pain medications such as Motrin and warm compresses.

## 2023-10-04 NOTE — Telephone Encounter (Signed)
Prior Authorization for patient (Ozempic 0.25 or 0.5MG ) came through on cover my meds was submitted with last office notes and labs awaiting approval or denial.  [id number])

## 2023-10-11 ENCOUNTER — Ambulatory Visit (INDEPENDENT_AMBULATORY_CARE_PROVIDER_SITE_OTHER): Payer: BLUE CROSS/BLUE SHIELD | Admitting: Audiology

## 2023-10-20 ENCOUNTER — Encounter: Payer: Self-pay | Admitting: Internal Medicine

## 2023-10-20 ENCOUNTER — Ambulatory Visit (INDEPENDENT_AMBULATORY_CARE_PROVIDER_SITE_OTHER): Payer: Medicaid Other | Admitting: Audiology

## 2023-10-20 ENCOUNTER — Ambulatory Visit: Payer: Medicaid Other | Admitting: Internal Medicine

## 2023-10-20 VITALS — BP 137/80 | HR 90 | Temp 98.1°F | Ht 62.0 in | Wt 169.0 lb

## 2023-10-20 DIAGNOSIS — R4589 Other symptoms and signs involving emotional state: Secondary | ICD-10-CM | POA: Insufficient documentation

## 2023-10-20 DIAGNOSIS — I10 Essential (primary) hypertension: Secondary | ICD-10-CM | POA: Diagnosis not present

## 2023-10-20 DIAGNOSIS — Z7984 Long term (current) use of oral hypoglycemic drugs: Secondary | ICD-10-CM

## 2023-10-20 DIAGNOSIS — Z7985 Long-term (current) use of injectable non-insulin antidiabetic drugs: Secondary | ICD-10-CM

## 2023-10-20 DIAGNOSIS — F32A Depression, unspecified: Secondary | ICD-10-CM | POA: Diagnosis not present

## 2023-10-20 DIAGNOSIS — E119 Type 2 diabetes mellitus without complications: Secondary | ICD-10-CM

## 2023-10-20 DIAGNOSIS — Z0110 Encounter for examination of ears and hearing without abnormal findings: Secondary | ICD-10-CM

## 2023-10-20 LAB — POCT GLYCOSYLATED HEMOGLOBIN (HGB A1C): Hemoglobin A1C: 6.6 % — AB (ref 4.0–5.6)

## 2023-10-20 LAB — GLUCOSE, CAPILLARY: Glucose-Capillary: 276 mg/dL — ABNORMAL HIGH (ref 70–99)

## 2023-10-20 MED ORDER — SERTRALINE HCL 25 MG PO TABS
25.0000 mg | ORAL_TABLET | Freq: Every day | ORAL | 2 refills | Status: DC
Start: 2023-10-20 — End: 2024-01-30

## 2023-10-20 NOTE — Patient Instructions (Addendum)
Ms Ditsworth,   It was a pleasure meeting you today.   For your diabetes, we will continue your current medications. Please keep taking your semaglutide once per week.   I am starting a new medication called Zoloft. As we discussed, the main side effect is nausea or GI symptoms. We will plan on seeing you again in about a month to see how this medication is working.   Thanks,  Dr Carlynn Purl    ??? ??? ?? ????? ????? ??????? ?????.   ??????? ???? ?????? ????? ??? ??? ????? ?? ????? ?????? ???????. ???? ????????? ?? ????? ?????????? ??? ????? ?? ???????.   ??? ???? ?????? ???? ???? ???? ??????. ??? ??????? ??????? ??????? ??????? ?? ??????? ?? ????? ?????? ??????. ????? ?????? ??? ???? ???? ??? ??????? ???? ??? ???? ??? ??????.   ??????  ????? ????? alsayidat khujali laqad kan min dawaei sururi muqabalatuk alyawma. bialnisbat limarad alsukari alkhasi bika, sawf nastamiru fi tanawul 'adwiatik alhaliati. yurjaa alaistimrar fi tanawul simajlutid maratan wahidatan fi al'usbuei. 'ana 'abda bitanawul dawa' jadid yusamaa zuluft. Sable Feil, altaathir aljanibii alrayiysii hu alghathayan 'aw 'aerad aljihaz alhadmi. sanukhatit liruyatik maratan 'ukhraa khilal shahr tqryban linaraa kayf yaemal hadha aldawa'u. shkran, duktur Lavell Islam

## 2023-10-20 NOTE — Progress Notes (Signed)
  [address] [place], [place] [address] [phone]  Audiological Evaluation    Name: Ashley Huynh     DOB:   10-05-1966      MRN:   [id number]                                                                                     Service Date: 10/20/2023     Accompanied by: daughter, who served as interpreter per patient's preference   Patient comes today after Dr. Irene Pap, ENT sent a referral for a hearing evaluation due to concerns with hearing loss.   Symptoms Yes Details  Hearing loss  [x]  Right ear  Tinnitus  [x]  Fluttering sounds, worse in the left ear   Ear pain/ Ear infections  [x]  Reports intermittent right ear pain  Balance problems  [x]  Somewhat light headed sometimes when standing up  Noise exposure  [x]  Occupational- warehouse  Previous ear surgeries  [x]  When she was 57 years old she had surgery in her right auricle  Family history  []    Amplification  []    Other  []      Otoscopy: Right ear: Essentially clear external ear canals and notable landmarks visualized on the tympanic membrane. Left ear:  Essentially clear external ear canals and notable landmarks visualized on the tympanic membrane.  Tympanometry: Right ear: Type A- Normal external ear canal volume with normal middle ear pressure and tympanic membrane compliance. Left ear: Type A- Normal external ear canal volume with normal middle ear pressure and tympanic membrane compliance.    Pure tone Audiometry:  Normal hearing from [phone] Hz, in both ears.    The hearing test results were completed under headphones and results are deemed to be of good reliability. Test technique:  conventional     Speech Audiometry: Right ear- Speech Reception Threshold (SRT) was obtained at 15 dBHL Left ear-Speech Reception Threshold (SRT) was obtained at 20 dBHL   Word Recognition Score Tested using NU-6 (MLV) Cold not test due to language barrier.  Impression: There is not a significant  difference in pure-tone thresholds between ears.   Recommendations: Follow up with ENT as scheduled. Return for a hearing evaluation if concerns with hearing changes arise or per MD recommendation.   Umaima Scholten MARIE LEROUX-MARTINEZ, AUD

## 2023-10-20 NOTE — Progress Notes (Signed)
Subjective:  CC: DM follow-up, Depressed mood  HPI:  [name] is a 57 y.o. female with a past medical history stated below and presents today for above. Please see problem based assessment and plan for additional details.  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 90 tablet 3   atorvastatin (LIPITOR) 40 MG tablet Take 1 tablet (40 mg total) by mouth daily. 90 tablet 3   blood glucose meter kit and supplies Dispense based on patient and insurance preference. Use up to four times daily as directed. (FOR ICD-10 E10.9, E11.9). 1 each 0   Blood Glucose Monitoring Suppl (FREESTYLE LITE) w/Device KIT 1 each by Does not apply route daily. 1 kit 1   carbamide peroxide (DEBROX) 6.5 % OTIC solution Place 5 drops into the right ear daily. 15 mL 0   cetirizine (ZYRTEC) 10 MG tablet Take 1 tablet (10 mg total) by mouth daily. 30 tablet 11   cyclobenzaprine (FLEXERIL) 5 MG tablet Take by mouth.     diclofenac Sodium (VOLTAREN) 1 % GEL APPLY 4 GRAMS TOPICALLY 4 TIMES DAILY 350 g 0   fluconazole (DIFLUCAN) 150 MG tablet Take 1 tablet (150 mg total) by mouth daily. 1 tablet 0   fluticasone (FLONASE) 50 MCG/ACT nasal spray Place 2 sprays into both nostrils 2 (two) times daily. 16 g 6   glucose blood (FREESTYLE LITE) test strip Use as instructed 100 each 12   Lancets (FREESTYLE) lancets Use as instructed 100 each 12   magnesium oxide (MAG-OX) 400 MG tablet Take by mouth.      metFORMIN (GLUCOPHAGE) 1000 MG tablet Take 1 tablet (1,000 mg total) by mouth 2 (two) times daily with a meal. 180 tablet 3   pantoprazole (PROTONIX) 40 MG tablet Take 1 tablet (40 mg total) by mouth daily. 30 tablet 0   Polyethyl Glycol-Propyl Glycol (LUBRICANT EYE DROPS) 0.4-0.3 % SOLN Apply 1 drop to eye 2 (two) times daily as needed. 4 mL 3   potassium chloride (KLOR-CON) 10 MEQ tablet Take 1 tablet by mouth once daily 90 tablet 0   Semaglutide,0.25 or 0.5MG /DOS, 2 MG/3ML SOPN Inject 0.5 mg into the skin once a week. 9 mL 1   spironolactone (ALDACTONE) 25 MG tablet Take 1 tablet (25 mg total) by mouth daily. 90 tablet 3   [DISCONTINUED] lisinopril-hydrochlorothiazide (PRINZIDE) 20-12.5 MG per tablet Take 2 tablets by mouth daily. 60 tablet 11   No current facility-administered medications on file prior to visit.    Review of Systems: ROS negative except for as is noted on the assessment and plan.  Objective:   Vitals:   10/20/23 1525  BP: 137/80  Pulse: 90  Temp: 98.1 F (36.7 C)  TempSrc: Oral  SpO2: 97%  Weight: 169 lb (76.7 kg)  Height: 5\' 2"  (1.575 m)    Physical Exam: Constitutional: well-appearing, in no acute distress HENT: normocephalic atraumatic, mucous membranes moist Eyes: conjunctiva non-erythematous Neck: supple Cardiovascular: regular  rate and rhythm, no m/r/g Pulmonary/Chest: normal work of breathing on room air, lungs clear to auscultation bilaterally Abdominal: soft, non-tender, non-distended MSK: normal bulk and tone Neurological: alert & oriented x 3, 5/5 strength in bilateral upper and lower extremities, normal gait Skin: warm and dry Psych: tearful  Assessment & Plan:   Depressed mood Patient reports depressed mood, difficulty sleeping, poor concentration, and decreased appetite for the past month. She states her symptoms began with some family difficulties that started last month. Her PHQ9 score is 24. Denies SI/HI. She has never felt depressed,  manic, or been on antidepressant therapy before. She will be starting meeting with a therapist next week, but also wishes to start on antidepressant therapy. Discussed the risks/benefits and side effects of SSRI. -Start Zoloft 25mg  daily -Follow up in 1 month   Type 2 diabetes mellitus (HCC) A1c at last visit was 6.3, today 6.6. Patient has been taking metformin 1000mg  BID, but has not been taking semaglutide for the past month due to her depressed mood and family difficulties. Recommended continuing current medications. Patient stated she will be sure to take her semaglutide weekly.  -Continue metformin 1000mg  BID, semaglutide 0.25 weekly  Essential hypertension BP today is 137/80, at goal. -Continue current regimen of amlodipine-olmesartan 10-40    Patient seen with Dr. Alan Ripper MD [facility] Internal Medicine  PGY-1 Pager: [phone] Date 10/20/2023  Time 6:52 PM

## 2023-10-20 NOTE — Assessment & Plan Note (Signed)
BP today is 137/80, at goal. -Continue current regimen of amlodipine-olmesartan 10-40

## 2023-10-20 NOTE — Assessment & Plan Note (Signed)
>>  ASSESSMENT AND PLAN FOR DEPRESSED MOOD WRITTEN ON 10/20/2023  6:48 PM BY Jayson Michael, MD  Patient reports depressed mood, difficulty sleeping, poor concentration, and decreased appetite for the past month. She states her symptoms began with some family difficulties that started last month. Her PHQ9 score is 24. Denies SI/HI. She has never felt depressed, manic, or been on antidepressant therapy before. She will be starting meeting with a therapist next week, but also wishes to start on antidepressant therapy. Discussed the risks/benefits and side effects of SSRI. -Start Zoloft  25mg  daily -Follow up in 1 month

## 2023-10-20 NOTE — Assessment & Plan Note (Signed)
A1c at last visit was 6.3, today 6.6. Patient has been taking metformin 1000mg  BID, but has not been taking semaglutide for the past month due to her depressed mood and family difficulties. Recommended continuing current medications. Patient stated she will be sure to take her semaglutide weekly.  -Continue metformin 1000mg  BID, semaglutide 0.25 weekly

## 2023-10-20 NOTE — Assessment & Plan Note (Signed)
Patient reports depressed mood, difficulty sleeping, poor concentration, and decreased appetite for the past month. She states her symptoms began with some family difficulties that started last month. Her PHQ9 score is 24. Denies SI/HI. She has never felt depressed, manic, or been on antidepressant therapy before. She will be starting meeting with a therapist next week, but also wishes to start on antidepressant therapy. Discussed the risks/benefits and side effects of SSRI. -Start Zoloft 25mg  daily -Follow up in 1 month

## 2023-10-21 NOTE — Progress Notes (Signed)
Internal Medicine Clinic Attending  I was physically present during the key portions of the resident provided service and participated in the medical decision making of patient's management care. I reviewed pertinent patient test results.  The assessment, diagnosis, and plan were formulated together and I agree with the documentation in the resident's note.  Reymundo Poll, MD

## 2023-10-28 ENCOUNTER — Encounter: Payer: Self-pay | Admitting: Audiology

## 2023-10-31 ENCOUNTER — Telehealth: Payer: Self-pay | Admitting: Otolaryngology

## 2023-10-31 NOTE — Telephone Encounter (Signed)
called and used interperter to help call 10-31-23, trying to r/s 11-16-23 apt provider in sx

## 2023-11-07 NOTE — Telephone Encounter (Signed)
Called and was able to reach the daughter and r/s to 11-21-23 Monday

## 2023-11-16 ENCOUNTER — Ambulatory Visit (INDEPENDENT_AMBULATORY_CARE_PROVIDER_SITE_OTHER): Payer: BLUE CROSS/BLUE SHIELD | Admitting: Otolaryngology

## 2023-11-21 ENCOUNTER — Ambulatory Visit (INDEPENDENT_AMBULATORY_CARE_PROVIDER_SITE_OTHER): Payer: Medicaid Other | Admitting: Otolaryngology

## 2024-01-30 ENCOUNTER — Ambulatory Visit: Payer: Medicaid Other | Admitting: Student

## 2024-01-30 VITALS — BP 162/106 | HR 84 | Temp 98.5°F | Ht 62.0 in | Wt 161.7 lb

## 2024-01-30 DIAGNOSIS — N390 Urinary tract infection, site not specified: Secondary | ICD-10-CM

## 2024-01-30 DIAGNOSIS — I10 Essential (primary) hypertension: Secondary | ICD-10-CM

## 2024-01-30 DIAGNOSIS — R519 Headache, unspecified: Secondary | ICD-10-CM

## 2024-01-30 DIAGNOSIS — R4589 Other symptoms and signs involving emotional state: Secondary | ICD-10-CM

## 2024-01-30 DIAGNOSIS — Z7984 Long term (current) use of oral hypoglycemic drugs: Secondary | ICD-10-CM

## 2024-01-30 DIAGNOSIS — E119 Type 2 diabetes mellitus without complications: Secondary | ICD-10-CM

## 2024-01-30 DIAGNOSIS — N3001 Acute cystitis with hematuria: Secondary | ICD-10-CM

## 2024-01-30 DIAGNOSIS — E785 Hyperlipidemia, unspecified: Secondary | ICD-10-CM

## 2024-01-30 DIAGNOSIS — R300 Dysuria: Secondary | ICD-10-CM

## 2024-01-30 DIAGNOSIS — F32A Depression, unspecified: Secondary | ICD-10-CM | POA: Diagnosis not present

## 2024-01-30 LAB — POCT URINALYSIS DIPSTICK
Bilirubin, UA: NEGATIVE
Glucose, UA: NEGATIVE
Ketones, UA: NEGATIVE
Nitrite, UA: NEGATIVE
Protein, UA: POSITIVE — AB
Spec Grav, UA: 1.02 (ref 1.010–1.025)
Urobilinogen, UA: 0.2 U/dL
pH, UA: 7 (ref 5.0–8.0)

## 2024-01-30 LAB — POCT GLYCOSYLATED HEMOGLOBIN (HGB A1C): Hemoglobin A1C: 6.5 % — AB (ref 4.0–5.6)

## 2024-01-30 LAB — GLUCOSE, CAPILLARY: Glucose-Capillary: 113 mg/dL — ABNORMAL HIGH (ref 70–99)

## 2024-01-30 MED ORDER — SPIRONOLACTONE 25 MG PO TABS: 25.0000 mg | ORAL_TABLET | Freq: Every day | ORAL | 3 refills | Status: DC

## 2024-01-30 MED ORDER — NITROFURANTOIN MONOHYD MACRO 100 MG PO CAPS: 100.0000 mg | ORAL_CAPSULE | Freq: Two times a day (BID) | ORAL | 0 refills | Status: AC

## 2024-01-30 MED ORDER — SERTRALINE HCL 25 MG PO TABS: 25.0000 mg | ORAL_TABLET | Freq: Every day | ORAL | 3 refills | Status: DC

## 2024-01-30 MED ORDER — ATORVASTATIN CALCIUM 40 MG PO TABS: 40.0000 mg | ORAL_TABLET | Freq: Every day | ORAL | 3 refills | Status: DC

## 2024-01-30 MED ORDER — SEMAGLUTIDE(0.25 OR 0.5MG/DOS) 2 MG/3ML ~~LOC~~ SOPN: 0.5000 mg | PEN_INJECTOR | SUBCUTANEOUS | 1 refills | Status: DC

## 2024-01-30 MED ORDER — AMLODIPINE-OLMESARTAN 10-40 MG PO TABS: 1.0000 | ORAL_TABLET | Freq: Every day | ORAL | 3 refills | Status: DC

## 2024-01-30 NOTE — Progress Notes (Signed)
CC: Follow-up  HPI:  [name] is a 58 y.o. female living with a history stated below and presents today for follow-up. Please see problem based assessment and plan for additional details.  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 Use up to four times daily as directed. (FOR ICD-10 E10.9, E11.9). 1 each 0   Blood Glucose Monitoring Suppl (FREESTYLE LITE) w/Device KIT 1 each by Does not apply route daily. 1 kit 1   carbamide peroxide (DEBROX) 6.5 % OTIC solution Place 5 drops into the right ear daily. 15 mL 0   cetirizine (ZYRTEC) 10 MG tablet Take 1 tablet (10 mg total) by mouth daily. 30 tablet 11   cyclobenzaprine (FLEXERIL) 5 MG tablet Take by mouth.     diclofenac Sodium (VOLTAREN) 1 % GEL APPLY 4 GRAMS TOPICALLY 4 TIMES DAILY 350 g 0   fluconazole (DIFLUCAN) 150 MG tablet Take 1 tablet (150 mg total) by mouth daily. 1 tablet 0   fluticasone (FLONASE) 50 MCG/ACT nasal spray Place 2 sprays into both nostrils 2 (two) times daily. 16 g 6   glucose blood (FREESTYLE LITE) test strip Use as instructed 100 each 12   Lancets (FREESTYLE) lancets Use as instructed 100 each 12   magnesium oxide (MAG-OX) 400 MG tablet Take by mouth.     pantoprazole (PROTONIX) 40 MG tablet Take 1 tablet (40 mg total) by mouth daily. 30 tablet 0   Polyethyl Glycol-Propyl Glycol (LUBRICANT EYE DROPS) 0.4-0.3 % SOLN Apply 1 drop to eye 2 (two) times daily as needed. 4 mL 3    potassium chloride (KLOR-CON) 10 MEQ tablet Take 1 tablet by mouth once daily 90 tablet 0   [DISCONTINUED] lisinopril-hydrochlorothiazide (PRINZIDE) 20-12.5 MG per tablet Take 2 tablets by mouth daily. 60 tablet 11   No current facility-administered medications on file prior to visit.    Family History  Problem Relation Age of Onset   Hypertension Mother    Hypertension Father    Colon cancer Neg Hx    Esophageal cancer Neg Hx    Stomach cancer Neg Hx    Rectal cancer Neg Hx     Social History   Socioeconomic History   Marital status: Married    Spouse name: Not on file   Number of children: Not on file   Years of education: Not on file   Highest education level: Not on file  Occupational History   Not on file  Tobacco Use   Smoking status: Never   Smokeless tobacco: Never  Vaping Use   Vaping status: Never Used  Substance and Sexual Activity   Alcohol use: No    Alcohol/week: 0.0 standard drinks of alcohol   Drug use: No   Sexual activity: Not on file  Other Topics Concern   Not on file  Social History Narrative   Not on file   Social Drivers of Health   Financial Resource Strain: Not on  file  Food Insecurity: Not on file  Transportation Needs: Not on file  Physical Activity: Not on file  Stress: Not on file  Social Connections: Not on file  Intimate Partner Violence: Not on file    Review of Systems: ROS negative except for what is noted on the assessment and plan.  Vitals:   01/30/24 1505 01/30/24 1610 01/30/24 1611 01/30/24 1612  BP: (!) 148/100 (!) 149/90 (!) 154/96 (!) 162/106  Pulse: 84 69 77 84  Temp:      TempSrc:      SpO2:      Weight:      Height:        Physical Exam: Constitutional: well-appearing, sitting in chair, in no acute distress Cardiovascular: regular rate and rhythm, no m/r/g Pulmonary/Chest: normal work of breathing on room air, lungs clear to auscultation bilaterally Abdominal: soft, non-tender, non-distended, no CVA  tenderness MSK: normal bulk and tone Skin: warm and dry Psych: normal mood and behavior  Assessment & Plan:     Patient discussed with Dr. Antony Contras  Urinary tract infection Endorses dysuria for the past week. Has a history of UTI's and was last treated with Keflex a few months ago. Symptoms resolved after Keflex. Afebrile. -UA + Culture -Macrobid 100 mg BID for 5 days  Type 2 diabetes mellitus (HCC) A1c stable at 6.5 today.  -Continue Metformin 1000 mg BID and Ozempic 0.5 mg/weekly  -Ophthalmology referral sent -UACR, spot protein, BMP ordered today  Headache Patient has a history of migraines, but for the past few months, endorses bilateral frontal, band-like headaches that are worse in the morning. This is occurring most mornings and has no increased in severity. No other exacerbating factors. States these are different that previous migraines and there is no associated aura. Tylenol has not helped. Endorses chronic blurred vision ("from my diabetes") that has not changed recently. Occasional nausea without vomiting. BP mildly elevated today but overall well-controlled. Orthostatics negative. -Given new onset, will order MRI before proceeding further -Consider LP/further work-up   Depression Prescribed Zoloft 25 mg/daily at last visit. Took for 2 weeks and stopped because it did not help. I explained today that it takes longer for this medication to work. Patient expressed understanding and will resume Zoloft. Denies SI/HI.  Essential hypertension BP mildly elevated but patient is stressed about UTI/HA's. BP normal and last visit and home readings are typically within goal. -Continue Azor 10-40 and Aldactone 25  -Re-assess at follow-up   Carmina Miller, D.O. [facility] Internal Medicine, PGY-1 Phone: [phone] Date 01/31/2024 Time 10:27 AM

## 2024-01-31 ENCOUNTER — Encounter: Payer: Medicaid Other | Admitting: Student

## 2024-01-31 DIAGNOSIS — F32A Depression, unspecified: Secondary | ICD-10-CM | POA: Insufficient documentation

## 2024-01-31 LAB — MICROALBUMIN / CREATININE URINE RATIO
Creatinine, Urine: 153.4 mg/dL
Microalb/Creat Ratio: 373 mg/g{creat} — ABNORMAL HIGH (ref 0–29)
Microalbumin, Urine: 572.5 ug/mL

## 2024-01-31 LAB — MICROSCOPIC EXAMINATION
Bacteria, UA: NONE SEEN
Casts: NONE SEEN /[LPF]
RBC, Urine: >30 /[HPF] — AB (ref 0–2)

## 2024-01-31 LAB — BASIC METABOLIC PANEL
BUN/Creatinine Ratio: 31 — ABNORMAL HIGH (ref 9–23)
BUN: 20 mg/dL (ref 6–24)
CO2: 25 mmol/L (ref 20–29)
Calcium: 9.7 mg/dL (ref 8.7–10.2)
Chloride: 97 mmol/L (ref 96–106)
Creatinine, Ser: 0.64 mg/dL (ref 0.57–1.00)
Glucose: 97 mg/dL (ref 70–99)
Potassium: 3.2 mmol/L — ABNORMAL LOW (ref 3.5–5.2)
Sodium: 140 mmol/L (ref 134–144)
eGFR: 104 mL/min/{1.73_m2} (ref >59)

## 2024-01-31 LAB — URINALYSIS, ROUTINE W REFLEX MICROSCOPIC
Bilirubin, UA: NEGATIVE
Glucose, UA: NEGATIVE
Ketones, UA: NEGATIVE
Nitrite, UA: NEGATIVE
Specific Gravity, UA: 1.02 (ref 1.005–1.030)
Urobilinogen, Ur: 0.2 mg/dL (ref 0.2–1.0)
pH, UA: 7 (ref 5.0–7.5)

## 2024-01-31 LAB — PROTEIN,TOTAL,URINE: Protein, Ur: 100 mg/dL

## 2024-01-31 MED ORDER — METFORMIN HCL 1000 MG PO TABS: 1000.0000 mg | ORAL_TABLET | Freq: Two times a day (BID) | ORAL | 3 refills | Status: AC

## 2024-01-31 NOTE — Assessment & Plan Note (Signed)
Endorses dysuria for the past week. Has a history of UTI's and was last treated with Keflex a few months ago. Symptoms resolved after Keflex. Afebrile. -UA + Culture -Macrobid 100 mg BID for 5 days

## 2024-01-31 NOTE — Assessment & Plan Note (Signed)
BP mildly elevated but patient is stressed about UTI/HA's. BP normal and last visit and home readings are typically within goal. -Continue Azor 10-40 and Aldactone 25  -Re-assess at follow-up

## 2024-01-31 NOTE — Assessment & Plan Note (Signed)
Prescribed Zoloft 25 mg/daily at last visit. Took for 2 weeks and stopped because it did not help. I explained today that it takes longer for this medication to work. Patient expressed understanding and will resume Zoloft. Denies SI/HI.

## 2024-01-31 NOTE — Assessment & Plan Note (Addendum)
Patient has a history of migraines, but for the past few months, endorses bilateral frontal, band-like headaches that are worse in the morning. This is occurring most mornings and has no increased in severity. No other exacerbating factors. States these are different that previous migraines and there is no associated aura. Tylenol has not helped. Endorses chronic blurred vision ("from my diabetes") that has not changed recently. Occasional nausea without vomiting. BP mildly elevated today but overall well-controlled. Orthostatics negative. -Given new onset, will order MRI before proceeding further -Consider LP/further work-up

## 2024-01-31 NOTE — Assessment & Plan Note (Addendum)
A1c stable at 6.5 today.  -Continue Metformin 1000 mg BID and Ozempic 0.5 mg/weekly  -Ophthalmology referral sent -UACR, spot protein, BMP ordered today

## 2024-02-01 ENCOUNTER — Other Ambulatory Visit: Payer: Self-pay | Admitting: Internal Medicine

## 2024-02-01 ENCOUNTER — Telehealth: Payer: Self-pay | Admitting: *Deleted

## 2024-02-01 DIAGNOSIS — Z1231 Encounter for screening mammogram for malignant neoplasm of breast: Secondary | ICD-10-CM

## 2024-02-01 DIAGNOSIS — N644 Mastodynia: Secondary | ICD-10-CM

## 2024-02-01 DIAGNOSIS — E119 Type 2 diabetes mellitus without complications: Secondary | ICD-10-CM

## 2024-02-01 LAB — URINE CULTURE: Organism ID, Bacteria: NO GROWTH

## 2024-02-01 NOTE — Telephone Encounter (Signed)
Spoke with daughter regarding mom mammogram appt. Per daughter her not having any breast problems. / breast center contacted Appt.February 28.2025 @ 4:40 pm / arrive 4:40 pm. LVM with appointment and information regarding the 75.00 no show fee, to call the breast center [phone] to cancel or reschedule. Appointment also mailed to the patient.

## 2024-02-02 NOTE — Progress Notes (Addendum)
Internal Medicine Clinic Attending  Case discussed with the resident at the time of the visit.  We reviewed the resident's history and exam and pertinent patient test results.  I agree with the assessment, diagnosis, and plan of care documented in the resident's note.   Patient with protein and RBCs on UA with a negative UCx; this is concerning for an intrinsic renal disease process like glomerulonephritis. Needs short term follow up with repeat UA, BMP to ensure renal function is not declining, and urine protein to creatinine ratio to quantify proteinuria.

## 2024-02-13 ENCOUNTER — Encounter: Payer: Medicaid Other | Admitting: Student

## 2024-02-17 ENCOUNTER — Ambulatory Visit
Admission: RE | Admit: 2024-02-17 | Discharge: 2024-02-17 | Disposition: A | Discharge status: Home / Self Care | Payer: Medicaid Other | Source: Ambulatory Visit | Attending: Student in an Organized Health Care Education/Training Program | Admitting: Student in an Organized Health Care Education/Training Program

## 2024-02-17 DIAGNOSIS — Z1231 Encounter for screening mammogram for malignant neoplasm of breast: Secondary | ICD-10-CM

## 2024-02-17 DIAGNOSIS — N644 Mastodynia: Secondary | ICD-10-CM

## 2024-02-22 ENCOUNTER — Ambulatory Visit: Admitting: Student

## 2024-02-22 VITALS — BP 133/91 | HR 87 | Temp 97.6°F | Ht 62.0 in | Wt 158.7 lb

## 2024-02-22 DIAGNOSIS — R12 Heartburn: Secondary | ICD-10-CM

## 2024-02-22 DIAGNOSIS — N3001 Acute cystitis with hematuria: Secondary | ICD-10-CM | POA: Diagnosis not present

## 2024-02-22 DIAGNOSIS — N390 Urinary tract infection, site not specified: Secondary | ICD-10-CM | POA: Diagnosis not present

## 2024-02-22 MED ORDER — PANTOPRAZOLE SODIUM 40 MG PO TBEC
40.0000 mg | DELAYED_RELEASE_TABLET | Freq: Every day | ORAL | 3 refills | Status: DC
Start: 2024-02-22 — End: 2024-08-09

## 2024-02-22 NOTE — Progress Notes (Unsigned)
 CC: Follow-up  HPI:  [name] is a 58 y.o. female living with a history stated below and presents today for follow-up. Please see problem based assessment and plan for additional details.  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 90 tablet 3   atorvastatin (LIPITOR) 40 MG tablet Take 1 tablet (40 mg total) by mouth daily. 90 tablet 3   blood glucose meter kit and supplies Dispense based on patient and insurance preference. Use up to four times daily as directed. (FOR ICD-10 E10.9, E11.9). 1 each 0   Blood Glucose Monitoring Suppl (FREESTYLE LITE) w/Device KIT 1 each by Does not apply route daily. 1 kit 1   carbamide peroxide (DEBROX) 6.5 % OTIC solution Place 5 drops into the right ear daily. 15 mL 0   cetirizine (ZYRTEC) 10 MG tablet Take 1 tablet (10 mg total) by mouth daily. 30 tablet 11   cyclobenzaprine (FLEXERIL) 5 MG tablet Take by mouth.     diclofenac Sodium (VOLTAREN) 1 % GEL APPLY 4 GRAMS TOPICALLY 4 TIMES DAILY 350 g 0   fluconazole (DIFLUCAN) 150 MG tablet Take 1 tablet (150 mg total) by mouth daily. 1 tablet 0   fluticasone (FLONASE) 50 MCG/ACT nasal spray Place 2 sprays into both nostrils 2 (two) times daily. 16 g 6   glucose blood (FREESTYLE LITE) test strip Use as instructed 100 each 12   Lancets (FREESTYLE) lancets Use as instructed 100 each 12   magnesium oxide (MAG-OX) 400 MG tablet Take by mouth.     metFORMIN (GLUCOPHAGE) 1000 MG tablet  Take 1 tablet (1,000 mg total) by mouth 2 (two) times daily with a meal. 180 tablet 3   pantoprazole (PROTONIX) 40 MG tablet Take 1 tablet (40 mg total) by mouth daily. 30 tablet 0   Polyethyl Glycol-Propyl Glycol (LUBRICANT EYE DROPS) 0.4-0.3 % SOLN Apply 1 drop to eye 2 (two) times daily as needed. 4 mL 3   potassium chloride (KLOR-CON) 10 MEQ tablet Take 1 tablet by mouth once daily 90 tablet 0   Semaglutide,0.25 or 0.5MG /DOS, 2 MG/3ML SOPN Inject 0.5 mg into the skin once a week. 9 mL 1   sertraline (ZOLOFT) 25 MG tablet Take 1 tablet (25 mg total) by mouth daily. 90 tablet 3   spironolactone (ALDACTONE) 25 MG tablet Take 1 tablet (25 mg total) by mouth daily. 90 tablet 3   [DISCONTINUED] lisinopril-hydrochlorothiazide (PRINZIDE) 20-12.5 MG per tablet Take 2 tablets by mouth daily. 60 tablet 11   No current facility-administered medications on file prior to visit.    Family History  Problem Relation Age of Onset   Hypertension Mother    Hypertension Father    Colon cancer Neg Hx    Esophageal cancer Neg Hx    Stomach cancer Neg Hx    Rectal cancer Neg Hx     Social History   Socioeconomic History   Marital status: Married    Spouse name: Not  on file   Number of children: Not on file   Years of education: Not on file   Highest education level: Not on file  Occupational History   Not on file  Tobacco Use   Smoking status: Never   Smokeless tobacco: Never  Vaping Use   Vaping status: Never Used  Substance and Sexual Activity   Alcohol use: No    Alcohol/week: 0.0 standard drinks of alcohol   Drug use: No   Sexual activity: Not on file  Other Topics Concern   Not on file  Social History Narrative   Not on file   Social Drivers of Health   Financial Resource Strain: Not on file  Food Insecurity: Not on file  Transportation Needs: Not on file  Physical Activity: Not on file  Stress: Not on file  Social Connections: Not on file  Intimate Partner Violence: Not on  file    Review of Systems: ROS negative except for what is noted on the assessment and plan.  Vitals:   02/22/24 1059  BP: (!) 133/91  Pulse: 87  Temp: 97.6 F (36.4 C)  TempSrc: Oral  SpO2: 99%  Weight: 158 lb 11.2 oz (72 kg)  Height: 5\' 2"  (1.575 m)    Physical Exam: Constitutional: well-appearing, sitting in chair, in no acute distress Cardiovascular: regular rate and rhythm, no m/r/g Pulmonary/Chest: normal work of breathing on room air, lungs clear to auscultation bilaterally Abdominal: soft, non-tender, non-distended MSK: normal bulk and tone Skin: warm and dry Psych: normal mood and behavior  Assessment & Plan:     Patient discussed with Dr. {[id number]::"[id number]","G. Hoffman","Mullen","Narendra","Vincent","Guilloud","Lau","Machen"}  No problem-specific Assessment & Plan notes found for this encounter.   Ashley Huynh, D.O. [facility] Internal Medicine, PGY-1 Phone: [phone] Date 02/22/2024 Time 11:04 AM

## 2024-02-23 ENCOUNTER — Other Ambulatory Visit

## 2024-02-23 ENCOUNTER — Encounter: Payer: Self-pay | Admitting: Internal Medicine

## 2024-02-23 DIAGNOSIS — N3001 Acute cystitis with hematuria: Secondary | ICD-10-CM | POA: Diagnosis not present

## 2024-02-23 LAB — BASIC METABOLIC PANEL
BUN/Creatinine Ratio: 23 (ref 9–23)
BUN: 22 mg/dL (ref 6–24)
CO2: 24 mmol/L (ref 20–29)
Calcium: 10.5 mg/dL — ABNORMAL HIGH (ref 8.7–10.2)
Chloride: 99 mmol/L (ref 96–106)
Creatinine, Ser: 0.94 mg/dL (ref 0.57–1.00)
Glucose: 105 mg/dL — ABNORMAL HIGH (ref 70–99)
Potassium: 4.6 mmol/L (ref 3.5–5.2)
Sodium: 138 mmol/L (ref 134–144)
eGFR: 71 mL/min/{1.73_m2} (ref >59)

## 2024-02-23 NOTE — Progress Notes (Signed)
Letter sent about normal results

## 2024-02-23 NOTE — Assessment & Plan Note (Addendum)
 Patient continues to endorse dysuria, urgency, and hesitancy. She endorses chills for the past two days but denies fevers. She has a history of recurrent UTI's and was recently treated with Keflex and Macrobid. Symptoms improved but returned last week. UA last month showed sterile pyruria, hematuria, and proteinuria.  Review of prior UAs from the past few years shows a similar pattern.  At the time of most recent UA, it was difficult to determine whether findings were secondary to UTI vs. intrinsic renal pathology. -Repeat UA with reflex -Protein to Creatinine ratio -BMP -Consider alternative cause and/or Urology/Nephrology referral

## 2024-02-23 NOTE — Progress Notes (Signed)
 Internal Medicine Clinic Attending  Case discussed with the resident at the time of the visit.  We reviewed the resident's history and exam and pertinent patient test results.  I agree with the assessment, diagnosis, and plan of care documented in the resident's note.

## 2024-02-23 NOTE — Assessment & Plan Note (Signed)
 Endorses recurrence of acid reflux symptoms. She was given a month supply of Pantoprazole 07/2023 which resolved symptoms but they recently returned. -Ordered pantoprazole 40 mg daily for 8 weeks with plan to taper if symptoms resolve

## 2024-02-26 LAB — URINE CULTURE, COMPREHENSIVE

## 2024-02-26 LAB — UA/M W/RFLX CULTURE, COMP
Bilirubin, UA: NEGATIVE
Glucose, UA: NEGATIVE
Ketones, UA: NEGATIVE
Nitrite, UA: NEGATIVE
Specific Gravity, UA: 1.017 (ref 1.005–1.030)
Urobilinogen, Ur: 0.2 mg/dL (ref 0.2–1.0)
pH, UA: 5.5 (ref 5.0–7.5)

## 2024-02-26 LAB — PROTEIN / CREATININE RATIO, URINE
Creatinine, Urine: 124.8 mg/dL
Protein, Ur: 21.2 mg/dL
Protein/Creat Ratio: 170 mg/g{creat} (ref 0–200)

## 2024-02-26 LAB — MICROSCOPIC EXAMINATION
Bacteria, UA: NONE SEEN
Casts: NONE SEEN /LPF

## 2024-03-05 ENCOUNTER — Other Ambulatory Visit: Payer: Self-pay | Admitting: Student

## 2024-03-05 DIAGNOSIS — N3001 Acute cystitis with hematuria: Secondary | ICD-10-CM

## 2024-03-05 DIAGNOSIS — R300 Dysuria: Secondary | ICD-10-CM

## 2024-03-05 DIAGNOSIS — R319 Hematuria, unspecified: Secondary | ICD-10-CM

## 2024-03-21 ENCOUNTER — Encounter: Payer: Self-pay | Admitting: Internal Medicine

## 2024-03-22 ENCOUNTER — Encounter: Payer: Self-pay | Admitting: Internal Medicine

## 2024-03-24 ENCOUNTER — Encounter: Payer: Self-pay | Admitting: Internal Medicine

## 2024-03-26 ENCOUNTER — Telehealth: Payer: Self-pay | Admitting: Student

## 2024-03-26 NOTE — Telephone Encounter (Signed)
 Prior Authorization is in process.  Copied from CRM [id number]. Topic: General - Other >> Mar 26, 2024 12:06 PM Carrielelia G wrote: Reason for CRM: Kendal Hymen with [employer] calling in regards to a scheduled CT 4/9/2-25. The authorization is needed before April 8 10am.  Or it will be cancelled. Please advise.

## 2024-03-26 NOTE — Telephone Encounter (Signed)
 Pt has been Authorized  Copied from [employer] [id number]. Topic: General - Other >> Mar 26, 2024 12:06 PM Carrielelia G wrote: Reason for CRM: Kendal Hymen with [employer] calling in regards to a scheduled CT 4/9/2-25. The authorization is needed before April 8 10am.  Or it will be cancelled. Please advise.

## 2024-03-28 ENCOUNTER — Ambulatory Visit
Admission: RE | Admit: 2024-03-28 | Discharge: 2024-03-28 | Disposition: A | Discharge status: Home / Self Care | Source: Ambulatory Visit | Attending: Internal Medicine

## 2024-03-28 DIAGNOSIS — R300 Dysuria: Secondary | ICD-10-CM

## 2024-03-28 DIAGNOSIS — R319 Hematuria, unspecified: Secondary | ICD-10-CM

## 2024-03-28 MED ORDER — IOPAMIDOL (ISOVUE-370) INJECTION 76%
100.0000 mL | Freq: Once | INTRAVENOUS | Status: AC | PRN
  Administered 2024-03-28: 100 mL via INTRAVENOUS

## 2024-04-02 ENCOUNTER — Ambulatory Visit: Admitting: Student

## 2024-04-02 ENCOUNTER — Other Ambulatory Visit (HOSPITAL_COMMUNITY)
Admission: RE | Admit: 2024-04-02 | Discharge: 2024-04-02 | Disposition: A | Discharge status: Home / Self Care | Source: Ambulatory Visit | Attending: Internal Medicine | Admitting: Internal Medicine

## 2024-04-02 ENCOUNTER — Ambulatory Visit: Payer: Self-pay | Admitting: Student

## 2024-04-02 VITALS — BP 154/94 | HR 93 | Temp 98.2°F | Wt 158.2 lb

## 2024-04-02 DIAGNOSIS — R300 Dysuria: Secondary | ICD-10-CM | POA: Insufficient documentation

## 2024-04-02 NOTE — Patient Instructions (Signed)
 Thank you, Ms.Makahla JOLANE BANKHEAD for allowing us  to provide your care today. Today we discussed:  - I will call you about your results tomorrow   I have ordered the following labs for you:  Lab Orders         Urinalysis, Reflex Microscopic      Tests ordered today:  As above  Referrals ordered today:   Referral Orders  No referral(s) requested today     I have ordered the following medication/changed the following medications:   Stop the following medications: There are no discontinued medications.   Start the following medications: No orders of the defined types were placed in this encounter.    Follow up:  AS needed     Remember:   Should you have any questions or concerns please call the internal medicine clinic at [phone].     Lanney Pitts, DO [facility]

## 2024-04-02 NOTE — Progress Notes (Signed)
 Established Patient Office Visit  Subjective   Patient ID: Ashley Huynh, female    DOB: [birth year]  Age: 58 y.o. MRN: [id number]  Chief Complaint  Patient presents with   vaginal problem    HPI  This is a 58 year old female living with a history stated below and presents today for dysuria. Please see problem based assessment and plan for additional details.   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   Diabetic foot ulcer associated with type 2 diabetes mellitus (HCC) 01/26/2021   Essential tremor 04/24/2013   Hyperlipidemia    Hypertension    Hypokalemia    Migraine 12/31/2013   T2DM (type 2 diabetes mellitus) (HCC) 09/13/2017    ROS   As per assessment and plan Objective:     BP (!) 154/94 (BP Location: Right Arm, Patient Position: Sitting, Cuff Size: Normal)   Pulse 93   Temp 98.2 F (36.8 C) (Oral)   Wt 158 lb 3.2 oz (71.8 kg)   LMP 05/23/2019 Comment: abnormal  SpO2 99%   BMI 28.94 kg/m  BP Readings from Last 3 Encounters:  04/02/24 (!) 154/94  02/22/24 (!) 133/91  01/30/24 (!) 162/106   Wt Readings from Last 3 Encounters:  04/02/24 158 lb 3.2 oz (71.8 kg)  02/22/24 158 lb 11.2 oz (72 kg)  01/30/24 161 lb 11.2 oz (73.3 kg)      Physical Exam  General: Sitting in chair, no acute distress Cardiovascular: Regular rate, no murmurs appreciated Pulmonary: Breathing comfortably, no wheezing or crackles Abdomen: +suprapubic tenderness  MSK: + bilateral CVA tenderness GU: Chaperone present, no masses or lesions noted on labia majora/minora. Atrophic. No notable discharge noted.   No results found for any visits on 04/02/24.  Last CBC Lab Results  Component Value Date   WBC 7.1 03/10/2022   HGB 14.7 03/10/2022   HCT 44.1 03/10/2022   MCV 85 03/10/2022   MCH 28.2 03/10/2022   RDW 13.5 03/10/2022   PLT 331 03/10/2022   Last metabolic panel Lab Results  Component Value Date   GLUCOSE 105  (H) 02/22/2024   NA 138 02/22/2024   K 4.6 02/22/2024   CL 99 02/22/2024   CO2 24 02/22/2024   BUN 22 02/22/2024   CREATININE 0.94 02/22/2024   EGFR 71 02/22/2024   CALCIUM 10.5 (H) 02/22/2024   PROT 7.4 03/10/2022   ALBUMIN 4.5 03/10/2022   LABGLOB 2.9 03/10/2022   AGRATIO 1.6 03/10/2022   BILITOT <0.2 03/10/2022   ALKPHOS 81 03/10/2022   AST 14 03/10/2022   ALT 11 03/10/2022   ANIONGAP 10 03/10/2017   Last hemoglobin A1c Lab Results  Component Value Date   HGBA1C 6.5 (A) 01/30/2024      The 10-year ASCVD risk score (Arnett DK, et al., 2019) is: 18.4%    Assessment & Plan:  Patient is discussed with Dr Frances Furbish   Problem List Items Addressed This Visit       Other   Dysuria - Primary   This is a pleasant 58 year old female who presents today with chronic history of dysuria.  Patient reports for the past 3 months, she has been diagnosed with multiple UTIs with treatments that initially helped however her symptoms returned afterwards.  Patient was last seen on 02/23/2024 for similar presentation.  She had a UA that showed 3+ leukocytes with culture growing mixed urogenital flora consistent with previous UA results.  She was treated with  Macrobid 100 mg twice daily for 5 days on 01/30/2024.  She also had a CT for hematuria on March 28, 2024; results are pending.  Patient reports symptoms of burning with urination, increased urine frequency and urgency, bilateral flank tenderness left worse than right, radiating down her groin bilaterally, headaches, nausea, chills, low appetite.  Patient denies any hematic urea.  Reports intermittent vaginal discharge, whitish color.  Reports that she is not currently sexually active, reports that her husband is out of country.  She does has a history of diabetes, for which she takes metformin 1000 mg twice daily and Ozempic.  Differentials include but not limited to uncomplicated worsened complicated cystitis, pyelonephritis, nephrolithiasis, BV,  candidiasis.  I called radiology to read the pending CT hematuria.  -Follow-up on urinalysis with reflex -Follow-up on cervicovaginal ancillary test -Follow-up on CT hematuria -Treatment/management depends on the results from above      Relevant Orders   Urinalysis, Reflex Microscopic   Cervicovaginal ancillary only    Return in about 3 months (around 07/02/2024) for As needed .    Lanney Pitts, DO

## 2024-04-02 NOTE — Telephone Encounter (Signed)
 Pt has an appt today @ 1315 with Dr Hartwell Linea.

## 2024-04-02 NOTE — Telephone Encounter (Signed)
 Chief Complaint: Dysuria x3 months  Symptoms: Left-sided lower stomach pain (9/10 intermittent) x2 days, "sharp pain" on left lower back x2 days (9/10 intermittent), chills, abnormal vaginal discharge (odor, causes itching), urinary urgency, nausea Pertinent Negatives: Patient denies fever, blood in urine  Disposition:  [x] Appointment(In office)  Additional Notes: Spoke with pt's daughter, Ashley Huynh. Pt was seen on 3/5 by Dr. Herminio Lopes for acute cystitis. Pt's pain has gotten worse. Pt was referred to a kidney specialist but pt has not heard back for the appointment date. This RN recommends pt is seen within next 4 hours. Pt requests seeing Dr. Herminio Lopes. This RN contacted [facility] for help with scheduling. The clinical team states Dr. Herminio Lopes is not in today but requests pt be transferred for help with scheduling. This RN warm transferred pt to [facility] for scheduling.     Copied from CRM [id number]. Topic: Clinical - Red Word Triage >> Apr 02, 2024  9:24 AM Ashley Huynh wrote: Red Word that prompted transfer to Nurse Triage: daughter Ashley Huynh on the line calling on behalf of mother, who is experiencing, burning with urinating and pain, Just recently had an UTI. So she has been going through this for about a month. Pain now is a 9 .. Reason for Disposition  [1] SEVERE pain with urination (e.g., excruciating) AND [2] not improved after 2 hours of pain medicine and Sitz bath  Answer Assessment - Initial Assessment Questions SEVERITY: "How bad is the pain?"  (e.g., Scale 1-10; mild, moderate, or severe)      8/10 with urination PATTERN: "Is pain present every time you urinate or just sometimes?"      Intermittent, pain even without urination ONSET: "When did the painful urination start?"      3 months FEVER: "Do you have a fever?" If Yes, ask: "What is your temperature, how was it measured, and when did it start?"     Denies, does get chills PAST UTI: "Have you had a urine infection before?" If Yes, ask: "When was  the last time?" and "What happened that time?"      Yes, 3/5 CAUSE: "What do you think is causing the painful urination?"  (e.g., UTI, scratch, Herpes sore)     "I don't know, I think it could be something with the kidneys" OTHER SYMPTOMS: "Do you have any other symptoms?" (e.g., blood in urine, flank pain, genital sores, urgency, vaginal discharge)     Abnormal vaginal discharge  Protocols used: Urination Pain - Female-A-AH

## 2024-04-02 NOTE — Assessment & Plan Note (Signed)
 This is a pleasant 58 year old female who presents today with chronic history of dysuria.  Patient reports for the past 3 months, she has been diagnosed with multiple UTIs with treatments that initially helped however her symptoms returned afterwards.  Patient was last seen on 02/23/2024 for similar presentation.  She had a UA that showed 3+ leukocytes with culture growing mixed urogenital flora consistent with previous UA results.  She was treated with Macrobid 100 mg twice daily for 5 days on 01/30/2024.  She also had a CT for hematuria on March 28, 2024; results are pending.  Patient reports symptoms of burning with urination, increased urine frequency and urgency, bilateral flank tenderness left worse than right, radiating down her groin bilaterally, headaches, nausea, chills, low appetite.  Patient denies any hematic urea.  Reports intermittent vaginal discharge, whitish color.  Reports that she is not currently sexually active, reports that her husband is out of country.  She does has a history of diabetes, for which she takes metformin 1000 mg twice daily and Ozempic.  Differentials include but not limited to uncomplicated worsened complicated cystitis, pyelonephritis, nephrolithiasis, BV, candidiasis.  I called radiology to read the pending CT hematuria.  -Follow-up on urinalysis with reflex -Follow-up on cervicovaginal ancillary test -Follow-up on CT hematuria -Treatment/management depends on the results from above

## 2024-04-03 LAB — URINALYSIS, ROUTINE W REFLEX MICROSCOPIC
Bilirubin, UA: NEGATIVE
Glucose, UA: NEGATIVE
Ketones, UA: NEGATIVE
Nitrite, UA: NEGATIVE
Specific Gravity, UA: 1.013 (ref 1.005–1.030)
Urobilinogen, Ur: 0.2 mg/dL (ref 0.2–1.0)
pH, UA: 7 (ref 5.0–7.5)

## 2024-04-03 LAB — MICROSCOPIC EXAMINATION
Bacteria, UA: NONE SEEN
Casts: NONE SEEN /LPF

## 2024-04-03 NOTE — Progress Notes (Signed)
 Internal Medicine Clinic Attending  Case discussed with the resident at the time of the visit.  We reviewed the resident's history and exam and pertinent patient test results.  I agree with the assessment, diagnosis, and plan of care documented in the resident's note.

## 2024-04-04 LAB — CERVICOVAGINAL ANCILLARY ONLY
Bacterial Vaginitis (gardnerella): NEGATIVE
Candida Glabrata: NEGATIVE
Candida Vaginitis: NEGATIVE
Chlamydia: NEGATIVE
Comment: NEGATIVE
Comment: NEGATIVE
Comment: NEGATIVE
Comment: NEGATIVE
Comment: NEGATIVE
Comment: NORMAL
Neisseria Gonorrhea: NEGATIVE
Trichomonas: NEGATIVE

## 2024-04-05 ENCOUNTER — Other Ambulatory Visit: Payer: Self-pay | Admitting: Student

## 2024-04-05 DIAGNOSIS — R300 Dysuria: Secondary | ICD-10-CM

## 2024-04-19 ENCOUNTER — Encounter: Payer: Self-pay | Admitting: Student

## 2024-04-19 ENCOUNTER — Other Ambulatory Visit: Payer: Self-pay

## 2024-04-19 ENCOUNTER — Ambulatory Visit (INDEPENDENT_AMBULATORY_CARE_PROVIDER_SITE_OTHER): Admitting: Student

## 2024-04-19 VITALS — BP 161/85 | HR 70 | Temp 98.0°F | Ht 62.0 in | Wt 158.0 lb

## 2024-04-19 DIAGNOSIS — R519 Headache, unspecified: Secondary | ICD-10-CM | POA: Diagnosis not present

## 2024-04-19 DIAGNOSIS — F3289 Other specified depressive episodes: Secondary | ICD-10-CM

## 2024-04-19 DIAGNOSIS — Z7984 Long term (current) use of oral hypoglycemic drugs: Secondary | ICD-10-CM

## 2024-04-19 DIAGNOSIS — F32A Depression, unspecified: Secondary | ICD-10-CM | POA: Diagnosis not present

## 2024-04-19 DIAGNOSIS — E119 Type 2 diabetes mellitus without complications: Secondary | ICD-10-CM

## 2024-04-19 DIAGNOSIS — K760 Fatty (change of) liver, not elsewhere classified: Secondary | ICD-10-CM | POA: Diagnosis not present

## 2024-04-19 DIAGNOSIS — Z7985 Long-term (current) use of injectable non-insulin antidiabetic drugs: Secondary | ICD-10-CM | POA: Diagnosis not present

## 2024-04-19 DIAGNOSIS — I10 Essential (primary) hypertension: Secondary | ICD-10-CM | POA: Diagnosis not present

## 2024-04-19 LAB — PROTIME-INR
INR: 1 (ref 0.8–1.2)
Prothrombin Time: 13.6 s (ref 11.4–15.2)

## 2024-04-19 MED ORDER — SEMAGLUTIDE(0.25 OR 0.5MG/DOS) 2 MG/3ML ~~LOC~~ SOPN
1.0000 mg | PEN_INJECTOR | SUBCUTANEOUS | 1 refills | Status: DC
Start: 2024-04-19 — End: 2024-04-19

## 2024-04-19 MED ORDER — FLUTICASONE PROPIONATE 50 MCG/ACT NA SUSP: 2.0000 | Freq: Two times a day (BID) | NASAL | 3 refills | Status: DC

## 2024-04-19 MED ORDER — SEMAGLUTIDE (1 MG/DOSE) 4 MG/3ML ~~LOC~~ SOPN: 1.0000 mg | PEN_INJECTOR | SUBCUTANEOUS | 3 refills | Status: DC

## 2024-04-19 MED ORDER — ESCITALOPRAM OXALATE 10 MG PO TABS: 10.0000 mg | ORAL_TABLET | Freq: Every day | ORAL | 3 refills | Status: DC

## 2024-04-19 MED ORDER — AMLODIPINE-OLMESARTAN 10-40 MG PO TABS: 1.0000 | ORAL_TABLET | Freq: Every day | ORAL | 3 refills | Status: DC

## 2024-04-19 NOTE — Assessment & Plan Note (Addendum)
 Last A1c 6.5 on 01/30/2024 at goal. OP medication are Metformin  1000 mg BID and Ozempic  0.5 mg/weekly. She reports that nausea and vomiting has improved. Weight 158 today, 161 pounds in 01/30/2024. Reports that she watches her diet and walks daily.  - Increase Ozempic  1 mg once a week - Continue Metformin  1000 mg BID  - Ophthamo contact is provided in the AVS

## 2024-04-19 NOTE — Assessment & Plan Note (Signed)
 BP Readings from Last 3 Encounters:  04/19/24 (!) 161/85  04/02/24 (!) 154/94  02/22/24 (!) 133/91   OP medication regimen are Azor  10-40 mg daily and Aldactone  25. Reports that her headaches has improved, no CP or SOB. She does not check her BP in the house, not blood pressure cough. Reports that she did not take her medication this morning. - Asked to keep a BP log and get a blood pressure cuff daily, one hour after medications  - RTC in 2 weeks for RN visit and titrate medication

## 2024-04-19 NOTE — Assessment & Plan Note (Signed)
 Noted on CT findings. Denies any alcohol use. No abdominal pain. Will work up with: - CMP, PT/INR, Lipid panel, CBC  - Acute hepatitis panel

## 2024-04-19 NOTE — Assessment & Plan Note (Signed)
 Per the last visit, MRI brain was ordered to further evaluate. She presently reports that her symptoms have improved, she is not concerned about headaches

## 2024-04-19 NOTE — Progress Notes (Signed)
 Established Patient Office Visit  Subjective   Patient ID: Ashley Huynh, female    DOB: 05-Mar-1966  Age: 58 y.o. MRN: [id number]  Chief Complaint  Patient presents with   Follow-up    Patient states follow up on her kidneys / medication refill    HPI This is a 58 year old female living with a history stated below and presents today for follow up on DM, HTN. Please see problem based assessment and plan for additional details.    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   Diabetic foot ulcer associated with type 2 diabetes mellitus (HCC) 01/26/2021   Essential tremor 04/24/2013   Hyperlipidemia    Hypertension    Hypokalemia    Migraine 12/31/2013   T2DM (type 2 diabetes mellitus) (HCC) 09/13/2017    ROS   As per assessment and plan  Objective:     BP (!) 161/85 (BP Location: Right Arm, Patient Position: Sitting, Cuff Size: Normal)   Pulse 70   Temp 98 F (36.7 C) (Oral)   Ht 5\' 2"  (1.575 m)   Wt 158 lb (71.7 kg)   LMP 05/23/2019 Comment: abnormal  SpO2 100%   BMI 28.90 kg/m  BP Readings from Last 3 Encounters:  04/19/24 (!) 161/85  04/02/24 (!) 154/94  02/22/24 (!) 133/91   Wt Readings from Last 3 Encounters:  04/19/24 158 lb (71.7 kg)  04/02/24 158 lb 3.2 oz (71.8 kg)  02/22/24 158 lb 11.2 oz (72 kg)   SpO2 Readings from Last 3 Encounters:  04/19/24 100%  04/02/24 99%  02/22/24 99%      Physical Exam  General: Sitting in chair, no acute distress Cardiovascular: Regular rate, no murmurs appreciated Pulmonary: Breathing comfortably, no wheezing or crackles Abdomen: Soft, nontender, nondistended, bowel sounds present MSK: Range of motion intact, no lower extremity edema  Last CBC Lab Results  Component Value Date   WBC 7.1 03/10/2022   HGB 14.7 03/10/2022   HCT 44.1 03/10/2022   MCV 85 03/10/2022   MCH 28.2 03/10/2022   RDW 13.5 03/10/2022   PLT 331 03/10/2022   Last metabolic  panel Lab Results  Component Value Date   GLUCOSE 105 (H) 02/22/2024   NA 138 02/22/2024   K 4.6 02/22/2024   CL 99 02/22/2024   CO2 24 02/22/2024   BUN 22 02/22/2024   CREATININE 0.94 02/22/2024   EGFR 71 02/22/2024   CALCIUM  10.5 (H) 02/22/2024   PROT 7.4 03/10/2022   ALBUMIN 4.5 03/10/2022   LABGLOB 2.9 03/10/2022   AGRATIO 1.6 03/10/2022   BILITOT <0.2 03/10/2022   ALKPHOS 81 03/10/2022   AST 14 03/10/2022   ALT 11 03/10/2022   ANIONGAP 10 03/10/2017   Last lipids Lab Results  Component Value Date   CHOL 130 09/08/2022   HDL 43 09/08/2022   LDLCALC 70 09/08/2022   TRIG 91 09/08/2022   CHOLHDL 3.0 09/08/2022   Last hemoglobin A1c Lab Results  Component Value Date   HGBA1C 6.5 (A) 01/30/2024     The 10-year ASCVD risk score (Arnett DK, et al., 2019) is: 21%    Assessment & Plan:  Patient is discussed with Dr Ancil Balzarine   Problem List Items Addressed This Visit       Cardiovascular and Mediastinum   Essential hypertension (Chronic)   BP Readings from Last 3 Encounters:  04/19/24 (!) 161/85  04/02/24 (!) 154/94  02/22/24 (!) 133/91  OP medication regimen are Azor  10-40 mg daily and Aldactone  25. Reports that her headaches has improved, no CP or SOB. She does not check her BP in the house, not blood pressure cough. Reports that she did not take her medication this morning. - Asked to keep a BP log and get a blood pressure cuff daily, one hour after medications  - RTC in 2 weeks for RN visit and titrate medication       Relevant Medications   amLODipine -olmesartan  (AZOR ) 10-40 MG tablet     Digestive   Nonalcoholic fatty liver disease   Noted on CT findings. Denies any alcohol use. No abdominal pain. Will work up with: - CMP, PT/INR, Lipid panel, CBC  - Acute hepatitis panel         Endocrine   Type 2 diabetes mellitus (HCC) (Chronic)   Last A1c 6.5 on 01/30/2024 at goal. OP medication are Metformin  1000 mg BID and Ozempic  0.5 mg/weekly. She reports that  nausea and vomiting has improved. Weight 158 today, 161 pounds in 01/30/2024. Reports that she watches her diet and walks daily.  - Increase Ozempic  1 mg once a week - Continue Metformin  1000 mg BID  - Ophthamo contact is provided in the AVS       Relevant Medications   amLODipine -olmesartan  (AZOR ) 10-40 MG tablet   Semaglutide , 1 MG/DOSE, 4 MG/3ML SOPN     Other   Headache   Per the last visit, MRI brain was ordered to further evaluate. She presently reports that her symptoms have improved, she is not concerned about headaches       Relevant Medications   escitalopram  (LEXAPRO ) 10 MG tablet   Depression   She is prescribed Zoloft  25 mg daily, however she is not taking this due to worsening of nausea and irritability. States that she has stopped taking this medication, reports fatigue and irritability as the primary symptoms.  - STOP Zoloft  - Start Lexipro 10 mg daily       Relevant Medications   escitalopram  (LEXAPRO ) 10 MG tablet   Other Visit Diagnoses       Fatty liver disease, nonalcoholic    -  Primary   Relevant Orders   CMP14 + Anion Gap   Protime-INR   Lipid Profile   CBC no Diff   Acute Viral Hepatitis (HAV, HBV, HCV)       Return for 2 weeks RN blood pressure, and 3 months HTN and DM .    Lanney Pitts, DO

## 2024-04-19 NOTE — Assessment & Plan Note (Signed)
 She is prescribed Zoloft  25 mg daily, however she is not taking this due to worsening of nausea and irritability. States that she has stopped taking this medication, reports fatigue and irritability as the primary symptoms.  - STOP Zoloft  - Start Lexipro 10 mg daily

## 2024-04-19 NOTE — Patient Instructions (Addendum)
 Thank you, [name] for allowing us  to provide your care today. Today we discussed:  For your Diabetes - Continue taking metformin  1000 mg two times a day  - Ozempic  1 mg once a week   For your high blood pressure - Continue taking Azor  one tablet daily and aldactone  25 mg, one tablet daily  - Check your blood pressure daily in the morning, write it on a piece of paper   For your eye doctor, please call   Eye care center in [place], [place]  Located in: [facility] Address: [address] [place], [place], [place], [place] [address] Phone: [phone]  For your depression - STOP Zoloft  - START Lexipro, one tablet daily   I have ordered the following labs for you:  Lab Orders         CMP14 + Anion Gap         Protime-INR         Lipid Profile         CBC no Diff         Hepatitis panel, acute       Tests ordered today:  None   Referrals ordered today:   Referral Orders  No referral(s) requested today     I have ordered the following medication/changed the following medications:   Stop the following medications: Medications Discontinued During This Encounter  Medication Reason   sertraline  (ZOLOFT ) 25 MG tablet    fluticasone  (FLONASE ) 50 MCG/ACT nasal spray Reorder   amLODipine -olmesartan  (AZOR ) 10-40 MG tablet Reorder   Semaglutide ,0.25 or 0.5MG /DOS, 2 MG/3ML SOPN Reorder   Semaglutide ,0.25 or 0.5MG /DOS, 2 MG/3ML SOPN      Start the following medications: Meds ordered this encounter  Medications   amLODipine -olmesartan  (AZOR ) 10-40 MG tablet    Sig: Take 1 tablet by mouth daily.    Dispense:  90 tablet    Refill:  3   fluticasone  (FLONASE ) 50 MCG/ACT nasal spray    Sig: Place 2 sprays into both nostrils 2 (two) times daily.    Dispense:  1 g    Refill:  3   DISCONTD: Semaglutide ,0.25 or 0.5MG /DOS, 2 MG/3ML SOPN    Sig: Inject 1 mg into the skin once a week.    Dispense:  9 mL    Refill:  1   escitalopram  (LEXAPRO ) 10 MG tablet    Sig: Take 1  tablet (10 mg total) by mouth daily.    Dispense:  90 tablet    Refill:  3   Semaglutide , 1 MG/DOSE, 4 MG/3ML SOPN    Sig: Inject 1 mg into the skin once a week.    Dispense:  3 mL    Refill:  3    PLEASE MAKE SURE SHE ONLY GETS THE 1 mg PEN, not the 0.5 mg pen     Follow up: 2 weeks RN blood pressure, and 3 months HTN and DM    Remember:   Should you have any questions or concerns please call the internal medicine clinic at [phone].     Lanney Pitts, DO [facility]

## 2024-04-20 LAB — ACUTE VIRAL HEPATITIS (HAV, HBV, HCV)
HCV Ab: NONREACTIVE
Hep A IgM: NEGATIVE
Hep B C IgM: NEGATIVE
Hepatitis B Surface Ag: NEGATIVE

## 2024-04-20 LAB — LIPID PANEL

## 2024-04-20 LAB — HCV INTERPRETATION

## 2024-04-21 LAB — CBC
Hematocrit: 41 % (ref 34.0–46.6)
Hemoglobin: 13.5 g/dL (ref 11.1–15.9)
MCH: 27.8 pg (ref 26.6–33.0)
MCHC: 32.9 g/dL (ref 31.5–35.7)
MCV: 84 fL (ref 79–97)
Platelets: 339 10*3/uL (ref 150–450)
RBC: 4.86 x10E6/uL (ref 3.77–5.28)
RDW: 15.1 % (ref 11.7–15.4)
WBC: 6.9 10*3/uL (ref 3.4–10.8)

## 2024-04-21 LAB — CMP14 + ANION GAP
ALT: 18 IU/L (ref 0–32)
AST: 16 IU/L (ref 0–40)
Albumin: 4.5 g/dL (ref 3.8–4.9)
Alkaline Phosphatase: 85 IU/L (ref 44–121)
Anion Gap: 14 mmol/L (ref 10.0–18.0)
BUN/Creatinine Ratio: 18 (ref 9–23)
BUN: 10 mg/dL (ref 6–24)
Bilirubin Total: 0.3 mg/dL (ref 0.0–1.2)
CO2: 30 mmol/L — ABNORMAL HIGH (ref 20–29)
Calcium: 9.2 mg/dL (ref 8.7–10.2)
Chloride: 99 mmol/L (ref 96–106)
Creatinine, Ser: 0.57 mg/dL (ref 0.57–1.00)
Globulin, Total: 3.1 g/dL (ref 1.5–4.5)
Glucose: 122 mg/dL — ABNORMAL HIGH (ref 70–99)
Potassium: 3.4 mmol/L — ABNORMAL LOW (ref 3.5–5.2)
Sodium: 143 mmol/L (ref 134–144)
Total Protein: 7.6 g/dL (ref 6.0–8.5)
eGFR: 107 mL/min/{1.73_m2} (ref >59)

## 2024-04-21 LAB — LIPID PANEL
Cholesterol, Total: 206 mg/dL — ABNORMAL HIGH (ref 100–199)
HDL: 47 mg/dL (ref >39)
LDL CALC COMMENT:: 4.4 ratio (ref 0.0–4.4)
LDL Chol Calc (NIH): 137 mg/dL — ABNORMAL HIGH (ref 0–99)
Triglycerides: 123 mg/dL (ref 0–149)
VLDL Cholesterol Cal: 22 mg/dL (ref 5–40)

## 2024-04-23 NOTE — Progress Notes (Signed)
 Internal Medicine Clinic Attending  Case discussed with the resident at the time of the visit.  We reviewed the resident's history and exam and pertinent patient test results.  I agree with the assessment, diagnosis, and plan of care documented in the resident's note.

## 2024-05-07 ENCOUNTER — Ambulatory Visit

## 2024-05-07 DIAGNOSIS — R8271 Bacteriuria: Secondary | ICD-10-CM | POA: Diagnosis not present

## 2024-05-11 ENCOUNTER — Ambulatory Visit (HOSPITAL_COMMUNITY)
Admission: RE | Admit: 2024-05-11 | Discharge: 2024-05-11 | Disposition: A | Discharge status: Home / Self Care | Source: Ambulatory Visit | Attending: Internal Medicine | Admitting: Internal Medicine

## 2024-05-11 DIAGNOSIS — R519 Headache, unspecified: Secondary | ICD-10-CM | POA: Insufficient documentation

## 2024-07-18 DIAGNOSIS — R3121 Asymptomatic microscopic hematuria: Secondary | ICD-10-CM | POA: Diagnosis not present

## 2024-07-18 DIAGNOSIS — R102 Pelvic and perineal pain: Secondary | ICD-10-CM | POA: Diagnosis not present

## 2024-07-18 DIAGNOSIS — H25812 Combined forms of age-related cataract, left eye: Secondary | ICD-10-CM | POA: Diagnosis not present

## 2024-07-18 DIAGNOSIS — R300 Dysuria: Secondary | ICD-10-CM | POA: Diagnosis not present

## 2024-07-18 DIAGNOSIS — Z961 Presence of intraocular lens: Secondary | ICD-10-CM | POA: Diagnosis not present

## 2024-07-18 DIAGNOSIS — E119 Type 2 diabetes mellitus without complications: Secondary | ICD-10-CM | POA: Diagnosis not present

## 2024-07-18 LAB — HM DIABETES EYE EXAM

## 2024-07-24 ENCOUNTER — Telehealth: Payer: Self-pay | Admitting: Student

## 2024-07-24 ENCOUNTER — Ambulatory Visit: Payer: Self-pay

## 2024-07-24 NOTE — Telephone Encounter (Signed)
 Appointment made for today 07/24/2024 at 10:45 AM with Dr Melvenia Morrison   FYI Only or Action Required?: FYI only for provider.  Patient was last seen in primary care on 04/19/2024 by Heddy Barren, DO.  Called Nurse Triage reporting Headache.  Symptoms began two weeks ago.  Interventions attempted: OTC medications: Tylenol  and Aleve and Rest, hydration, or home remedies.  Symptoms are: unchanged.  Triage Disposition: See HCP Within 4 Hours (Or PCP Triage)  Patient/caregiver understands and will follow disposition?: Yes                      Copied from CRM #[id number]. Topic: Clinical - Red Word Triage >> Jul 24, 2024  8:47 AM Cherylann RAMAN wrote: Red Word that prompted transfer to Nurse Triage: Kareen, daughter called in and stated that the patient has been having headaches for 2 weeks. Patient has taken OTC medications to relieve the headache but nothing worked. Patient tried Aleve and Tylenol . Geradine also indicates that the patient has been having some really high blood pressure readings that will not go down and the hypertension medication is not helping. Reason for Disposition  [1] SEVERE headache (e.g., excruciating) AND [2] not improved after 2 hours of pain medicine  Answer Assessment - Initial Assessment Questions Patient's daughter called and is with the patient at this time She has had a headache for the past two weeks She has had worse headaches before in the past Denies any falls or injuries Patient states she also has been clenching her jaw for about 3-4 days Patient states that she thinks her headache might be from blood pressure but they have no way of checking it at this time She does take Amlodipine  She has taken Tylenol  and Aleve but they aren't helping anymore Pain around temples, forehead, and around her eyes, with a little nausea but no other symptoms reported No weakness, changes in speech, changes in vision, syncopal episodes, or fevers Patient's  daughter is advised that if anything worsens to go to the Emergency Room. She verbalized understanding.     1. LOCATION: Where does it hurt?      Temples, forehead, and around eyes 2. ONSET: When did the headache start? (e.g., minutes, hours, days)      2 weeks ago 3. PATTERN: Does the pain come and go, or has it been constant since it started?     Constant 4. SEVERITY: How bad is the pain? and What does it keep you from doing?  (e.g., Scale 1-10; mild, moderate, or severe)     8 5. RECURRENT SYMPTOM: Have you ever had headaches before? If Yes, ask: When was the last time? and What happened that time?      ---- 6. CAUSE: What do you think is causing the headache?     Blood pressure 7. MIGRAINE: Have you been diagnosed with migraine headaches? If Yes, ask: Is this headache similar?      This doesn't feel like a migraine per patient 8. HEAD INJURY: Has there been any recent injury to your head?      No 9. OTHER SYMPTOMS: Do you have any other symptoms? (e.g., fever, stiff neck, eye pain, sore throat, cold symptoms)     Nausea  Protocols used: Headache-A-AH

## 2024-07-24 NOTE — Telephone Encounter (Signed)
 Attempted to contact patient via telephone to reschedule appointment that was no showed today at our office with Dr. Napoleon.  Was unable to contact patient, but left detailed message to call our office back to reschedule her missed appointment.

## 2024-07-25 ENCOUNTER — Other Ambulatory Visit: Payer: Self-pay | Admitting: Student

## 2024-07-26 ENCOUNTER — Encounter: Payer: Self-pay | Admitting: Student

## 2024-08-09 ENCOUNTER — Ambulatory Visit: Payer: Self-pay

## 2024-08-09 VITALS — BP 141/89 | HR 92 | Temp 97.7°F | Ht 62.0 in | Wt 163.6 lb

## 2024-08-09 DIAGNOSIS — Z7985 Long-term (current) use of injectable non-insulin antidiabetic drugs: Secondary | ICD-10-CM | POA: Diagnosis not present

## 2024-08-09 DIAGNOSIS — E119 Type 2 diabetes mellitus without complications: Secondary | ICD-10-CM | POA: Diagnosis not present

## 2024-08-09 DIAGNOSIS — K760 Fatty (change of) liver, not elsewhere classified: Secondary | ICD-10-CM

## 2024-08-09 DIAGNOSIS — E785 Hyperlipidemia, unspecified: Secondary | ICD-10-CM | POA: Diagnosis not present

## 2024-08-09 DIAGNOSIS — I152 Hypertension secondary to endocrine disorders: Secondary | ICD-10-CM | POA: Diagnosis not present

## 2024-08-09 DIAGNOSIS — F3289 Other specified depressive episodes: Secondary | ICD-10-CM | POA: Diagnosis not present

## 2024-08-09 DIAGNOSIS — Z7984 Long term (current) use of oral hypoglycemic drugs: Secondary | ICD-10-CM

## 2024-08-09 DIAGNOSIS — R0683 Snoring: Secondary | ICD-10-CM

## 2024-08-09 DIAGNOSIS — I10 Essential (primary) hypertension: Secondary | ICD-10-CM

## 2024-08-09 DIAGNOSIS — E269 Hyperaldosteronism, unspecified: Secondary | ICD-10-CM | POA: Insufficient documentation

## 2024-08-09 LAB — POCT GLYCOSYLATED HEMOGLOBIN (HGB A1C): HbA1c, POC (controlled diabetic range): 6.6 % (ref 0.0–7.0)

## 2024-08-09 LAB — GLUCOSE, CAPILLARY: Glucose-Capillary: 143 mg/dL — ABNORMAL HIGH (ref 70–99)

## 2024-08-09 MED ORDER — SPIRONOLACTONE 50 MG PO TABS: 50.0000 mg | ORAL_TABLET | Freq: Every day | ORAL | 3 refills | Status: DC

## 2024-08-09 MED ORDER — ATORVASTATIN CALCIUM 40 MG PO TABS: 40.0000 mg | ORAL_TABLET | Freq: Every day | ORAL | 3 refills | Status: AC

## 2024-08-09 NOTE — Patient Instructions (Addendum)
????? ???? ?????? ???? ?. ??????? ??? ????? ?????? ??? ?????? ??????? ??? ?????. ?????? ?????...  > ?????? ??? ???? - ??? ??? ???? ??????? ??? ????? ?????. ?????? ???? ????? ?? ???? ????? ????????????? ??? ????? ????? ????? ????? ?? ?????? ??? ???? ?????. ?????? ?????? ???? ????????????? ???   50 ??? ??? ????? ??????. ?????? ????????? ?? ????? ?????????-??????????? ????? 10-40 ??? ??? ?? ????? ??????. ??? ??????? ?????? ????? ???.  > ??? ?????? - ??? ????? ???????????? ?????? ????? ????? 6.6? ??? ?????? ???? ?????. ????? ?????? ????????? ???? ??? ????? ??????? ????? ??????? ?????????. > ?????? ??????????? - ???? ?????? ???????????? ?? ??? ??? ????? ??????.  ????? ?? ???????? ???????? ???????:  ????? ???????? ???????? ??????? ???? ???? ????????? ?????? ?? ???? ??????? ??????? ???????????? ?????? (Hbg A1C) ??? ???? ???????  ???????? ???????? ?????:  ????? ??????? ????? ?????? ??? ?????? ?????  ????????: ??? ? ???? ?? ????? ???? ??????? ?????? ???? ??? ????  ??????: ??? ????????? ?? ?????????? ????? ??????? ?????? ???? ??????? ??? ????? ???-???-????.  ???????? ?????? ???? ??? ???? ???? ???????  Thank you, Ms.Giuseppina ROMI RATHEL, for allowing us  to provide your care today. Today we discussed . . .  > High blood pressure       - Your blood pressure was a little high today.  We discussed that there is a hormone in your blood called aldosterone that is high and is causing some of your high blood pressure.  We would like for you to increase the dose of your spironolactone  to 50 mg once daily.  You may continue to take your amlodipine -olmesartan  10-40 mg as previously prescribed.  I have also referred you for a sleep study. > Diabetes       - Your A1c today was 6.6, which is very well-controlled.  Keep taking your metformin  1000 mg twice daily and your Ozempic  weekly > High cholesterol       - Start taking atorvastatin  40 mg once daily   I have ordered the following labs for you:  Lab  Orders         Glucose, capillary         Basic metabolic panel with GFR         POC Hbg A1C       Referrals ordered today:   Referral Orders         Ambulatory referral to Sleep Studies        Follow up: 3 months with a physician and in 2 weeks for a BP visit    Remember:  Should you have any questions or concerns please call the internal medicine clinic at [phone].     Melvenia Morrison, [facility]

## 2024-08-09 NOTE — Assessment & Plan Note (Signed)
 She has had hyperlipidemia in the past.  She has not been taking her atorvastatin  40 mg because she did not know why she was needing to take this.  She was counseled on the need to reduce her risk for cardiac arrest and stroke given her diabetes and high cholesterol.  - Reordered atorvastatin  40 mg to be taken once daily

## 2024-08-09 NOTE — Assessment & Plan Note (Deleted)
 She has a history of hypertension. Her current regimen is Amlodipine -olmesartan  10-40mg . She reports that she is adherent to her medications. She denies headaches, chest pain, shortness of breath, peripheral edema, and vision changes. Checks BP at the pharmacy and it is between 155 and 160. Her BP in the office today is ***.

## 2024-08-09 NOTE — Assessment & Plan Note (Addendum)
 She has a history of hypertension.  She had testing for hyperaldosteronism which showed an elevated aldosterone to renin ratio.  She has also been hypokalemic in the past.  Her current regimen is Amlodipine -olmesartan  10-40mg  and spironolactone  25 mg. She reports that she is adherent to her medications. She denies headaches, chest pain, shortness of breath, peripheral edema, and vision changes. Checks BP at the pharmacy and it is between 155 and 160. Her BP in the office today is 164/11, 141/89 on recheck.  Given her continued uncontrolled hypertension and history of hyperaldosteronism, we will increase her spironolactone  to 50 mg once daily.   -Continue amlodipine -olmesartan  10-40mg  -Increase spironolactone  to 50mg  daily -Check BMP today to evaluate hypokalemia - Follow-up for nurse only blood pressure visit in 2 weeks - Follow-up with physician in 3 months  Addendum: There is a 12 mm left adrenal nodule noted on her CT hematuria workup scan from 03/28/2024. Would consider dedicated adrenal CT scan and depending on the results, a referral to endocrinology for confirmatory testing and potentially a referral to surgery for management of this nodule.

## 2024-08-09 NOTE — Assessment & Plan Note (Signed)
 Noted on CT findings. Denies any alcohol use. No abdominal pain.  CMP, PT/INR, CBC, and hepatitis panel did not show any apparent cause for this.

## 2024-08-09 NOTE — Assessment & Plan Note (Addendum)
 Last A1c 6.5 in February 2025.  Current regimen Ozempic  1 mg weekly and metformin  1000 mg twice daily.  He denies any hypoglycemic events.  She denies any changes in her vision.  She denies any peripheral neuropathy.  Her A1c today is 6.6.  It remains well-controlled.  -Continue current regimen of metformin  1000 mg twice daily and Ozempic  1 mg weekly -Follow-up in 3 months for A1c check and diabetes management

## 2024-08-09 NOTE — Assessment & Plan Note (Signed)
 Patient states that she is not depressed.  PHQ-9 0 today.  She has not taken the Lexapro .

## 2024-08-09 NOTE — Progress Notes (Addendum)
 CC: Routine Follow Up for HTN and DMII after last office visit 04/19/2024  HPI:  Ashley Huynh is a 58 y.o. 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 Please see problem based assessment and plan for further history.  Review of Systems  Constitutional:  Positive for diaphoresis. Negative for chills, fever and weight loss.  Gastrointestinal:  Positive for nausea. Negative for vomiting.  Skin:  Negative for rash.    Medications: Current Outpatient Medications  Medication Instructions   Accu-Chek Softclix Lancets lancets Use as instructed   amLODipine -olmesartan  (AZOR ) 10-40 MG tablet 1 tablet, Oral, Daily   atorvastatin  (LIPITOR) 40 mg, Oral, Daily   blood glucose meter kit and supplies Dispense based on patient and insurance preference. Use up to four times daily as directed. (FOR ICD-10 E10.9, E11.9).   Blood Glucose Monitoring Suppl (FREESTYLE LITE) w/Device KIT 1 each, Does not apply, Daily   carbamide peroxide (DEBROX) 6.5 % OTIC solution 5 drops, Right EAR, Daily   diclofenac  Sodium (VOLTAREN ) 1 % GEL APPLY 4 GRAMS TOPICALLY 4 TIMES DAILY   fluticasone  (FLONASE ) 50 MCG/ACT nasal spray 2 sprays, Each Nare, 2 times daily   glucose blood (FREESTYLE LITE) test strip Use as instructed   Lancets (FREESTYLE) lancets Use as instructed   metFORMIN  (GLUCOPHAGE ) 1,000 mg, Oral, 2 times daily with meals   OZEMPIC , 1 MG/DOSE, 4 MG/3ML SOPN INJECT 1MG  INTO THE SKIN ONCE WEEKLY   Polyethyl Glycol-Propyl Glycol (LUBRICANT EYE DROPS) 0.4-0.3 % SOLN 1 drop, Ophthalmic, 2 times daily PRN   spironolactone  (ALDACTONE ) 25 mg, Oral, Daily     Physical Exam:  Vitals:   08/09/24 1530 08/09/24 1609  BP: (!) 164/111 (!) 141/89  Pulse: 92 92  Temp: 97.7 F (36.5 C)   TempSrc: Oral   SpO2: 96%   Weight: 163 lb 9.6 oz (74.2 kg)   Height: 5' 2 (1.575 m)     Physical  Exam Constitutional:      General: She is not in acute distress.    Appearance: She is not ill-appearing or diaphoretic.  Cardiovascular:     Rate and Rhythm: Normal rate and regular rhythm.  Pulmonary:     Effort: No respiratory distress.  Musculoskeletal:     Right lower leg: No edema.     Left lower leg: No edema.  Neurological:     Mental Status: She is alert.       Assessment & Plan:   Assessment & Plan Hypertension due to endocrine disorder She has a history of hypertension.  She had testing for hyperaldosteronism which showed an elevated aldosterone to renin ratio.  She has also been hypokalemic in the past.  Her current regimen is Amlodipine -olmesartan  10-40mg  and spironolactone  25 mg. She reports that she is adherent to her medications. She denies headaches, chest pain, shortness of breath, peripheral edema, and vision changes. Checks BP at the pharmacy and it is between 155 and 160. Her BP in the office today is 164/11, 141/89 on recheck.  Given her continued uncontrolled hypertension and history of hyperaldosteronism, we will increase her spironolactone  to 50 mg once daily.   -Continue amlodipine -olmesartan  10-40mg  -Increase spironolactone  to 50mg  daily -Check BMP today to evaluate hypokalemia - Follow-up for nurse only blood pressure visit in 2 weeks - Follow-up with physician in 3 months  Addendum: There is a 12 mm left adrenal nodule noted on her CT hematuria workup scan from 03/28/2024. Would  consider dedicated adrenal CT scan and depending on the results, a referral to endocrinology for confirmatory testing and potentially a referral to surgery for management of this nodule.  Type 2 diabetes mellitus without complication, without long-term current use of insulin (HCC) Last A1c 6.5 in February 2025.  Current regimen Ozempic  1 mg weekly and metformin  1000 mg twice daily.  He denies any hypoglycemic events.  She denies any changes in her vision.  She denies any peripheral  neuropathy.  Her A1c today is 6.6.  It remains well-controlled.  -Continue current regimen of metformin  1000 mg twice daily and Ozempic  1 mg weekly -Follow-up in 3 months for A1c check and diabetes management Other depression Patient states that she is not depressed.  PHQ-9 0 today.  She has not taken the Lexapro . Nonalcoholic fatty liver disease Noted on CT findings. Denies any alcohol use. No abdominal pain.  CMP, PT/INR, CBC, and hepatitis panel did not show any apparent cause for this. Hyperlipidemia, unspecified hyperlipidemia type She has had hyperlipidemia in the past.  She has not been taking her atorvastatin  40 mg because she did not know why she was needing to take this.  She was counseled on the need to reduce her risk for cardiac arrest and stroke given her diabetes and high cholesterol.  - Reordered atorvastatin  40 mg to be taken once daily Snoring STOP-BANG score of 5.  Positive for snoring, gasping for breath while sleeping, age, hypertension.  - Referred for sleep study  Orders Placed This Encounter  Procedures   Glucose, capillary   Basic metabolic panel with GFR   Ambulatory referral to Sleep Studies    Referral Priority:   Routine    Referral Type:   Consultation    Referral Reason:   Specialty Services Required    Number of Visits Requested:   1   POC Hbg A1C    Patient seen with Dr. Ronnald Ashley Melvenia Napoleon, MD [facility] Internal Medicine Resident PGY-1 Clinic Phone: [phone] Please contact the on call pager at [phone] for any urgent or emergent needs.

## 2024-08-13 NOTE — Progress Notes (Signed)
 Internal Medicine Clinic Attending  I was physically present during the key portions of the resident provided service and participated in the medical decision making of patient's management care. I reviewed pertinent patient test results.  The assessment, diagnosis, and plan were formulated together and I agree with the documentation in the resident's note.  Dickie La, MD

## 2024-08-14 LAB — BASIC METABOLIC PANEL WITH GFR
BUN/Creatinine Ratio: 24 — ABNORMAL HIGH (ref 9–23)
BUN: 17 mg/dL (ref 6–24)
CO2: 20 mmol/L (ref 20–29)
Calcium: 9.8 mg/dL (ref 8.7–10.2)
Chloride: 102 mmol/L (ref 96–106)
Creatinine, Ser: 0.72 mg/dL (ref 0.57–1.00)
Glucose: 142 mg/dL — ABNORMAL HIGH (ref 70–99)
Potassium: 4.2 mmol/L (ref 3.5–5.2)
Sodium: 138 mmol/L (ref 134–144)
eGFR: 98 mL/min/1.73 (ref >59)

## 2024-08-25 ENCOUNTER — Ambulatory Visit
Admission: EM | Admit: 2024-08-25 | Discharge: 2024-08-25 | Disposition: A | Discharge status: Home / Self Care | Attending: Family Medicine | Admitting: Family Medicine

## 2024-08-25 ENCOUNTER — Other Ambulatory Visit: Payer: Self-pay

## 2024-08-25 DIAGNOSIS — B349 Viral infection, unspecified: Secondary | ICD-10-CM

## 2024-08-25 DIAGNOSIS — J029 Acute pharyngitis, unspecified: Secondary | ICD-10-CM | POA: Diagnosis not present

## 2024-08-25 DIAGNOSIS — H6993 Unspecified Eustachian tube disorder, bilateral: Secondary | ICD-10-CM

## 2024-08-25 LAB — POC SOFIA SARS ANTIGEN FIA: SARS Coronavirus 2 Ag: NEGATIVE

## 2024-08-25 LAB — POCT RAPID STREP A (OFFICE): Rapid Strep A Screen: NEGATIVE

## 2024-08-25 MED ORDER — FLUTICASONE PROPIONATE 50 MCG/ACT NA SUSP: 1.0000 | Freq: Every day | NASAL | 0 refills | Status: AC

## 2024-08-25 MED ORDER — CETIRIZINE-PSEUDOEPHEDRINE ER 5-120 MG PO TB12: 1.0000 | ORAL_TABLET | Freq: Every day | ORAL | 0 refills | Status: AC

## 2024-08-25 NOTE — ED Notes (Signed)
I  used interpreter to triage pt

## 2024-08-25 NOTE — ED Triage Notes (Signed)
 Pt c/o ear pain bilat, but right ear hurts worsex2d.

## 2024-08-25 NOTE — Discharge Instructions (Signed)
 Start Flonase  daily as well as Zyrtec -D daily to help with your ear pain.  Please treat your symptoms with over the counter cough medication, tylenol  or ibuprofen, humidifier, and rest. Viral illnesses can last 7-14 days. Please follow up with your PCP if your symptoms are not improving. Please go to the ER for any worsening symptoms. This includes but is not limited to fever you can not control with tylenol  or ibuprofen, you are not able to stay hydrated, you have shortness of breath or chest pain.  Thank you for choosing [facility] for your healthcare needs. I hope you feel better soon!

## 2024-08-25 NOTE — ED Provider Notes (Signed)
 [facility]    CSN: [id number] Arrival date & time: 08/25/24  9057      History   Chief Complaint No chief complaint on file.   HPI Ashley Huynh is a 58 y.o. female  presents for evaluation of URI symptoms for 2 days.  Translation line used as patient speaks Arabic.  Patient reports associated symptoms of bilateral ear pain and sore throat. Denies N/V/D, cough, congestion, body aches, shortness of breath, fever. Patient does not have a hx of asthma. Patient is not an active smoker.   Reports no known sick contacts.  Pt has taken Tylenol  OTC for symptoms. Pt has no other concerns at this time.   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   Diabetic foot ulcer associated with type 2 diabetes mellitus (HCC) 01/26/2021   Essential tremor 04/24/2013   Hyperlipidemia    Hypertension    Hypokalemia    Migraine 12/31/2013   T2DM (type 2 diabetes mellitus) (HCC) 09/13/2017    Patient Active Problem List   Diagnosis Date Noted   Hyperaldosteronism (HCC) 08/09/2024   Nonalcoholic fatty liver disease 04/19/2024   Depression 10/20/2023   Heartburn 07/20/2023   Acne 04/13/2023   Breast pain, left 03/23/2023   Cough 08/24/2022   Unintentional weight loss 03/10/2022   Trapezius muscle spasm 11/16/2021   Hyperlipidemia 10/22/2021   Screening for malignant neoplasm of cervix 09/30/2021   Fatigue 09/11/2021   Bacterial sinusitis 03/02/2021   Type 2 diabetes mellitus (HCC) 09/07/2017   Hypokalemia 02/02/2017   Oral thrush 04/24/2013   Healthcare maintenance 01/10/2013   Skin lesion 11/14/2012   Dysuria 02/29/2012   Hypertension 08/29/2007   Headache 08/29/2007    Past Surgical History:  Procedure Laterality Date   EXTERNAL EAR SURGERY     right    OB History     Gravida  5   Para  4   Term  4   Preterm  0   AB  1   Living  4      SAB  1   IAB      Ectopic      Multiple      Live  Births               Home Medications    Prior to Admission medications   Medication Sig Start Date End Date Taking? Authorizing Provider  cetirizine -pseudoephedrine  (ZYRTEC -D) 5-120 MG tablet Take 1 tablet by mouth daily. 08/25/24  Yes Suly Vukelich, Jodi R, NP  fluticasone  (FLONASE ) 50 MCG/ACT nasal spray Place 1 spray into both nostrils daily. 08/25/24  Yes Yeiden Frenkel, Jodi R, NP  Accu-Chek Softclix Lancets lancets Use as instructed 09/30/21   Demaio, Alexa, MD  amLODipine -olmesartan  (AZOR ) 10-40 MG tablet Take 1 tablet by mouth daily. 04/19/24   Tawkaliyar, Roya, DO  atorvastatin  (LIPITOR) 40 MG tablet Take 1 tablet (40 mg total) by mouth daily. 08/09/24 08/09/25  Celestina Czar, MD  blood glucose meter kit and supplies Dispense based on patient and insurance preference. Use up to four times daily as directed. (FOR ICD-10 E10.9, E11.9). 09/11/21   Gawaluck, Greylon, MD  Blood Glucose Monitoring Suppl (FREESTYLE LITE) w/Device KIT 1 each by Does not apply route daily. 09/30/21   Demaio, Alexa, MD  diclofenac  Sodium (VOLTAREN ) 1 % GEL APPLY 4 GRAMS TOPICALLY 4 TIMES DAILY 09/08/22   Fernand Prost, MD  glucose blood (FREESTYLE LITE) test strip Use as instructed  09/30/21   Demaio, Alexa, MD  Lancets (FREESTYLE) lancets Use as instructed 09/30/21   Demaio, Alexa, MD  metFORMIN  (GLUCOPHAGE ) 1000 MG tablet Take 1 tablet (1,000 mg total) by mouth 2 (two) times daily with a meal. 01/31/24   Marylu Gee, DO  OZEMPIC , 1 MG/DOSE, 4 MG/3ML SOPN INJECT 1MG  INTO THE SKIN ONCE WEEKLY 07/25/24   Tawkaliyar, Roya, DO  lisinopril -hydrochlorothiazide  (PRINZIDE ) 20-12.5 MG per tablet Take 2 tablets by mouth daily. 02/29/12 03/14/12  Niu, Xilin, MD    Family History Family History  Problem Relation Age of Onset   Hypertension Mother    Hypertension Father    Colon cancer Neg Hx    Esophageal cancer Neg Hx    Stomach cancer Neg Hx    Rectal cancer Neg Hx     Social History Social History   Tobacco Use   Smoking status:  Never   Smokeless tobacco: Never  Vaping Use   Vaping status: Never Used  Substance Use Topics   Alcohol use: No    Alcohol/week: 0.0 standard drinks of alcohol   Drug use: No     Allergies   Other   Review of Systems Review of Systems  HENT:  Positive for ear pain and sore throat.      Physical Exam Triage Vital Signs ED Triage Vitals  Encounter Vitals Group     BP 08/25/24 0957 135/86     Girls Systolic BP Percentile --      Girls Diastolic BP Percentile --      Boys Systolic BP Percentile --      Boys Diastolic BP Percentile --      Pulse Rate 08/25/24 0957 75     Resp 08/25/24 0957 16     Temp 08/25/24 0957 97.7 F (36.5 C)     Temp Source 08/25/24 0957 Oral     SpO2 08/25/24 0957 96 %     Weight --      Height --      Head Circumference --      Peak Flow --      Pain Score 08/25/24 0953 8     Pain Loc --      Pain Education --      Exclude from Growth Chart --    No data found.  Updated Vital Signs BP 135/86   Pulse 75   Temp 97.7 F (36.5 C) (Oral)   Resp 16   LMP 05/23/2019 Comment: abnormal  SpO2 96%   Visual Acuity Right Eye Distance:   Left Eye Distance:   Bilateral Distance:    Right Eye Near:   Left Eye Near:    Bilateral Near:     Physical Exam Vitals and nursing note reviewed.  Constitutional:      General: She is not in acute distress.    Appearance: She is well-developed. She is not ill-appearing.  HENT:     Head: Normocephalic and atraumatic.     Right Ear: Ear canal normal. A middle ear effusion is present.     Left Ear: Ear canal normal. A middle ear effusion is present.     Nose: No congestion or rhinorrhea.     Mouth/Throat:     Mouth: Mucous membranes are moist.     Pharynx: Oropharynx is clear. Uvula midline. Posterior oropharyngeal erythema present.     Tonsils: No tonsillar exudate or tonsillar abscesses.  Eyes:     Conjunctiva/sclera: Conjunctivae normal.     Pupils: Pupils are  equal, round, and reactive to  light.  Cardiovascular:     Rate and Rhythm: Normal rate and regular rhythm.     Heart sounds: Normal heart sounds.  Pulmonary:     Effort: Pulmonary effort is normal.     Breath sounds: Normal breath sounds.  Musculoskeletal:     Cervical back: Normal range of motion and neck supple.  Lymphadenopathy:     Cervical: No cervical adenopathy.  Skin:    General: Skin is warm and dry.  Neurological:     General: No focal deficit present.     Mental Status: She is alert and oriented to person, place, and time.  Psychiatric:        Mood and Affect: Mood normal.        Behavior: Behavior normal.      UC Treatments / Results  Labs (all labs ordered are listed, but only abnormal results are displayed) Labs Reviewed  POC SOFIA SARS ANTIGEN FIA  POCT RAPID STREP A (OFFICE)    EKG   Radiology No results found.  Procedures Procedures (including critical care time)  Medications Ordered in UC Medications - No data to display  Initial Impression / Assessment and Plan / UC Course  I have reviewed the triage vital signs and the nursing notes.  Pertinent labs & imaging results that were available during my care of the patient were reviewed by me and considered in my medical decision making (see chart for details).     Reviewed exam and symptoms with patient.  No red flags.  Negative COVID and strep testing.  Discussed viral illness and symptomatic treatment.  Flonase  daily as well as Zyrtec -D for eustachian tube dysfunction.  Encouraged rest fluids and PCP follow-up if symptoms do not improve.  ER precautions reviewed. Final Clinical Impressions(s) / UC Diagnoses   Final diagnoses:  Sore throat  Viral illness  Eustachian tube dysfunction, bilateral     Discharge Instructions      Start Flonase  daily as well as Zyrtec -D daily to help with your ear pain.  Please treat your symptoms with over the counter cough medication, tylenol  or ibuprofen, humidifier, and rest. Viral  illnesses can last 7-14 days. Please follow up with your PCP if your symptoms are not improving. Please go to the ER for any worsening symptoms. This includes but is not limited to fever you can not control with tylenol  or ibuprofen, you are not able to stay hydrated, you have shortness of breath or chest pain.  Thank you for choosing [facility] for your healthcare needs. I hope you feel better soon!      ED Prescriptions     Medication Sig Dispense Auth. Provider   fluticasone  (FLONASE ) 50 MCG/ACT nasal spray Place 1 spray into both nostrils daily. 15.8 mL Shamyia Grandpre, Jodi R, NP   cetirizine -pseudoephedrine  (ZYRTEC -D) 5-120 MG tablet Take 1 tablet by mouth daily. 30 tablet Aubreyana Saltz, Jodi R, NP      PDMP not reviewed this encounter.   Loreda Myla SAUNDERS, NP 08/25/24 1034

## 2024-09-24 ENCOUNTER — Telehealth: Payer: Self-pay

## 2024-09-24 NOTE — Telephone Encounter (Signed)
 Prior Authorization for patient (Ozempic  (0.25 or 0.5 MG/DOSE) 2MG /3ML pen-injectors) came through on cover my meds was submitted with last office notes and labs awaiting approval or denial.  XZB:[id number]

## 2024-09-24 NOTE — Telephone Encounter (Signed)
 Richad Hubert (Key: [id number]) Ozempic  (0.25 or 0.5 MG/DOSE) 2MG /3ML pen-injectors Form [employer] Electronic Prior Authorization Form Created 3 days ago Sent to Plan 3 days ago Plan Response 3 days ago Submit Clinical Questions 3 days ago Determination Favorable 3 days ago Message from Plan Approved. OZEMPIC  (0.25 OR 0.5 MG/DOSE) 2MG /3ML Soln Pen-inj is approved from 09/21/2024 to 09/21/2025.SABRA Authorization Expiration Date: September 21, 2025.

## 2024-10-05 ENCOUNTER — Ambulatory Visit: Payer: Self-pay

## 2024-10-05 NOTE — Telephone Encounter (Signed)
 Connection lost in transfer- call dropped.   Copied from CRM #[id number]. Topic: Clinical - Red Word Triage >> Oct 05, 2024  9:55 AM Chiquita SQUIBB wrote: Red Word that prompted transfer to Nurse Triage: Patients daughter is calling in with the patient for a painful throat, headaches, cold sweats, and nausea.

## 2024-10-05 NOTE — Telephone Encounter (Signed)
 Call to patient using [facility] [phone]. Unable to reach patient .  Message was left to go to an Urgent Care if symptoms worsen before her appointment on 10/08/2024.

## 2024-10-05 NOTE — Telephone Encounter (Signed)
  FYI Only or Action Required?: FYI only for provider.  Patient was last seen in primary care on 08/09/2024 by Ashley Limes, MD.  Called Nurse Triage reporting Sore Throat, Headache, and Nausea.  Symptoms began several weeks ago.  Interventions attempted: Rest, hydration, or home remedies.  Symptoms are: unchanged.  Triage Disposition: See Physician Within 24 Hours  Patient/caregiver understands and will follow disposition?: Yes             Reason for Disposition  Earache also present    Scheduled patient on the next available AV appt on October 08, 2024 with IMP . Triager strongly advised UC over the weekend for worsening sx.  Answer Assessment - Initial Assessment Questions 1. ONSET: When did the throat start hurting? (Hours or days ago)      X 2 weeks 2. SEVERITY: How bad is the sore throat? (Scale 1-10; mild, moderate or severe)     Moderate, still able to maintain hydration. 3. STREP EXPOSURE: Has there been any exposure to strep within the past week? If Yes, ask: What type of contact occurred?      Unknown. 4.  VIRAL SYMPTOMS: Are there any symptoms of a cold, such as a runny nose, cough, hoarse voice or red eyes?      Headache, nausea 5. FEVER: Do you have a fever? If Yes, ask: What is your temperature, how was it measured, and when did it start?     Denies, but does endorse chills. 6. PUS ON THE TONSILS: Is there pus on the tonsils in the back of your throat?     Denies, but endorses itchy and trouble swallowing 7. OTHER SYMPTOMS: Do you have any other symptoms? (e.g., difficulty breathing, headache, rash)     Headache, nausea, L ear pain. 8. PREGNANCY: Is there any chance you are pregnant? When was your last menstrual period?     denies  Protocols used: Sore Throat-A-AH

## 2024-10-07 DIAGNOSIS — R519 Headache, unspecified: Secondary | ICD-10-CM | POA: Diagnosis not present

## 2024-10-07 DIAGNOSIS — Z20822 Contact with and (suspected) exposure to covid-19: Secondary | ICD-10-CM | POA: Diagnosis not present

## 2024-10-07 DIAGNOSIS — J029 Acute pharyngitis, unspecified: Secondary | ICD-10-CM | POA: Diagnosis not present

## 2024-10-07 DIAGNOSIS — E876 Hypokalemia: Secondary | ICD-10-CM | POA: Diagnosis not present

## 2024-10-07 NOTE — ED Notes (Signed)
 Placed on cardiac monitor due to hypokalemia and K+ running   Avelina ONEIDA Leech, RN 10/07/24 1015

## 2024-10-07 NOTE — ED Notes (Signed)
 Patient reports headache much improved   Avelina ONEIDA Leech, RN 10/07/24 [phone]

## 2024-10-07 NOTE — ED Provider Notes (Signed)
 [facility] Emergency Department Emergency Department Provider Note  Provider at Bedside:  10/07/2024 7:04 AM  Chief Complaint: Headache  History of Present Illness:  History obtained from: Patient  Ashley Huynh is a 58 y.o. female with PMHx of hypertension, type 2 diabetes, migraines who presents to the ED with complaints of a worsening right-sided headache for the last week. She also endorses hot flashes, cold chills, and photosensitivity. Patient endorses history of migraines, but states that the last one was over one year ago. She does not take any migraine medications. She denies any other complaints at this time.  _______________ ROS: Pertinent positives and negatives per HPI. Pertinent past medical, surgical, social and family history records were reviewed. Current Medications and Allergies were reviewed.  Physical Exam   Vitals:   10/07/24 0700 10/07/24 1118 10/07/24 1145 10/07/24 1200  BP: 145/76 138/88  (!) 147/96  BP Location:  Right arm    Patient Position:  Lying    Pulse: 72 104 84 83  Resp: 19 19 18 19   Temp:      TempSrc:      SpO2: 94% 98% 97% 96%  Weight:      Height:          Physical Exam Vitals and nursing note reviewed.  Constitutional:      General: She is not in acute distress.    Appearance: Normal appearance.  HENT:     Head: Normocephalic and atraumatic.     Comments: Mild redness to the back of the throat    Right Ear: External ear normal.     Left Ear: External ear normal.     Nose: Nose normal.     Mouth/Throat:     Mouth: Mucous membranes are moist.  Eyes:     Pupils: Pupils are equal, round, and reactive to light.  Cardiovascular:     Rate and Rhythm: Normal rate and regular rhythm.     Heart sounds: Normal heart sounds.  Pulmonary:     Effort: Pulmonary effort is normal.     Breath sounds: Normal breath sounds.  Abdominal:     Palpations: Abdomen is soft.     Tenderness: There is no abdominal tenderness.   Musculoskeletal:        General: Normal range of motion.     Cervical back: Normal range of motion.  Skin:    General: Skin is warm.  Neurological:     General: No focal deficit present.     Mental Status: She is alert and oriented to person, place, and time.  Psychiatric:        Mood and Affect: Mood normal.        Behavior: Behavior normal.        Thought Content: Thought content normal.     Results   EKG Impression:  My Interpretation: None performed  Labs: Lab Results (last 24 hours)     Procedure Component Value Ref Range Date/Time   Basic Metabolic Panel [[id number]]  (Abnormal) Collected: 10/07/24 1219   Lab Status: Final result Specimen: Blood from Venous Updated: 10/07/24 1258    Sodium 139 136 - 145 mmol/L     Potassium 3.3* 3.4 - 4.5 mmol/L     Chloride 102 98 - 107 mmol/L     CO2 30 21 - 31 mmol/L     Comment: High lactate dehydrogenase (LDH) concentrations in patient samples may cause falsely increased bicarbonate results. If markedly elevated LDH levels are suspected, please  assess results in conjunction with patient's LDH values.      Anion Gap 7 6 - 14 mmol/L     Glucose, Random 169* 70 - 99 mg/dL     Blood Urea Nitrogen (BUN) 14 7 - 25 mg/dL     Creatinine 9.39 9.39 - 1.20 mg/dL     eGFR >09 >40 fO/fpw/8.26f7     Comment: GFR estimated by CKD-EPI equations(NKF 2021).   Recommend confirmation of Cr-based eGFR by using Cys-based eGFR and other filtration markers (if applicable) in complex cases and clinical decision-making, as needed.      Calcium  9.1 8.6 - 10.3 mg/dL     BUN/Creatinine Ratio --    Comment: Creatinine is normal, ratio is not clinically indicated.      CBC with Differential [[id number]]  (Abnormal) Collected: 10/07/24 0753   Lab Status: Final result Specimen: Blood from Venous Updated: 10/07/24 0806   Narrative:     The following orders were created for panel order CBC with Differential. Procedure                                Abnormality         Status                    ---------                               -----------         ------                    CBC with [id number]]       Abnormal            Final result               Please view results for these tests on the individual orders.   Comprehensive Metabolic Panel [[id number]]  (Abnormal) Collected: 10/07/24 0753   Lab Status: Final result Specimen: Blood from Venous Updated: 10/07/24 0837    Sodium 142 136 - 145 mmol/L     Potassium 2.6* 3.4 - 4.5 mmol/L     Chloride 99 98 - 107 mmol/L     CO2 36* 21 - 31 mmol/L     Comment: High lactate dehydrogenase (LDH) concentrations in patient samples may cause falsely increased bicarbonate results. If markedly elevated LDH levels are suspected, please assess results in conjunction with patient's LDH values.      Anion Gap 7 6 - 14 mmol/L     Glucose, Random 148* 70 - 99 mg/dL     Blood Urea Nitrogen (BUN) 14 7 - 25 mg/dL     Creatinine 9.37 9.39 - 1.20 mg/dL     eGFR >09 >40 fO/fpw/8.26f7     Comment: GFR estimated by CKD-EPI equations(NKF 2021).   Recommend confirmation of Cr-based eGFR by using Cys-based eGFR and other filtration markers (if applicable) in complex cases and clinical decision-making, as needed.      Albumin 4.3 3.5 - 5.7 g/dL     Total Protein 7.9 6.4 - 8.9 g/dL     Bilirubin, Total 0.4 0.3 - 1.0 mg/dL     Alkaline Phosphatase (ALP) 69 34 - 104 U/L     Aspartate Aminotransferase (AST) 11* 13 - 39 U/L     Alanine Aminotransferase (ALT) 11 7 - 52 U/L  Calcium  9.2 8.6 - 10.3 mg/dL     BUN/Creatinine Ratio --    Comment: Creatinine is normal, ratio is not clinically indicated.      CBC with Differential [[id number]]  (Abnormal) Collected: 10/07/24 0753   Lab Status: Final result Specimen: Blood from Venous Updated: 10/07/24 0806    WBC 5.99 4.40 - 11.00 10*3/uL     RBC 5.10 4.10 - 5.10 10*6/uL     Hemoglobin 14.2 12.3 - 15.3 g/dL     Hematocrit 58.2 64.0 - 44.6 %      Mean Corpuscular Volume (MCV) 81.7 80.0 - 96.0 fL     Mean Corpuscular Hemoglobin (MCH) 27.8 27.5 - 33.2 pg     Mean Corpuscular Hemoglobin Conc (MCHC) 34.0 33.0 - 37.0 g/dL     Red Cell Distribution Width (RDW) 15.4 12.3 - 17.0 %     Platelet Count (PLT) 311 150 - 450 10*3/uL     Mean Platelet Volume (MPV) 8.4 6.8 - 10.2 fL     Neutrophils % 49 %     Lymphocytes % 29 %     Monocytes % 7 %     Eosinophils % 14 %     Basophils % 1 %     Neutrophils Absolute 2.90 1.80 - 7.80 10*3/uL     Lymphocytes # 1.80 1.00 - 4.80 10*3/uL     Monocytes # 0.40 0.00 - 0.80 10*3/uL     Eosinophils # 0.80* 0.00 - 0.50 10*3/uL     Basophils # 0.10 0.00 - 0.20 10*3/uL    Magnesium  [[id number]]  (Abnormal) Collected: 10/07/24 0753   Lab Status: Final result Specimen: Blood from Venous Updated: 10/07/24 1207    Magnesium  1.6* 1.9 - 2.7 mg/dL    SARS-Cov-2, Flu, and RSV, Qualitative NAAT [[id number]]  (Normal) Collected: 10/07/24 0743   Lab Status: Final result Specimen: Swab from Nasopharynx Updated: 10/07/24 0908    SARS-CoV-2 Negative Negative     Influenza A Negative Negative     Influenza B Negative Negative     RSV Negative Negative    Narrative:     Results are for use in the simultaneous rapid in vitro detection and differentiation of SARS-CoV-2, RSV, influenza A virus, and influenza B virus nucleic acids by PCR in clinical specimens.   Positive results are indicative of active infection but do not rule out bacterial infection or co-infection with other pathogens not detected by the test. Clinical correlation with patient history and other diagnostic information is necessary to determine patient infection status. The agent detected may not be the definite cause of disease.   Negative results do not preclude SARS-CoV-2, RSV, influenza A, and/or influenza B infection and should not be used as the sole basis for diagnosis, treatment or other patient management decisions. Negative results must be combined  with clinical observations, patient history, and/or epidemiological information.   Test performed by [facility] qualified personnel using the Cepheid Xpert SARS-CoV-2 & Influenza A/B Nucleic acid test on the GeneXpert instrument.    Rapid Strep A Screen with Reflex to Strep A Culture [[id number]]  (Normal) Collected: 10/07/24 0743   Lab Status: Final result Specimen: Swab from Throat Updated: 10/07/24 0837    Strep A Screen Negative Negative        My interpretation of patient's labs shows that her initial potassium was 2.6 CO2 was 36 glucose was 148 and her magnesium  was 1.6.  Her COVID flu and RSV were negative rapid strep was negative and CBC was  normal.  CXR Impression: (Interpreted by me) None performed.  Impression of additional imaging studies include: None performed  Imaging: Radiology Results (last 72 hours)     ** No results found for the last 72 hours. **        Procedures   Procedures  Evidence Based Calculators      ED Course   ED Course as of 10/07/24 1406  Sun Oct 07, 2024  9263 Patient's initial vital signs are stable. [LT]  [id number] Intervention as patient will get routine labs, and we are getting her for some IV Decadron Toradol  and Benadryl  for her headache as well as her negative COVID flu and RSV test and strep test, since she has sore throat also and this headache was slightly different than her normal migraines. [LT]  0932 Patient's COVID flu and RSV and rapid strep were negative chemistry showed the patient's potassium was 2.6 though the rest was normal except for glucose of 148.  Patient will be given p.o. and IV potassium, and will reassess her headache at this time. [LT]  1046 Upon reassessment, patient's headache is much improved after the migraine cocktail.  We are still awaiting the rest of her potassium to run in. [LT]  1258 Pt's magnesium  was 1.6 so she was given 1mg  IV magnesium  and we are getting a repeat BMP to recheck her potassium after her po and  IV doses of potassium. [LT]  1357 Patient with potassium had improved to 3.3.  Her headache was much better.  We are going to give her 1 more dose of 20 mill equivalents p.o. prior to discharge and we are going to send her some Fioricet to take for her headaches and refer her to neurology as an outpatient.  Patient was also told to make sure that she follows up with her primary care doctor to check her labs and make sure potassium and magnesium  stayed within normal limits. [LT]  1358 Disposition-discharge. [LT]    ED Course User Index [LT] Rock Dannielle Birmingham, MD    Medical Decision Making   External records were reviewed: I have reviewed Internal Medicine Office Visit from 08/09/2024 for follow up of hypertension.  _________________________  Ashley Huynh is a 58 y.o. female who presents to the ED with complaints of a worsening right-sided headache for the last week. She also endorses hot flashes, cold chills, and photosensitivity. Patient endorses history of migraines, but states that the last one was over one year ago. She denies any other complaints at this time. On my initial evaluation, patient was in moderate distress from her headache but no neurological deficits and she had mild redness to her throat.  The following differentials were considered: IIK:fphmjpwz, tension, cluster, HTN, SAH, CVA, ICH, SDH, concussion, meningitis, abscess, encephalitis, increased ICP, pseudotumor, sinusitis, TMJ, temporal arteritis, mastoiditis, glaucoma, carbon monoxide poisoning.    Pertinent studies were obtained, with results listed in chart above.  results interpreted as above.  Based on ED workup, findings are consistent with acute headache, sore throat, hypokalemia, hypomagnesemia.    Patient received 60 mg of p.o. potassium and 2 runs of 10 mill equivalents IV and her potassium came up to 3.3.  Patient was also given a gram of magnesium  sulfate and then she was given a migraine cocktail with the  Benadryl  Decadron Reglan  and Toradol .  On reevaluation, symptoms are improved.  Clinical Assessment/Plan: Patient presented to the ER with severe headache that was similar to her migraine headaches but she also had  a sore throat.  She was negative for COVID flu RSV or strep.  We ended up giving her a migraine cocktail with Toradol  Reglan  Decadron and Benadryl , and her headache significantly improved.  Patient felt with her lab workup showed that her potassium was 2.6 and her magnesium  was 1.6.  We ended up giving her several doses of p.o. and IV potassium along with some IV magnesium  as well to replenish those electrolytes and patient is notably sent home with some Fioricet to take as needed for her headaches and then we will follow her up with her primary care doctor as an outpatient.  She was told to make sure to get her potassium level checked and magnesium  level checked again in the near future.  Discussion of management or test interpretation with external provider(s): No external providers needed.    Clinical Complexity/Risk   Patient's presentation is most consistent with acute presentation with potential threat to life or bodily function.  Patient's impaired access to primary care increases the complexity of managing their  presentation with headache and sore throat.    Provider time spent in patient care today, inclusive of but not limited to clinical reassessment, review of diagnostic studies, and discharge preparation, was greater than 30 minutes.  OTC medications: yes, Tylenol  as needed Prescription medications discussed: yes, Fioricet  ED Clinical Impression   Diagnoses that have been ruled out:  None  Diagnoses that are still under consideration:  None  Final diagnoses:  Acute nonintractable headache, unspecified headache type  Sore throat  Hypokalemia  Hypomagnesemia    ED Assessment/Plan   ED Disposition     ED Disposition  Discharge   Condition  Stable    Comment  --         DISCHARGE MEDICATIONS   Medication List     ASK your doctor about these medications    amLODIPine  10 mg tablet Commonly known as: NORVASC  Take 10 mg by mouth Once Daily.   cyclobenzaprine  5 mg tablet Commonly known as: FLEXERIL  Take 5 mg by mouth 3 (three) times a day as needed for muscle spasms.   diclofenac  sodium 1 % gel Commonly known as: VOLTAREN  Apply 2 g topically 2 (two) times a day as needed.   lisinopriL  10 mg tablet Commonly known as: PRINIVIL  Take 10 mg by mouth Once Daily.   magnesium  oxide 400 mg (241 mg magnesium ) Tab Take 400 mg by mouth 2 (two) times a day for 30 days.   metFORMIN  500 mg tablet Commonly known as: GLUCOPHAGE  Take 1,000 mg by mouth 2 (two) times a day with meals.   potassium chloride  10 mEq ER tablet Commonly known as: KLOR-CON  Take 20 mEq by mouth Once Daily for 14 days.        FOLLOW UP Elveria Sharl Jacquet, MD [address] [place] [address] [phone]  In 2 days For Recheck   ____________________________ Scribe's Attestation: This document serves as a record of services personally performed by Rock Birmingham, MD. It was created on their behalf by Deatrice VEAR Bathe, Scribe, a trained medical scribe. The creation of this record is the provider's dictation and/or activities during the visit.   Electronically signed by: Rock Dannielle Birmingham, MD 10/07/2024 7:04 AM      *Some images could not be shown.

## 2024-10-08 ENCOUNTER — Other Ambulatory Visit: Payer: Self-pay | Admitting: Student

## 2024-10-08 ENCOUNTER — Ambulatory Visit: Payer: Self-pay | Admitting: Student

## 2024-10-08 ENCOUNTER — Other Ambulatory Visit: Payer: Self-pay

## 2024-10-08 VITALS — BP 150/95 | HR 73 | Temp 97.6°F | Ht 62.0 in | Wt 162.0 lb

## 2024-10-08 DIAGNOSIS — Z79899 Other long term (current) drug therapy: Secondary | ICD-10-CM | POA: Diagnosis not present

## 2024-10-08 DIAGNOSIS — Z8249 Family history of ischemic heart disease and other diseases of the circulatory system: Secondary | ICD-10-CM

## 2024-10-08 DIAGNOSIS — I10 Essential (primary) hypertension: Secondary | ICD-10-CM

## 2024-10-08 DIAGNOSIS — F320 Major depressive disorder, single episode, mild: Secondary | ICD-10-CM | POA: Diagnosis not present

## 2024-10-08 DIAGNOSIS — I152 Hypertension secondary to endocrine disorders: Secondary | ICD-10-CM

## 2024-10-08 MED ORDER — ESCITALOPRAM OXALATE 10 MG PO TABS
10.0000 mg | ORAL_TABLET | Freq: Every day | ORAL | 2 refills | Status: AC
  Filled 2024-10-08: qty 30, 30d supply, fill #0

## 2024-10-08 NOTE — Assessment & Plan Note (Addendum)
 Hx of depression; previously on Zoloft , discontinued due to nausea and irritability. Switched to Lexapro , but has not been taking it. Today reports depressed mood, poor sleep, and fatigue; denies SI/HI. PHQ score is 5, will like to restart her antidepressant.  Plan: - Restart Lexapro  10 mg daily. -Counseled on delayed onset (6-8 weeks) for therapeutic effect.

## 2024-10-08 NOTE — Progress Notes (Signed)
 Internal Medicine Clinic Attending  Case discussed with the resident at the time of the visit.  We reviewed the resident's history and exam and pertinent patient test results.  I agree with the assessment, diagnosis, and plan of care documented in the resident's note.

## 2024-10-08 NOTE — Patient Instructions (Addendum)
 It was a pleasure taking care of you today!    1.  Your blood pressure is still uncontrolled, I have sent a referral for a sleep study, and a repeat CT imaging of your kidneys. Someone will call to help you get that set up. - Please continue to take : - spironolactone  50 mg daily,  - amlodipine -olmesartan  10-40mg    2.  For depressed mood, I have sent Lexapro  10 mg to the pharmacy.    I have ordered the following labs for you:  Lab Orders  No laboratory test(s) ordered today      Follow up: 2 weeks with our pharmacist to help with your HTN, and get some labs. 4 weeks FU in clinic  Should you have any questions or concerns please call the internal medicine clinic at [phone].     Missy Sandhoff, MD  [facility]

## 2024-10-08 NOTE — Assessment & Plan Note (Signed)
 Assessment & Plan: Hx of resistant HTN and hypokalemia; prior CT renal (4/25) showed left adrenal nodule.  Aldo/renin ratio was elevated in 2022. BP remains elevated today. Sleep study referral pending. Likely multifactorial HTN; r/o secondary cause. Plan: - CT abdomen/pelvis w/ contrast to reassess adrenal nodule. - amlodipine -olmesartan  10/40 mg daily; spironolactone  50 mg daily. - 2 weeks follow-up with pharmacy to assist with blood pressure. - advised home blood pressure monitoring. - referred for sleep study.

## 2024-10-08 NOTE — Assessment & Plan Note (Deleted)
 History of uncontrolled hypertension.  BP today is elevated.  Denies chest pain or palpitation.  amlodipine -olmesartan  10-40mg   spironolactone  to 50mg  daily   Addendum: There is a 12 mm left adrenal nodule noted on her CT hematuria workup scan from 03/28/2024. Would consider dedicated adrenal CT scan and depending on the results, a referral to endocrinology for confirmatory testing and potentially a referral to surgery for management of this nodule.

## 2024-10-08 NOTE — Progress Notes (Signed)
 CC: ED follow up  HPI:  [name] is a 58 y.o. female living with a history stated below and presents today for  today for follow up.   Was seen in ED 10/19  with complaints of a sore throat, worsening right-sided headache for the last week.    She was found to be hypokalemic, otherwise she was negative for COVID, flu, RSV.  Potassium and magnesium  replenished, discharged home with Fioricet. Says headache is better this morning.  Denies double vision, weakness or chest pain.  Please see problem based assessment and plan for additional details.  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 90 tablet 3   atorvastatin  (LIPITOR) 40 MG tablet Take 1 tablet (40 mg total) by mouth daily. 90 tablet 3   blood glucose meter kit and supplies Dispense based on patient and insurance preference. Use up to four times daily as directed. (FOR ICD-10 E10.9, E11.9). 1 each 0   Blood Glucose Monitoring Suppl (FREESTYLE LITE) w/Device KIT 1 each by Does not apply route daily. 1 kit 1   cetirizine -pseudoephedrine  (ZYRTEC -D) 5-120 MG tablet Take 1 tablet by mouth daily. 30 tablet 0   diclofenac  Sodium (VOLTAREN ) 1 % GEL APPLY 4 GRAMS TOPICALLY 4 TIMES DAILY 350 g 0   fluticasone  (FLONASE ) 50 MCG/ACT nasal spray Place 1 spray into both nostrils daily. 15.8 mL 0   glucose blood (FREESTYLE LITE) test strip Use as instructed 100 each 12   Lancets (FREESTYLE) lancets Use as instructed 100  each 12   metFORMIN  (GLUCOPHAGE ) 1000 MG tablet Take 1 tablet (1,000 mg total) by mouth 2 (two) times daily with a meal. 180 tablet 3   OZEMPIC , 1 MG/DOSE, 4 MG/3ML SOPN INJECT 1MG  INTO THE SKIN ONCE WEEKLY 9 mL 0   [DISCONTINUED] lisinopril -hydrochlorothiazide  (PRINZIDE ) 20-12.5 MG per tablet Take 2 tablets by mouth daily. 60 tablet 11   No current facility-administered medications on file prior to visit.    Review of Systems: ROS negative except for what is noted on the assessment and plan.  Vitals:   10/08/24 0843 10/08/24 0918  BP: (!) 148/102 (!) 150/95  Pulse: 74 73  Temp: 97.6 F (36.4 C)   TempSrc: Oral   SpO2: 98%   Weight: 73.5 kg   Height: 5' 2 (1.575 m)       Physical Exam: Constitutional: NAD Cardiovascular: RRR, no murmurs. Pulmonary/Chest: Clear bilateral lungs Neuro: Normal neuroexam, no focal weakness.  Assessment & Plan:   Patient discussed with Dr. Lovie  Assessment & Plan Hypertension due to endocrine disorder Assessment & Plan: Hx of resistant HTN and hypokalemia; prior CT renal (4/25) showed left adrenal nodule.  Aldo/renin ratio was elevated in 2022. BP remains elevated today. Sleep study referral pending. Likely multifactorial HTN; r/o secondary cause. Plan: - CT abdomen/pelvis w/ contrast to reassess adrenal nodule. - amlodipine -olmesartan  10/40 mg daily; spironolactone  50 mg daily. - 2 weeks follow-up with pharmacy to assist with blood  pressure. - advised home blood pressure monitoring. - referred for sleep study.  Current mild episode of major depressive disorder, unspecified whether recurrent Hx of depression; previously on Zoloft , discontinued due to nausea and irritability. Switched to Lexapro , but has not been taking it. Today reports depressed mood, poor sleep, and fatigue; denies SI/HI. PHQ score is 5, will like to restart her antidepressant.  Plan: - Restart Lexapro  10 mg daily. -Counseled on delayed onset (6-8 weeks) for  therapeutic effect.  Orders Placed This Encounter  Procedures   AMB Referral [facility] Care Management   Nocturnal polysomnography (NPSG)    Missy Sandhoff, MD [facility] Internal Medicine, PGY-2  Date 10/08/2024 Time 11:18 AM

## 2024-10-08 NOTE — Progress Notes (Deleted)
 CC: ***  HPI:  Ashley Huynh is a 58 y.o. female living with a history stated below and presents today for follow up.   Was seen in ED 10/19  with complaints of a sore throat, worsening right-sided headache for the last week.    She was found to be hypokalemic, otherwise she was negative for COVID, flu, RSV.  Discharged home with Fioricet.   Please see problem based assessment and plan for additional details.  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 90 tablet 3   atorvastatin  (LIPITOR) 40 MG tablet Take 1 tablet (40 mg total) by mouth daily. 90 tablet 3   blood glucose meter kit and supplies Dispense based on patient and insurance preference. Use up to four times daily as directed. (FOR ICD-10 E10.9, E11.9). 1 each 0   Blood Glucose Monitoring Suppl (FREESTYLE LITE) w/Device KIT 1 each by Does not apply route daily. 1 kit 1   cetirizine -pseudoephedrine  (ZYRTEC -D) 5-120 MG tablet Take 1 tablet by mouth daily. 30 tablet 0   diclofenac  Sodium (VOLTAREN ) 1 % GEL APPLY 4 GRAMS TOPICALLY 4 TIMES DAILY 350 g 0   fluticasone  (FLONASE ) 50 MCG/ACT nasal spray Place 1 spray into both nostrils daily. 15.8 mL 0   glucose blood (FREESTYLE LITE) test strip Use as instructed 100 each 12   Lancets (FREESTYLE) lancets Use as instructed 100 each 12   metFORMIN  (GLUCOPHAGE ) 1000 MG tablet Take 1 tablet (1,000 mg total) by mouth 2 (two) times daily with a meal. 180 tablet 3    OZEMPIC , 1 MG/DOSE, 4 MG/3ML SOPN INJECT 1MG  INTO THE SKIN ONCE WEEKLY 9 mL 0   [DISCONTINUED] lisinopril -hydrochlorothiazide  (PRINZIDE ) 20-12.5 MG per tablet Take 2 tablets by mouth daily. 60 tablet 11   No current facility-administered medications on file prior to visit.    Review of Systems: ROS negative except for what is noted on the assessment and plan.  Vitals:   10/08/24 0843  BP: (!) 148/102  Pulse: 74  Temp: 97.6 F (36.4 C)  TempSrc: Oral  SpO2: 98%  Weight: 162 lb (73.5 kg)  Height: 5' 2 (1.575 m)   {Labs (Optional):23779} {Vitals History (Optional):23777}  Physical Exam: Constitutional: NAD Cardiovascular: RRR, no murmurs. Pulmonary/Chest: Clear bilateral lungs Abdominal: soft, non-tender, non-distended.  Assessment & Plan:   Patient {GC/GE:[id number]::discussed with,seen with} Dr. {WJFZD:[id number]::Tpoopjfd,Z. Hoffman,Chambliss, Winfrey,Lau,Machen}  Assessment & Plan Hypertension due to endocrine disorder History of uncontrolled hypertension.  BP today is elevated.  Denies chest pain or palpitation.  amlodipine -olmesartan  10-40mg   spironolactone  to 50mg  daily   Addendum: There is a 12 mm left adrenal nodule noted on her CT hematuria workup scan from 03/28/2024. Would consider dedicated adrenal CT scan and depending on the results, a referral to endocrinology for confirmatory testing and potentially a referral to surgery for management of this nodule.  No orders of the defined types were placed in this encounter.   Missy Sandhoff, MD [facility] Internal Medicine,  PGY-2  Date 10/08/2024 Time 8:51 AM

## 2024-10-11 ENCOUNTER — Telehealth: Payer: Self-pay

## 2024-10-11 NOTE — Progress Notes (Signed)
 Care Guide Pharmacy Note  10/11/2024 Name: [name] MRN: [id number] DOB: 1966-05-02  Referred By: Heddy Barren, DO Reason for referral: Complex Care Management and Call Attempt #1 (Unsuccessful initial outreach to schedule with PHARM D- Layna. )   Ashley Huynh is a 58 y.o. year old female who is a primary care patient of Heddy, Roya, DO.  Seraya M Wedge was referred to the pharmacist for assistance related to: HTN  An unsuccessful telephone outreach was attempted today to contact the patient who was referred to the pharmacy team for assistance with Disease management. Additional attempts will be made to contact the patient.  Leotis Rase [facility], [facility] Guide  Direct Dial: [phone]  Fax [phone]

## 2024-10-17 ENCOUNTER — Ambulatory Visit: Admitting: *Deleted

## 2024-10-17 DIAGNOSIS — Z0130 Encounter for examination of blood pressure without abnormal findings: Secondary | ICD-10-CM

## 2024-10-17 NOTE — Progress Notes (Addendum)
    Ashley Huynh presented today for blood pressure check. Patient is prescribed blood pressure medications and I confirmed that patient did take their blood pressure medication prior to today's appointment. Blood pressure was taken in the usual and appropriate manner using an automated BP cuff.     Vitals:   10/17/24 1000 10/17/24 1009  BP: (!) 148/91 (!) 142/90      Results of today's visit will be routed to [employer] for review and further management.

## 2024-11-21 ENCOUNTER — Other Ambulatory Visit: Payer: Self-pay

## 2024-12-24 ENCOUNTER — Ambulatory Visit: Admitting: Obstetrics

## 2025-01-16 ENCOUNTER — Ambulatory Visit: Admitting: Obstetrics

## 2025-03-22 ENCOUNTER — Ambulatory Visit: Admitting: Obstetrics
